# Patient Record
Sex: Female | Born: 1955
Health system: Southern US, Community
[De-identification: ages and names within clinical notes are randomized; demographics above are authoritative.]

## PROBLEM LIST (undated history)

## (undated) DIAGNOSIS — E119 Type 2 diabetes mellitus without complications: Secondary | ICD-10-CM

## (undated) DIAGNOSIS — M1990 Unspecified osteoarthritis, unspecified site: Secondary | ICD-10-CM

## (undated) DIAGNOSIS — H53132 Sudden visual loss, left eye: Secondary | ICD-10-CM

## (undated) DIAGNOSIS — M545 Low back pain, unspecified: Secondary | ICD-10-CM

## (undated) DIAGNOSIS — I10 Essential (primary) hypertension: Secondary | ICD-10-CM

## (undated) DIAGNOSIS — E7849 Other hyperlipidemia: Secondary | ICD-10-CM

## (undated) DIAGNOSIS — M5431 Sciatica, right side: Secondary | ICD-10-CM

## (undated) DIAGNOSIS — E669 Obesity, unspecified: Secondary | ICD-10-CM

## (undated) DIAGNOSIS — E11319 Type 2 diabetes mellitus with unspecified diabetic retinopathy without macular edema: Secondary | ICD-10-CM

## (undated) DIAGNOSIS — I639 Cerebral infarction, unspecified: Secondary | ICD-10-CM

## (undated) DIAGNOSIS — E785 Hyperlipidemia, unspecified: Secondary | ICD-10-CM

## (undated) DIAGNOSIS — G8929 Other chronic pain: Secondary | ICD-10-CM

## (undated) DIAGNOSIS — M549 Dorsalgia, unspecified: Secondary | ICD-10-CM

## (undated) HISTORY — DX: Other chronic pain: G89.29

## (undated) HISTORY — DX: Cerebral infarction, unspecified: I63.9

## (undated) HISTORY — DX: Unspecified osteoarthritis, unspecified site: M19.90

## (undated) HISTORY — DX: Other hyperlipidemia: E78.49

## (undated) HISTORY — DX: Sudden visual loss, left eye: H53.132

## (undated) HISTORY — PX: OTHER SURGICAL HISTORY: SHX169

## (undated) HISTORY — DX: Dorsalgia, unspecified: M54.9

## (undated) HISTORY — DX: Obesity, unspecified: E66.9

## (undated) HISTORY — DX: Sciatica, right side: M54.31

## (undated) HISTORY — DX: Type 2 diabetes mellitus without complications: E11.9

## (undated) HISTORY — DX: Hyperlipidemia, unspecified: E78.5

## (undated) HISTORY — DX: Essential (primary) hypertension: I10

## (undated) HISTORY — DX: Type 2 diabetes mellitus with unspecified diabetic retinopathy without macular edema: E11.319

## (undated) HISTORY — DX: Low back pain, unspecified: M54.50

---
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
## 2001-03-06 ENCOUNTER — Emergency Department (HOSPITAL_COMMUNITY): Admission: EM | Admit: 2001-03-06 | Discharge: 2001-03-06 | Payer: Self-pay | Admitting: Emergency Medicine

## 2001-03-06 ENCOUNTER — Encounter: Payer: Self-pay | Admitting: Emergency Medicine

## 2001-10-21 ENCOUNTER — Ambulatory Visit (HOSPITAL_COMMUNITY): Admission: RE | Admit: 2001-10-21 | Discharge: 2001-10-21 | Payer: Self-pay | Admitting: Pulmonary Disease

## 2002-12-14 ENCOUNTER — Other Ambulatory Visit: Admission: RE | Admit: 2002-12-14 | Discharge: 2002-12-14 | Payer: Self-pay | Admitting: Obstetrics and Gynecology

## 2002-12-22 ENCOUNTER — Encounter: Payer: Self-pay | Admitting: Obstetrics and Gynecology

## 2002-12-22 ENCOUNTER — Encounter: Admission: RE | Admit: 2002-12-22 | Discharge: 2002-12-22 | Payer: Self-pay | Admitting: Obstetrics and Gynecology

## 2003-09-03 ENCOUNTER — Ambulatory Visit (HOSPITAL_COMMUNITY): Admission: RE | Admit: 2003-09-03 | Discharge: 2003-09-03 | Payer: Self-pay | Admitting: Pulmonary Disease

## 2003-12-06 ENCOUNTER — Ambulatory Visit (HOSPITAL_COMMUNITY): Admission: RE | Admit: 2003-12-06 | Discharge: 2003-12-06 | Payer: Self-pay | Admitting: Orthopedic Surgery

## 2004-01-15 ENCOUNTER — Other Ambulatory Visit: Admission: RE | Admit: 2004-01-15 | Discharge: 2004-01-15 | Payer: Self-pay | Admitting: Obstetrics and Gynecology

## 2004-10-12 HISTORY — PX: COLONOSCOPY: SHX174

## 2004-10-12 HISTORY — PX: ESOPHAGOGASTRODUODENOSCOPY: SHX1529

## 2005-09-01 LAB — HM COLONOSCOPY

## 2006-01-27 ENCOUNTER — Emergency Department (HOSPITAL_COMMUNITY): Admission: EM | Admit: 2006-01-27 | Discharge: 2006-01-28 | Payer: Self-pay | Admitting: Emergency Medicine

## 2006-11-29 ENCOUNTER — Emergency Department (HOSPITAL_COMMUNITY): Admission: EM | Admit: 2006-11-29 | Discharge: 2006-11-29 | Payer: Self-pay | Admitting: Emergency Medicine

## 2007-03-08 ENCOUNTER — Emergency Department (HOSPITAL_COMMUNITY): Admission: EM | Admit: 2007-03-08 | Discharge: 2007-03-08 | Payer: Self-pay | Admitting: Emergency Medicine

## 2007-03-16 ENCOUNTER — Ambulatory Visit (HOSPITAL_COMMUNITY): Admission: RE | Admit: 2007-03-16 | Discharge: 2007-03-16 | Payer: Self-pay | Admitting: Pulmonary Disease

## 2007-04-18 ENCOUNTER — Encounter: Admission: RE | Admit: 2007-04-18 | Discharge: 2007-04-18 | Payer: Self-pay | Admitting: Endocrinology

## 2008-01-10 ENCOUNTER — Ambulatory Visit: Payer: Self-pay | Admitting: Family Medicine

## 2008-01-10 ENCOUNTER — Encounter (INDEPENDENT_AMBULATORY_CARE_PROVIDER_SITE_OTHER): Payer: Self-pay | Admitting: *Deleted

## 2008-01-10 DIAGNOSIS — M1990 Unspecified osteoarthritis, unspecified site: Secondary | ICD-10-CM | POA: Insufficient documentation

## 2008-01-10 DIAGNOSIS — G5600 Carpal tunnel syndrome, unspecified upper limb: Secondary | ICD-10-CM

## 2008-01-10 DIAGNOSIS — E1159 Type 2 diabetes mellitus with other circulatory complications: Secondary | ICD-10-CM

## 2008-01-10 DIAGNOSIS — I10 Essential (primary) hypertension: Secondary | ICD-10-CM | POA: Insufficient documentation

## 2008-01-10 DIAGNOSIS — E782 Mixed hyperlipidemia: Secondary | ICD-10-CM

## 2008-01-18 LAB — CONVERTED CEMR LAB
AST: 16 units/L (ref 0–37)
Albumin: 3.9 g/dL (ref 3.5–5.2)
BUN: 11 mg/dL (ref 6–23)
Bilirubin, Direct: 0.1 mg/dL (ref 0.0–0.3)
Calcium: 8.8 mg/dL (ref 8.4–10.5)
Chloride: 99 meq/L (ref 96–112)
Creatinine, Ser: 0.6 mg/dL (ref 0.4–1.2)
Direct LDL: 139.6 mg/dL
GFR calc Af Amer: 135 mL/min
Glucose, Bld: 327 mg/dL — ABNORMAL HIGH (ref 70–99)
HDL: 35.4 mg/dL — ABNORMAL LOW (ref >39.0)
Hgb A1c MFr Bld: 12.3 % — ABNORMAL HIGH (ref 4.6–6.0)
Microalb, Ur: 1.5 mg/dL (ref 0.0–1.9)
Total Bilirubin: 0.8 mg/dL (ref 0.3–1.2)
Total Protein: 7.5 g/dL (ref 6.0–8.3)
Triglycerides: 170 mg/dL — ABNORMAL HIGH (ref 0–149)

## 2008-01-25 ENCOUNTER — Ambulatory Visit: Payer: Self-pay | Admitting: Family Medicine

## 2008-01-31 ENCOUNTER — Telehealth: Payer: Self-pay | Admitting: Family Medicine

## 2008-02-01 ENCOUNTER — Ambulatory Visit: Payer: Self-pay | Admitting: Family Medicine

## 2008-02-23 ENCOUNTER — Telehealth: Payer: Self-pay | Admitting: Family Medicine

## 2008-03-16 ENCOUNTER — Telehealth: Payer: Self-pay | Admitting: Family Medicine

## 2008-04-26 ENCOUNTER — Ambulatory Visit: Payer: Self-pay | Admitting: Family Medicine

## 2008-04-26 LAB — CONVERTED CEMR LAB
AST: 27 units/L (ref 0–37)
Alkaline Phosphatase: 44 units/L (ref 39–117)
CO2: 28 meq/L (ref 19–32)
Calcium: 9 mg/dL (ref 8.4–10.5)
Cholesterol: 164 mg/dL (ref 0–200)
GFR calc non Af Amer: 111 mL/min
Hgb A1c MFr Bld: 9.3 % — ABNORMAL HIGH (ref 4.6–6.0)
LDL Cholesterol: 117 mg/dL — ABNORMAL HIGH (ref 0–99)
Sodium: 136 meq/L (ref 135–145)
Triglycerides: 61 mg/dL (ref 0–149)

## 2008-05-02 ENCOUNTER — Ambulatory Visit: Payer: Self-pay | Admitting: Family Medicine

## 2008-05-02 LAB — CONVERTED CEMR LAB: Cholesterol, target level: 200 mg/dL

## 2008-05-04 ENCOUNTER — Telehealth: Payer: Self-pay | Admitting: Family Medicine

## 2008-05-04 ENCOUNTER — Encounter (INDEPENDENT_AMBULATORY_CARE_PROVIDER_SITE_OTHER): Payer: Self-pay | Admitting: *Deleted

## 2008-05-07 ENCOUNTER — Ambulatory Visit (HOSPITAL_COMMUNITY): Admission: RE | Admit: 2008-05-07 | Discharge: 2008-05-07 | Payer: Self-pay | Admitting: Pulmonary Disease

## 2008-06-15 ENCOUNTER — Telehealth: Payer: Self-pay | Admitting: Family Medicine

## 2008-07-26 ENCOUNTER — Ambulatory Visit: Payer: Self-pay | Admitting: Family Medicine

## 2008-07-31 ENCOUNTER — Ambulatory Visit (HOSPITAL_COMMUNITY): Admission: RE | Admit: 2008-07-31 | Discharge: 2008-07-31 | Payer: Self-pay | Admitting: Pulmonary Disease

## 2008-08-02 LAB — CONVERTED CEMR LAB
BUN: 9 mg/dL (ref 6–23)
CO2: 30 meq/L (ref 19–32)
Calcium: 8.8 mg/dL (ref 8.4–10.5)
GFR calc Af Amer: 113 mL/min
Glucose, Bld: 129 mg/dL — ABNORMAL HIGH (ref 70–99)
HDL: 37.9 mg/dL — ABNORMAL LOW (ref >39.0)
LDL Cholesterol: 111 mg/dL — ABNORMAL HIGH (ref 0–99)
Potassium: 3.9 meq/L (ref 3.5–5.1)
Sodium: 138 meq/L (ref 135–145)
Total Bilirubin: 0.7 mg/dL (ref 0.3–1.2)
VLDL: 24 mg/dL (ref 0–40)

## 2008-08-08 ENCOUNTER — Ambulatory Visit: Payer: Self-pay | Admitting: Family Medicine

## 2008-10-31 ENCOUNTER — Ambulatory Visit: Payer: Self-pay | Admitting: Family Medicine

## 2008-11-05 LAB — CONVERTED CEMR LAB
ALT: 19 units/L (ref 0–35)
AST: 16 units/L (ref 0–37)
Albumin: 3.6 g/dL (ref 3.5–5.2)
BUN: 9 mg/dL (ref 6–23)
Bilirubin, Direct: 0.1 mg/dL (ref 0.0–0.3)
CO2: 29 meq/L (ref 19–32)
Calcium: 8.6 mg/dL (ref 8.4–10.5)
Creatinine, Ser: 0.6 mg/dL (ref 0.4–1.2)
GFR calc Af Amer: 134 mL/min
Glucose, Bld: 186 mg/dL — ABNORMAL HIGH (ref 70–99)
Sodium: 138 meq/L (ref 135–145)
Total CHOL/HDL Ratio: 6

## 2008-11-06 ENCOUNTER — Ambulatory Visit: Payer: Self-pay | Admitting: Family Medicine

## 2009-01-09 ENCOUNTER — Encounter: Admission: RE | Admit: 2009-01-09 | Discharge: 2009-01-09 | Payer: Self-pay | Admitting: Family Medicine

## 2009-01-10 ENCOUNTER — Encounter (INDEPENDENT_AMBULATORY_CARE_PROVIDER_SITE_OTHER): Payer: Self-pay | Admitting: *Deleted

## 2009-01-30 ENCOUNTER — Ambulatory Visit: Payer: Self-pay | Admitting: Family Medicine

## 2009-02-01 LAB — CONVERTED CEMR LAB
Alkaline Phosphatase: 43 units/L (ref 39–117)
BUN: 10 mg/dL (ref 6–23)
Chloride: 104 meq/L (ref 96–112)
Creatinine, Ser: 0.6 mg/dL (ref 0.4–1.2)
GFR calc non Af Amer: 133.98 mL/min (ref >60)
Glucose, Bld: 82 mg/dL (ref 70–99)
Hgb A1c MFr Bld: 8.4 % — ABNORMAL HIGH (ref 4.6–6.5)
Microalb Creat Ratio: 3.1 mg/g (ref 0.0–30.0)
Total Bilirubin: 0.9 mg/dL (ref 0.3–1.2)
Total CHOL/HDL Ratio: 6
Total Protein: 7.1 g/dL (ref 6.0–8.3)

## 2009-02-06 ENCOUNTER — Ambulatory Visit: Payer: Self-pay | Admitting: Family Medicine

## 2009-02-06 DIAGNOSIS — F341 Dysthymic disorder: Secondary | ICD-10-CM

## 2009-02-12 ENCOUNTER — Encounter: Payer: Self-pay | Admitting: Family Medicine

## 2009-02-12 ENCOUNTER — Telehealth: Payer: Self-pay | Admitting: Family Medicine

## 2009-02-20 ENCOUNTER — Ambulatory Visit (HOSPITAL_COMMUNITY): Payer: Self-pay | Admitting: Psychiatry

## 2009-02-25 ENCOUNTER — Ambulatory Visit (HOSPITAL_COMMUNITY): Payer: Self-pay | Admitting: Psychiatry

## 2009-03-06 ENCOUNTER — Ambulatory Visit: Payer: Self-pay | Admitting: Family Medicine

## 2009-03-07 ENCOUNTER — Ambulatory Visit (HOSPITAL_COMMUNITY): Payer: Self-pay | Admitting: Psychiatry

## 2009-03-18 ENCOUNTER — Ambulatory Visit (HOSPITAL_COMMUNITY): Payer: Self-pay | Admitting: Psychiatry

## 2009-03-27 ENCOUNTER — Ambulatory Visit (HOSPITAL_COMMUNITY): Payer: Self-pay | Admitting: Psychiatry

## 2009-04-09 ENCOUNTER — Ambulatory Visit (HOSPITAL_COMMUNITY): Payer: Self-pay | Admitting: Psychiatry

## 2009-04-23 ENCOUNTER — Ambulatory Visit (HOSPITAL_COMMUNITY): Payer: Self-pay | Admitting: Psychiatry

## 2009-05-01 ENCOUNTER — Ambulatory Visit: Payer: Self-pay | Admitting: Family Medicine

## 2009-05-01 LAB — CONVERTED CEMR LAB
ALT: 20 units/L (ref 0–35)
CO2: 31 meq/L (ref 19–32)
Calcium: 8.7 mg/dL (ref 8.4–10.5)
Chloride: 107 meq/L (ref 96–112)
Creatinine, Ser: 0.6 mg/dL (ref 0.4–1.2)
Direct LDL: 160.1 mg/dL
GFR calc non Af Amer: 133.86 mL/min (ref >60)
Hgb A1c MFr Bld: 8.7 % — ABNORMAL HIGH (ref 4.6–6.5)
Total Protein: 7 g/dL (ref 6.0–8.3)
VLDL: 27.2 mg/dL (ref 0.0–40.0)

## 2009-05-08 ENCOUNTER — Ambulatory Visit: Payer: Self-pay | Admitting: Family Medicine

## 2009-05-14 ENCOUNTER — Ambulatory Visit (HOSPITAL_COMMUNITY): Payer: Self-pay | Admitting: Psychiatry

## 2009-05-29 ENCOUNTER — Ambulatory Visit (HOSPITAL_COMMUNITY): Payer: Self-pay | Admitting: Psychiatry

## 2009-06-14 ENCOUNTER — Ambulatory Visit (HOSPITAL_COMMUNITY): Payer: Self-pay | Admitting: Psychiatry

## 2009-07-01 ENCOUNTER — Ambulatory Visit (HOSPITAL_COMMUNITY): Payer: Self-pay | Admitting: Psychiatry

## 2009-07-22 ENCOUNTER — Ambulatory Visit (HOSPITAL_COMMUNITY): Payer: Self-pay | Admitting: Psychiatry

## 2009-08-02 ENCOUNTER — Ambulatory Visit: Payer: Self-pay | Admitting: Family Medicine

## 2009-08-05 LAB — CONVERTED CEMR LAB
AST: 20 units/L (ref 0–37)
Albumin: 3.8 g/dL (ref 3.5–5.2)
BUN: 9 mg/dL (ref 6–23)
Bilirubin, Direct: 0 mg/dL (ref 0.0–0.3)
CO2: 28 meq/L (ref 19–32)
Chloride: 102 meq/L (ref 96–112)
GFR calc non Af Amer: 133.73 mL/min (ref >60)
Glucose, Bld: 118 mg/dL — ABNORMAL HIGH (ref 70–99)
Sodium: 138 meq/L (ref 135–145)
Total Bilirubin: 0.8 mg/dL (ref 0.3–1.2)
Total CHOL/HDL Ratio: 4

## 2009-08-07 ENCOUNTER — Ambulatory Visit: Payer: Self-pay | Admitting: Family Medicine

## 2009-08-09 ENCOUNTER — Ambulatory Visit (HOSPITAL_COMMUNITY): Payer: Self-pay | Admitting: Psychiatry

## 2009-08-22 ENCOUNTER — Telehealth: Payer: Self-pay | Admitting: Family Medicine

## 2009-08-23 ENCOUNTER — Ambulatory Visit (HOSPITAL_COMMUNITY): Payer: Self-pay | Admitting: Psychiatry

## 2009-09-04 ENCOUNTER — Ambulatory Visit (HOSPITAL_COMMUNITY): Payer: Self-pay | Admitting: Psychiatry

## 2009-09-25 ENCOUNTER — Ambulatory Visit (HOSPITAL_COMMUNITY): Payer: Self-pay | Admitting: Psychiatry

## 2009-10-09 ENCOUNTER — Ambulatory Visit (HOSPITAL_COMMUNITY): Payer: Self-pay | Admitting: Psychiatry

## 2009-10-10 ENCOUNTER — Ambulatory Visit (HOSPITAL_COMMUNITY): Admission: RE | Admit: 2009-10-10 | Discharge: 2009-10-10 | Payer: Self-pay | Admitting: Pulmonary Disease

## 2009-11-01 ENCOUNTER — Telehealth: Payer: Self-pay | Admitting: Family Medicine

## 2009-11-08 ENCOUNTER — Encounter: Payer: Self-pay | Admitting: Family Medicine

## 2009-11-08 ENCOUNTER — Ambulatory Visit: Payer: Self-pay | Admitting: Family Medicine

## 2009-11-15 ENCOUNTER — Ambulatory Visit: Payer: Self-pay | Admitting: Family Medicine

## 2009-12-10 ENCOUNTER — Ambulatory Visit (HOSPITAL_COMMUNITY): Payer: Self-pay | Admitting: Psychiatry

## 2009-12-26 ENCOUNTER — Ambulatory Visit (HOSPITAL_COMMUNITY): Payer: Self-pay | Admitting: Psychiatry

## 2010-01-10 ENCOUNTER — Ambulatory Visit (HOSPITAL_COMMUNITY): Admission: RE | Admit: 2010-01-10 | Discharge: 2010-01-10 | Payer: Self-pay | Admitting: Pulmonary Disease

## 2010-01-10 ENCOUNTER — Encounter: Payer: Self-pay | Admitting: Family Medicine

## 2010-01-20 ENCOUNTER — Ambulatory Visit (HOSPITAL_COMMUNITY): Admission: RE | Admit: 2010-01-20 | Discharge: 2010-01-20 | Payer: Self-pay | Admitting: Pulmonary Disease

## 2010-01-20 ENCOUNTER — Encounter: Payer: Self-pay | Admitting: Family Medicine

## 2010-02-26 ENCOUNTER — Ambulatory Visit: Payer: Self-pay | Admitting: Family Medicine

## 2010-03-05 LAB — CONVERTED CEMR LAB
ALT: 18 units/L (ref 0–35)
Albumin: 4.1 g/dL (ref 3.5–5.2)
BUN: 10 mg/dL (ref 6–23)
Bilirubin, Direct: 0.1 mg/dL (ref 0.0–0.3)
Cholesterol: 224 mg/dL — ABNORMAL HIGH (ref 0–200)
Creatinine, Ser: 0.7 mg/dL (ref 0.4–1.2)
Creatinine,U: 196.7 mg/dL
Direct LDL: 147.7 mg/dL
HDL: 41.2 mg/dL (ref >39.00)
Hgb A1c MFr Bld: 9.1 % — ABNORMAL HIGH (ref 4.6–6.5)
Microalb Creat Ratio: 1 mg/g (ref 0.0–30.0)
Microalb, Ur: 2 mg/dL — ABNORMAL HIGH (ref 0.0–1.9)
Sodium: 139 meq/L (ref 135–145)
Total Bilirubin: 0.3 mg/dL (ref 0.3–1.2)
Total CHOL/HDL Ratio: 5
VLDL: 43.6 mg/dL — ABNORMAL HIGH (ref 0.0–40.0)

## 2010-03-07 ENCOUNTER — Ambulatory Visit: Payer: Self-pay | Admitting: Family Medicine

## 2010-03-14 ENCOUNTER — Encounter: Payer: Self-pay | Admitting: Family Medicine

## 2010-03-28 ENCOUNTER — Telehealth: Payer: Self-pay | Admitting: Family Medicine

## 2010-04-09 ENCOUNTER — Ambulatory Visit (HOSPITAL_COMMUNITY): Payer: Self-pay | Admitting: Psychiatry

## 2010-04-18 ENCOUNTER — Ambulatory Visit (HOSPITAL_COMMUNITY): Admission: RE | Admit: 2010-04-18 | Discharge: 2010-04-18 | Payer: Self-pay | Admitting: Pulmonary Disease

## 2010-04-23 ENCOUNTER — Ambulatory Visit (HOSPITAL_COMMUNITY): Payer: Self-pay | Admitting: Psychiatry

## 2010-04-24 ENCOUNTER — Telehealth: Payer: Self-pay | Admitting: Family Medicine

## 2010-04-28 ENCOUNTER — Ambulatory Visit: Payer: Self-pay | Admitting: Family Medicine

## 2010-04-29 LAB — CONVERTED CEMR LAB
Albumin: 3.8 g/dL (ref 3.5–5.2)
Alkaline Phosphatase: 45 units/L (ref 39–117)
BUN: 13 mg/dL (ref 6–23)
Bilirubin, Direct: 0.1 mg/dL (ref 0.0–0.3)
CO2: 34 meq/L — ABNORMAL HIGH (ref 19–32)
Pro B Natriuretic peptide (BNP): 27.2 pg/mL (ref 0.0–100.0)
Total Protein: 7 g/dL (ref 6.0–8.3)

## 2010-05-06 ENCOUNTER — Ambulatory Visit (HOSPITAL_COMMUNITY): Payer: Self-pay | Admitting: Psychiatry

## 2010-05-21 ENCOUNTER — Ambulatory Visit: Payer: Self-pay | Admitting: Family Medicine

## 2010-05-23 LAB — CONVERTED CEMR LAB
Albumin: 3.4 g/dL — ABNORMAL LOW (ref 3.5–5.2)
Bilirubin, Direct: 0.1 mg/dL (ref 0.0–0.3)
CO2: 32 meq/L (ref 19–32)
Cholesterol: 202 mg/dL — ABNORMAL HIGH (ref 0–200)
Direct LDL: 141.4 mg/dL
HDL: 39.4 mg/dL (ref >39.00)
Hgb A1c MFr Bld: 10 % — ABNORMAL HIGH (ref 4.6–6.5)
Total CHOL/HDL Ratio: 5
Triglycerides: 141 mg/dL (ref 0.0–149.0)
VLDL: 28.2 mg/dL (ref 0.0–40.0)

## 2010-05-26 ENCOUNTER — Ambulatory Visit (HOSPITAL_COMMUNITY): Payer: Self-pay | Admitting: Psychiatry

## 2010-05-28 ENCOUNTER — Ambulatory Visit: Payer: Self-pay | Admitting: Family Medicine

## 2010-05-28 ENCOUNTER — Other Ambulatory Visit: Admission: RE | Admit: 2010-05-28 | Discharge: 2010-05-28 | Payer: Self-pay | Admitting: Family Medicine

## 2010-05-28 LAB — CONVERTED CEMR LAB: Pap Smear: NORMAL

## 2010-06-05 ENCOUNTER — Encounter (INDEPENDENT_AMBULATORY_CARE_PROVIDER_SITE_OTHER): Payer: Self-pay | Admitting: *Deleted

## 2010-06-05 LAB — CONVERTED CEMR LAB: Pap Smear: NEGATIVE

## 2010-11-13 NOTE — Letter (Signed)
Summary: Out of Work  [facility] at [facility]  [address] [place], [place] [address]   Phone: [phone]  Fax: [phone]    January 10, 2008   Employee:  Addilyn A Borboa    To Whom It May Concern:   For Medical reasons, please excuse the above named employee from work for the following dates:  Start:   01/10/2008  End:   01/10/2008  If you need additional information, please feel free to contact our office.         Sincerely,    Excell Seltzer, M.D.  AEB:lsf

## 2010-11-13 NOTE — Letter (Signed)
Summary: Out of Work  [facility] at [facility]  [address] [place], [place] [address]   Phone: [phone]  Fax: [phone]    November 08, 2009   Employee:  Tajah A Linker    To Whom It May Concern:   For Medical reasons, please excuse the above named employee from work for the following dates:  Start:   11/08/2009  End:   11/10/2009  If you need additional information, please feel free to contact our office.         Sincerely,    Linde Gillis CMA (AAMA)

## 2010-11-13 NOTE — Letter (Signed)
Summary: Results Follow up Letter  [place] at [facility]  [address] [place], [place] [address]   Phone: [phone]  Fax: [phone]    06/05/2010 MRN: [id number]     Stacy [name] [address] [place], [place]  [address]    Dear [name],  The following are the results of your recent test(s):  Test         Result    Pap Smear:        Normal ___x__  Not Normal _____ Comments:Repeat in 1 year ______________________________________________________ Cholesterol: LDL(Bad cholesterol):         Your goal is less than:         HDL (Good cholesterol):       Your goal is more than: Comments:  ______________________________________________________ Mammogram:        Normal _____  Not Normal _____ Comments:  ___________________________________________________________________ Hemoccult:        Normal _____  Not normal _______ Comments:    _____________________________________________________________________ Other Tests:    We routinely do not discuss normal results over the telephone.  If you desire a copy of the results, or you have any questions about this information we can discuss them at your next office visit.   Sincerely,  Kerby Nora MD

## 2010-11-13 NOTE — Letter (Signed)
Summary: Surgical Clearance/[facility]  Surgical Clearance/[facility]   Imported By: Lanelle Bal 03/20/2010 10:17:50  _____________________________________________________________________  External Attachment:    Type:   Image     Comment:   External Document

## 2010-11-13 NOTE — Progress Notes (Signed)
Summary: swelling in feet and legs  Phone Note Call from Patient Call back at [phone]   Caller: Patient Call For: Kerby Nora MD Summary of Call: Patient says that she has extreme swelling in legs and feet. She thinks this is because of the actos. She wants to know if she should try something different. Uses [facility] in [place].  Initial call taken by: Melody Comas,  April 24, 2010 1:18 PM  Follow-up for Phone Call        Please make her an appt to be seen... not sure swelling is related to actos.   Go to ER if SOB.  Follow-up by: Kerby Nora MD,  April 25, 2010 8:57 AM  Additional Follow-up for Phone Call Additional follow up Details #1::        Left message on cell phone [phone] for patient to return call.  DeShannon Smith CMA Duncan Dull)  April 25, 2010 3:22 PM   Advised pt, appt made for monday, pt knows to go to ER if sob. Additional Follow-up by: Lowella Petties CMA,  April 25, 2010 3:29 PM

## 2010-11-13 NOTE — Assessment & Plan Note (Signed)
Summary: CPX/DLO   Vital Signs:  Patient profile:   55 year old female Height:      62 inches Weight:      195.8 pounds BMI:     35.94 Temp:     98.2 degrees F oral Pulse rate:   76 / minute Pulse rhythm:   regular BP sitting:   140 / 90  (left arm) Cuff size:   large  Vitals Entered By: Benny Lennert CMA Duncan Dull) (Mar 07, 2010 8:10 AM)  History of Present Illness: Chief complaint cpx with out pap or breast exam because of reputured disc in back.Consuello Masse CMA   DM, poor control..A1C continues to be high.  Continues to be noncompliant with diet. On Lantus 45 UNits two times a day, Januvia daily. Fasting blood sugars...140-202,  post prandial only occ checks 160. no low CBGs.  Did not tolerate metformin well.  3/29 acute back pain with radicular symptoms. Saw Dr Juanetta Gosling previous MD. ... plain films show grade 1 slippage L4-L5 Minimal improvement...pain med. MRI 4/11... moderate severe spinal stenosis L4-L5  and disc herniation. Referred to Dr. Mikal Plane...treated with steroid taper...minimal improvement. Pain in buttocks radiating to legs currently..has been out of work since. Planning epidural injections...awaiting to be scheduled for these.  Lipid Management History:      Positive NCEP/ATP III risk factors include female age 36 years old or older, diabetes, and hypertension.  Negative NCEP/ATP III risk factors include non-tobacco-user status.        The patient states that she knows about the "Therapeutic Lifestyle Change" diet.  Her compliance with the TLC diet is poor.  Adjunctive measures started by the patient include aerobic exercise, fiber, sitostanol esters, limit alcohol consumpton, and weight reduction.  Comments include: Not taking meds regualrly..only occ taking crestor..was previously controlled with this med. .  The patient denies any symptoms to suggest myopathy or liver disease.     Problems Prior to Update: 1)  Sinusitis - Acute-nos  (ICD-461.9) 2)   Rotator Cuff Injury, Left Shoulder  (ICD-726.10) 3)  Skin Lesions, Multiple  (ICD-709.9) 4)  Anxiety Depression  (ICD-300.4) 5)  Cough  (ICD-786.2) 6)  Carpal Tunnel Syndrome, Bilateral  (ICD-354.0) 7)  Hypertension  (ICD-401.9) 8)  Hyperlipidemia  (ICD-272.4) 9)  Diabetes Mellitus, Type II  (ICD-250.00) 10)  Osteoarthritis  (ICD-715.90)  Current Medications (verified): 1)  Hydrochlorothiazide 25 Mg  Tabs (Hydrochlorothiazide) .... Take 1 Tablet By Mouth Once A Day 2)  Vitamin C 500 Mg  Tabs (Ascorbic Acid) .... Take 1 Tablet By Mouth Once A Day 3)  Vitamin E 600 Unit  Caps (Vitamin E) .... Take 1 Capsule By Mouth Once A Day 4)  Baby Aspirin 81 Mg  Chew (Aspirin) .... Take 1 Tablet By Mouth Once A Day 5)  Lantus Solostar 100 Unit/ml  Soln (Insulin Glargine) .... 45 Units Bid Dx 250.00 6)  Freestyle Test   Strp (Glucose Blood) .... Check Cbg Three Times Daily 7)  Crestor 20 Mg Tabs (Rosuvastatin Calcium) .Marland Kitchen.. 1 Tab By Mouth Daily 8)  Januvia 100 Mg Tabs (Sitagliptin Phosphate) .Marland Kitchen.. 1 Tab By Mouth Daily 9)  Nexium 40 Mg Cpdr (Esomeprazole Magnesium) .... Take 1 Tablet By Mouth Once A Day 10)  Bd Pen Needle Ultrafine 29g X 12.18mm Misc (Insulin Pen Needle) .... Give Insulin Two Times A Day  Dx 250.00 11)  Diclofenac Sodium 75 Mg Tbec (Diclofenac Sodium) .Marland Kitchen.. 1 Tab By Mouth Two Times A Day 12)  Sertraline Hcl 25 Mg  Tabs (Sertraline Hcl) .... Take 1 Tablet By Mouth Once A Day 13)  Alprazolam 0.25 Mg Tabs (Alprazolam) .Marland Kitchen.. 1 Tab By Mouth Daily As Needed Anxiety 14)  Amlodipine Besy-Benazepril Hcl 5-20 Mg Caps (Amlodipine Besy-Benazepril Hcl) .... Take One Tablet By Mouth Daily  Allergies (verified): No Known Drug Allergies  Past History:  Past medical, surgical, family and social histories (including risk factors) reviewed, and no changes noted (except as noted below).  Past Medical History: Reviewed history from 01/10/2008 and no changes required. Osteoarthritis Diabetes mellitus, type  II, Dx 2000 Hyperlipidemia Hypertension  Past Surgical History: Reviewed history from 01/10/2008 and no changes required. left wrist surgery 2006  Family History: Reviewed history from 01/10/2008 and no changes required. father: CAD, DM,  mother: CAD, CABG age 15, DM, arhtritis, HTN brother: ? PGM: CAD MGM: DM on insulin no cancer  Social History: Reviewed history from 01/10/2008 and no changes required. 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 CV:  Denies chest pain or discomfort. Resp:  Denies shortness of breath. GI:  Denies abdominal pain. GU:  Denies dysuria.  Physical Exam  General:  obese appearing femla ein NAD Mouth:  MMM Neck:  no carotid bruit or thyromegaly no cervical or supraclavicular lymphadenopathy  Lungs:  Normal respiratory effort, chest expands symmetrically. Lungs are clear to auscultation, no crackles or wheezes. Heart:  Normal rate and regular rhythm. S1 and S2 normal without gallop, murmur, click, rub or other extra sounds. Abdomen:  Bowel sounds positive,abdomen soft and non-tender without masses, organomegaly or hernias noted. Pulses:  R and L posterior tibial pulses are full and equal bilaterally  Extremities:    Varicosities at lower thigh, calves mildy tender to touch  Diabetes Management Exam:    Foot Exam (with socks and/or shoes not present):       Sensory-Pinprick/Light touch:          Left medial foot (L-4): normal          Left dorsal foot (L-5): normal          Left lateral foot (S-1): normal          Right medial foot (L-4): normal          Right dorsal foot (L-5): normal          Right lateral foot (S-1): normal       Sensory-Monofilament:          Left foot: normal          Right foot: normal       Inspection:          Left foot: abnormal              Comments: several red papules on sole of foot and  between toes..consistent with flea bite/nbug bites...no obvious infection treat with cortisone cream two times a day.           Right foot: normal       Nails:          Left foot: normal          Right foot: normal   Impression & Recommendations:  Problem # 1:  DIABETES MELLITUS, TYPE II (ICD-250.00)  Very poor control. pt remains very resistant. She seems to be waiting for everything else in her life to be stable before getting diabetes  and weight under control.  Counseled pt >30 min face to face..Pt already notified per report.  her she will have more issues...new microalbuminuria this year etc...if she keeps waiting.  Agree to increase Lantus given fastings now poorly controlled and start sctos daily.  Her updated medication list for this problem includes:    Baby Aspirin 81 Mg Chew (Aspirin) .Marland Kitchen... Take 1 tablet by mouth once a day    Lantus Solostar 100 Unit/ml Soln (Insulin glargine) .Marland KitchenMarland KitchenMarland KitchenMarland Kitchen 45 units bid dx 250.00    Januvia 100 Mg Tabs (Sitagliptin phosphate) .Marland Kitchen... 1 tab by mouth daily    Amlodipine Besy-benazepril Hcl 5-20 Mg Caps (Amlodipine besy-benazepril hcl) .Marland Kitchen... Take one tablet by mouth daily    Actos 30 Mg Tabs (Pioglitazone hcl) .Marland Kitchen... Take 1 tablet by mouth once a day  Labs Reviewed: Creat: 0.7 (02/26/2010)     Last Eye Exam: normal (12/10/2008) Reviewed HgBA1c results: 9.1 (02/26/2010)  8.5 (11/08/2009)  Problem # 2:  HYPERTENSION (ICD-401.9)  poor control given pain at this time. OCntinue to follow.  Her updated medication list for this problem includes:    Hydrochlorothiazide 25 Mg Tabs (Hydrochlorothiazide) .Marland Kitchen... Take 1 tablet by mouth once a day    Amlodipine Besy-benazepril Hcl 5-20 Mg Caps (Amlodipine besy-benazepril hcl) .Marland Kitchen... Take one tablet by mouth daily  BP today: 140/90 Prior BP: 130/90 (11/15/2009)  10 Yr Risk Heart Disease: 27 % Prior 10 Yr Risk Heart Disease: 24 % (08/07/2009)  Labs  Reviewed: K+: 4.2 (02/26/2010) Creat: : 0.7 (02/26/2010)   Chol: 224 (02/26/2010)   HDL: 41.20 (02/26/2010)   LDL: 102 (08/02/2009)   TG: 218.0 (02/26/2010)  Problem # 3:  HYPERLIPIDEMIA (ICD-272.4) needs to eb more compliant with medicaiton.  Her updated medication list for this problem includes:    Crestor 20 Mg Tabs (Rosuvastatin calcium) .Marland Kitchen... 1 tab by mouth daily  Labs Reviewed: SGOT: 15 (02/26/2010)   SGPT: 18 (02/26/2010)  Lipid Goals: Chol Goal: 200 (05/02/2008)   HDL Goal: 40 (05/02/2008)   LDL Goal: 100 (05/02/2008)   TG Goal: 150 (05/02/2008)  10 Yr Risk Heart Disease: 27 % Prior 10 Yr Risk Heart Disease: 24 % (08/07/2009)   HDL:41.20 (02/26/2010), 37.00 (08/02/2009)  LDL:102 (08/02/2009), 126 (01/30/2009)  Chol:224 (02/26/2010), 158 (08/02/2009)  Trig:218.0 (02/26/2010), 95.0 (08/02/2009)  Problem # 4:  Preventive Health Care (ICD-V70.0) Assessment: Comment Only Unable to do [facility] today given back pain...will reschedule.   Complete Medication List: 1)  Hydrochlorothiazide 25 Mg Tabs (Hydrochlorothiazide) .... Take 1 tablet by mouth once a day 2)  Vitamin C 500 Mg Tabs (Ascorbic acid) .... Take 1 tablet by mouth once a day 3)  Vitamin E 600 Unit Caps (Vitamin e) .... Take 1 capsule by mouth once a day 4)  Baby Aspirin 81 Mg Chew (Aspirin) .... Take 1 tablet by mouth once a day 5)  Lantus Solostar 100 Unit/ml Soln (Insulin glargine) .... 45 units bid dx 250.00 6)  Freestyle Test Strp (Glucose blood) .... Check cbg three times daily 7)  Crestor 20 Mg Tabs (Rosuvastatin calcium) .Marland Kitchen.. 1 tab by mouth daily 8)  Januvia 100 Mg Tabs (Sitagliptin phosphate) .Marland Kitchen.. 1 tab by mouth daily 9)  Nexium 40 Mg Cpdr (Esomeprazole magnesium) .... Take 1 tablet by mouth once a day 10)  Bd Pen Needle Ultrafine 29g X 12.24mm Misc (Insulin pen needle) .... Give insulin two times a day  dx 250.00 11)  Diclofenac Sodium 75 Mg Tbec (Diclofenac sodium) .Marland Kitchen.. 1 tab by mouth two times  a day 12)  Sertraline  Hcl 25 Mg Tabs (Sertraline hcl) .... Take 1 tablet by mouth once a day 13)  Alprazolam 0.25 Mg Tabs (Alprazolam) .Marland Kitchen.. 1 tab by mouth daily as needed anxiety 14)  Amlodipine Besy-benazepril Hcl 5-20 Mg Caps (Amlodipine besy-benazepril hcl) .... Take one tablet by mouth daily 15)  Actos 30 Mg Tabs (Pioglitazone hcl) .... Take 1 tablet by mouth once a day  Lipid Assessment/Plan:      Based on NCEP/ATP III, the patient's risk factor category is "history of diabetes".  The patient's lipid goals are as follows: Total cholesterol goal is 200; LDL cholesterol goal is 100; HDL cholesterol goal is 40; Triglyceride goal is 150.  Her LDL cholesterol goal has not been met.  Secondary causes for hyperlipidemia have been ruled out.  She has been counseled on adjunctive measures for lowering her cholesterol and has been provided with dietary instructions.    Patient Instructions: 1)  Increase Lantus to 47 Units twice daily.  2)  Reschedule CPX in 3 months. 3)  BMP prior to visit, ICD-9: 250.00 4)  Hepatic Panel prior to visit ICD-9:  5)  Lipid panel prior to visit ICD-9 :  6)  HgBA1c prior to visit  ICD-9:  7)  Start back crestor.  8)  Start actos 30 mg daily.  Prescriptions: ACTOS 30 MG TABS (PIOGLITAZONE HCL) Take 1 tablet by mouth once a day  #30 x 11   Entered and Authorized by:   Kerby Nora MD   Signed by:   Kerby Nora MD on 03/07/2010   Method used:   Electronically to        [facility] [phone]* (retail)       [address]       [place], [place]  [address]       Ph: [phone] or [phone]       Fax: [phone]   RxID:   [id number]   Current Allergies (reviewed today): No known allergies   Last PAP:  Normal (05/13/2007 8:51:13 AM) PAP Next Due:  Refused

## 2010-11-13 NOTE — Assessment & Plan Note (Signed)
Summary: FEET AND LEGS SWELLING   Vital Signs:  Patient profile:   55 year old female Weight:      199.25 pounds Temp:     98.9 degrees F oral Pulse rate:   86 / minute Pulse rhythm:   regular BP sitting:   154 / 80  (right arm) Cuff size:   large  Vitals Entered By: Janee Morn CMA (April 28, 2010 12:27 PM) CC: Leg and foot Edema   History of Present Illness: 54 year old female:  3 weeks, had soem epidural steroid injections and then really started to swell up then.  Did start high after injections  New med - actos 47 units two times a day of lantus  tthe patient presents with bilateral diffuse fatigue and lower extremity swelling has been ongoing for the last week or so.  She is discontinued multiple of her medications including her Actos which was recently started.  Patient also discontinued her hydrochlorothiazide, and she is now hypertensive. She started taking some Lasix on her own. Discontinue her hydrochlorothiazide because she felt as if she was not getting enough diuresis and concern about  swelling.   Poorly controlled diabetes.   Allergies (verified): No Known Drug Allergies  Past History:  Past medical, surgical, family and social histories (including risk factors) reviewed, and no changes noted (except as noted below).  Past Medical History: Reviewed history from 01/10/2008 and no changes required. Osteoarthritis Diabetes mellitus, type II, Dx 2000 Hyperlipidemia Hypertension  Past Surgical History: Reviewed history from 01/10/2008 and no changes required. left wrist surgery 2006  Family History: Reviewed history from 01/10/2008 and no changes required. father: CAD, DM,  mother: CAD, CABG age 71, DM, arhtritis, HTN brother: ? PGM: CAD MGM: DM on insulin no cancer  Social History: Reviewed history from 01/10/2008 and no changes required. 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 CV:  Complains of swelling of feet; denies chest pain or discomfort, difficulty breathing while lying down, palpitations, and shortness of breath with exertion.  Physical Exam  General:  Well-developed,well-nourished,in no acute distress; alert,appropriate and cooperative throughout examination Head:  Normocephalic and atraumatic without obvious abnormalities. No apparent alopecia or balding. Ears:  no external deformities.   Nose:  no external deformity.   Lungs:  normal respiratory effort.   Extremities:  3+ pedal edema and 2 + LE edema Neurologic:  alert & oriented X3 and gait normal.   Cervical Nodes:  No lymphadenopathy noted Psych:  Cognition and judgment appear intact. Alert and cooperative with normal attention span and concentration. No apparent delusions, illusions, hallucinations   Impression & Recommendations:  Problem # 1:  EDEMA (ICD-782.3) Assessment New eval BNP, BMP, liver  most likely from Actos  The following medications were removed from the medication list:    Hydrochlorothiazide 25 Mg Tabs (Hydrochlorothiazide) .Marland Kitchen... Take 1 tablet by mouth once a day Her updated medication list for this problem includes:    Hydrochlorothiazide 25 Mg Tabs (Hydrochlorothiazide) .Marland Kitchen... 1 by mouth daily  Orders: Venipuncture ([address]) TLB-BMP (Basic Metabolic Panel-BMET) (80048-METABOL) TLB-Hepatic/Liver Function Pnl (80076-HEPATIC) TLB-BNP (B-Natriuretic Peptide) (83880-BNPR)  Problem # 2:  HYPERTENSION (ICD-401.9) Assessment: New restart HCTZ  The following medications were removed from the medication list:    Hydrochlorothiazide 25 Mg Tabs (Hydrochlorothiazide) .Marland Kitchen... Take 1 tablet by mouth once a day Her  updated medication list for this problem includes:    Amlodipine Besy-benazepril Hcl 5-20 Mg Caps (Amlodipine besy-benazepril  hcl) .Marland Kitchen... Take one tablet by mouth daily    Hydrochlorothiazide 25 Mg Tabs (Hydrochlorothiazide) .Marland Kitchen... 1 by mouth daily  Problem # 3:  DIABETES MELLITUS, TYPE II (ICD-250.00) Patient will need more insulin.  If two times a day necessary vs. lantus + novolog, then 70/30 would be an option to think about  f/u with Dr. Ermalene Searing  The following medications were removed from the medication list:    Actos 30 Mg Tabs (Pioglitazone hcl) .Marland Kitchen... Take 1 tablet by mouth once a day Her updated medication list for this problem includes:    Baby Aspirin 81 Mg Chew (Aspirin) .Marland Kitchen... Take 1 tablet by mouth once a day    Lantus Solostar 100 Unit/ml Soln (Insulin glargine) .Marland KitchenMarland KitchenMarland KitchenMarland Kitchen 45 units bid dx 250.00    Januvia 100 Mg Tabs (Sitagliptin phosphate) .Marland Kitchen... 1 tab by mouth daily    Amlodipine Besy-benazepril Hcl 5-20 Mg Caps (Amlodipine besy-benazepril hcl) .Marland Kitchen... Take one tablet by mouth daily  Complete Medication List: 1)  Baby Aspirin 81 Mg Chew (Aspirin) .... Take 1 tablet by mouth once a day 2)  Lantus Solostar 100 Unit/ml Soln (Insulin glargine) .... 45 units bid dx 250.00 3)  Crestor 20 Mg Tabs (Rosuvastatin calcium) .Marland Kitchen.. 1 tab by mouth daily 4)  Januvia 100 Mg Tabs (Sitagliptin phosphate) .Marland Kitchen.. 1 tab by mouth daily 5)  Nexium 40 Mg Cpdr (Esomeprazole magnesium) .... Take 1 tablet by mouth once a day 6)  Sertraline Hcl 25 Mg Tabs (Sertraline hcl) .... Take 1 tablet by mouth once a day 7)  Alprazolam 0.25 Mg Tabs (Alprazolam) .Marland Kitchen.. 1 tab by mouth daily as needed anxiety 8)  Amlodipine Besy-benazepril Hcl 5-20 Mg Caps (Amlodipine besy-benazepril hcl) .... Take one tablet by mouth daily 9)  Bd Pen Needle Ultrafine 29g X 12.80mm Misc (Insulin pen needle) .... Give insulin two times a day  dx 250.00 10)  Freestyle Test Strp (Glucose blood) .... Check cbg three times daily 11)  Hydrochlorothiazide 25 Mg Tabs (Hydrochlorothiazide) .Marland Kitchen.. 1 by mouth daily  Patient Instructions: 1)  RESTART HYDROCHLOROTHIAZIDE 25 MG A  DAY 2)  WILL TAKE SEVERAL WEEKS FOR SWELLING TO RECOVER 3)  WE WILL CALL IF THE LABS ARE ABNORMAL AND MORE NEEDS TO BE DONE 4)  f/u with Dr. Ermalene Searing in 3 weeks 5)  check blood sugars, write down, fasting and 2 hours after eating and bring in to her  Current Allergies (reviewed today): No known allergies

## 2010-11-13 NOTE — Assessment & Plan Note (Signed)
Summary: sick/alc   Vital Signs:  Patient profile:   55 year old female Weight:      199.50 pounds BMI:     36.62 Temp:     98.6 degrees F oral Pulse rate:   72 / minute Pulse rhythm:   regular BP sitting:   140 / 84  (left arm) Cuff size:   large  Vitals Entered By: Linde Gillis CMA [facility]) (November 08, 2009 4:05 PM)   Acute Visit History:      The patient complains of cough, earache, headache, sinus problems, and sore throat.  These symptoms began 1 week ago.  She denies chest pain and fever.  Other comments include: clear nasal discharge PND OTC mucinex DM.        The cough interferes with her sleep.  The character of the cough is described as productive.  There is no history of shortness of breath associated with her cough.        The earache is located on the right side.        She complains of sinus pressure, ears being blocked, and nasal congestion.        Problems Prior to Update: 1)  Rotator Cuff Injury, Left Shoulder  (ICD-726.10) 2)  Skin Lesions, Multiple  (ICD-709.9) 3)  Anxiety Depression  (ICD-300.4) 4)  Cough  (ICD-786.2) 5)  Carpal Tunnel Syndrome, Bilateral  (ICD-354.0) 6)  Hypertension  (ICD-401.9) 7)  Hyperlipidemia  (ICD-272.4) 8)  Diabetes Mellitus, Type II  (ICD-250.00) 9)  Osteoarthritis  (ICD-715.90)  Current Medications (verified): 1)  Hydrochlorothiazide 25 Mg  Tabs (Hydrochlorothiazide) .... Take 1 Tablet By Mouth Once A Day 2)  Vitamin C 500 Mg  Tabs (Ascorbic Acid) .... Take 1 Tablet By Mouth Once A Day 3)  Vitamin E 600 Unit  Caps (Vitamin E) .... Take 1 Capsule By Mouth Once A Day 4)  Baby Aspirin 81 Mg  Chew (Aspirin) .... Take 1 Tablet By Mouth Once A Day 5)  Lantus Solostar 100 Unit/ml  Soln (Insulin Glargine) .... 45 Units Bid Dx 250.00 6)  Freestyle Test   Strp (Glucose Blood) .... Check Cbg Three Times Daily 7)  Crestor 20 Mg Tabs (Rosuvastatin Calcium) .Marland Kitchen.. 1 Tab By Mouth Daily 8)  Januvia 100 Mg Tabs (Sitagliptin Phosphate) .Marland Kitchen.. 1 Tab  By Mouth Daily 9)  Nexium 40 Mg Cpdr (Esomeprazole Magnesium) .... Take 1 Tablet By Mouth Once A Day 10)  Bd Pen Needle Ultrafine 29g X 12.15mm Misc (Insulin Pen Needle) .... Give Insulin Two Times A Day  Dx 250.00 11)  Diclofenac Sodium 75 Mg Tbec (Diclofenac Sodium) .Marland Kitchen.. 1 Tab By Mouth Two Times A Day 12)  Sertraline Hcl 25 Mg Tabs (Sertraline Hcl) .... Take 1 Tablet By Mouth Once A Day 13)  Alprazolam 0.25 Mg Tabs (Alprazolam) .Marland Kitchen.. 1 Tab By Mouth Daily As Needed Anxiety 14)  Hydrocodone-Homatropine 5-1.5 Mg/78ml Syrp (Hydrocodone-Homatropine) .Marland Kitchen.. 1 Tsp By Mouth At Bedtime As Needed Cough 15)  Amlodipine Besy-Benazepril Hcl 5-20 Mg Caps (Amlodipine Besy-Benazepril Hcl) .... Take One Tablet By Mouth Daily 16)  Amoxicillin 500 Mg Caps (Amoxicillin) .... 2 Tab By Mouth Two Times A Day X 10 Days  Allergies (verified): No Known Drug Allergies  Past History:  Past medical, surgical, family and social histories (including risk factors) reviewed, and no changes noted (except as noted below).  Past Medical History: Reviewed history from 01/10/2008 and no changes required. Osteoarthritis Diabetes mellitus, type II, Dx 2000 Hyperlipidemia Hypertension  Past Surgical  History: Reviewed history from 01/10/2008 and no changes required. left wrist surgery 2006  Family History: Reviewed history from 01/10/2008 and no changes required. father: CAD, DM,  mother: CAD, CABG age 38, DM, arhtritis, HTN brother: ? PGM: CAD MGM: DM on insulin no cancer  Social History: Reviewed history from 01/10/2008 and no changes required. 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 CV:  Denies chest pain or discomfort. GI:  Denies abdominal pain.  Physical Exam  General:  obese appearing female in NAD Head:  B maxilalrty  sinus tpt Ears:  Clear fluid B TMs, no eryhtmea, no exudate Nose:  nasal dischargemucosal pallor.   Mouth:  Oral mucosa and oropharynx without lesions or exudates.  MMM Neck:  no carotid bruit or thyromegaly no cervical or supraclavicular lymphadenopathy  Lungs:  Normal respiratory effort, chest expands symmetrically. Lungs are clear to auscultation, no crackles or wheezes. Heart:  Normal rate and regular rhythm. S1 and S2 normal without gallop, murmur, click, rub or other extra sounds.   Impression & Recommendations:  Problem # 1:  SINUSITIS - ACUTE-NOS (ICD-461.9) Treat with antibiotics, nasal saline irrigation and mucinex. Cough suppressant at night for cough due to post nasal drip.  Her updated medication list for this problem includes:    Hydrocodone-homatropine 5-1.5 Mg/40ml Syrp (Hydrocodone-homatropine) .Marland Kitchen... 1 tsp by mouth at bedtime as needed cough    Amoxicillin 500 Mg Caps (Amoxicillin) .Marland Kitchen... 2 tab by mouth two times a day x 10 days  Complete Medication List: 1)  Hydrochlorothiazide 25 Mg Tabs (Hydrochlorothiazide) .... Take 1 tablet by mouth once a day 2)  Vitamin C 500 Mg Tabs (Ascorbic acid) .... Take 1 tablet by mouth once a day 3)  Vitamin E 600 Unit Caps (Vitamin e) .... Take 1 capsule by mouth once a day 4)  Baby Aspirin 81 Mg Chew (Aspirin) .... Take 1 tablet by mouth once a day 5)  Lantus Solostar 100 Unit/ml Soln (Insulin glargine) .... 45 units bid dx 250.00 6)  Freestyle Test Strp (Glucose blood) .... Check cbg three times daily 7)  Crestor 20 Mg Tabs (Rosuvastatin calcium) .Marland Kitchen.. 1 tab by mouth daily 8)  Januvia 100 Mg Tabs (Sitagliptin phosphate) .Marland Kitchen.. 1 tab by mouth daily 9)  Nexium 40 Mg Cpdr (Esomeprazole magnesium) .... Take 1 tablet by mouth once a day 10)  Bd Pen Needle Ultrafine 29g X 12.4mm Misc (Insulin pen needle) .... Give insulin two times a day  dx 250.00 11)  Diclofenac Sodium 75 Mg Tbec (Diclofenac sodium) .Marland Kitchen.. 1 tab by mouth two times a day 12)   Sertraline Hcl 25 Mg Tabs (Sertraline hcl) .... Take 1 tablet by mouth once a day 13)  Alprazolam 0.25 Mg Tabs (Alprazolam) .Marland Kitchen.. 1 tab by mouth daily as needed anxiety 14)  Hydrocodone-homatropine 5-1.5 Mg/68ml Syrp (Hydrocodone-homatropine) .Marland Kitchen.. 1 tsp by mouth at bedtime as needed cough 15)  Amlodipine Besy-benazepril Hcl 5-20 Mg Caps (Amlodipine besy-benazepril hcl) .... Take one tablet by mouth daily 16)  Amoxicillin 500 Mg Caps (Amoxicillin) .... 2 tab by mouth two times a day x 10 days  Patient Instructions: 1)  Mucinex, nasal saline irrigation 3-4 times a day. 2)  Take your antibiotic as prescribed until ALL of it is gone, but stop if you develop a rash or swelling and contact our office as soon as possible.  Prescriptions: AMOXICILLIN 500 MG CAPS (AMOXICILLIN) 2 tab by mouth two times a day x 10 days  #40 x 0   Entered and Authorized by:   Kerby Nora MD   Signed by:   Kerby Nora MD on 11/08/2009   Method used:   Print then Give to Patient   RxID:   [id number] HYDROCODONE-HOMATROPINE 5-1.5 MG/5ML SYRP (HYDROCODONE-HOMATROPINE) 1 tsp by mouth at bedtime as needed cough  #8 oz x 0   Entered and Authorized by:   Kerby Nora MD   Signed by:   Kerby Nora MD on 11/08/2009   Method used:   Print then Give to Patient   RxID:   [id number]   Current Allergies (reviewed today): No known allergies

## 2010-11-13 NOTE — Assessment & Plan Note (Signed)
Summary: CPX/JRR   Vital Signs:  Patient profile:   55 year old female Height:      62 inches Weight:      199.2 pounds BMI:     36.57 Temp:     98.6 degrees F oral Pulse rate:   84 / minute Pulse rhythm:   regular BP sitting:   140 / 96  (left arm) Cuff size:   large  Vitals Entered By: Benny Lennert CMA [facility]) (May 28, 2010 10:54 AM)  History of Present Illness: Chief complaint cpx   The patient is here for annual wellness exam and preventative care.    Noncompliant with medication regimen...has stopped all meds except insulin and HCTZ.   DM, poor control ON Lantus solostar Pen 47 Units two times a day... FBS...70-135 2 hour post prandial....140-200s Diet is better since not working.   High cholesterol, poor control  HTN, poor control  Hypertension History:      She denies headache, chest pain, palpitations, dyspnea with exertion, peripheral edema, neurologic problems, and side effects from treatment.        Positive major cardiovascular risk factors include female age 101 years old or older, diabetes, hyperlipidemia, and hypertension.  Negative major cardiovascular risk factors include non-tobacco-user status.    Lipid Management History:      Positive NCEP/ATP III risk factors include female age 24 years old or older, diabetes, HDL cholesterol less than 40, and hypertension.  Negative NCEP/ATP III risk factors include non-tobacco-user status.        The patient states that she knows about the "Therapeutic Lifestyle Change" diet.  Her compliance with the TLC diet is poor.      Problems Prior to Update: 1)  Routine Gynecological Examination  (ICD-V72.31) 2)  Physical Examination  (ICD-V70.0) 3)  Edema- Localized  (ICD-782.3) 4)  Edema  (ICD-782.3) 5)  Sinusitis - Acute-nos  (ICD-461.9) 6)  Rotator Cuff Injury, Left Shoulder  (ICD-726.10) 7)  Skin Lesions, Multiple  (ICD-709.9) 8)  Anxiety Depression  (ICD-300.4) 9)  Cough  (ICD-786.2) 10)  Carpal Tunnel  Syndrome, Bilateral  (ICD-354.0) 11)  Hypertension  (ICD-401.9) 12)  Hyperlipidemia  (ICD-272.4) 13)  Diabetes Mellitus, Type II  (ICD-250.00) 14)  Osteoarthritis  (ICD-715.90)  Current Medications (verified): 1)  Baby Aspirin 81 Mg  Chew (Aspirin) .... Take 1 Tablet By Mouth Once A Day 2)  Lantus Solostar 100 Unit/ml  Soln (Insulin Glargine) .... 45 Units Bid Dx 250.00 3)  Crestor 20 Mg Tabs (Rosuvastatin Calcium) .Marland Kitchen.. 1 Tab By Mouth Daily 4)  Januvia 100 Mg Tabs (Sitagliptin Phosphate) .Marland Kitchen.. 1 Tab By Mouth Daily 5)  Nexium 40 Mg Cpdr (Esomeprazole Magnesium) .... Take 1 Tablet By Mouth Once A Day 6)  Sertraline Hcl 25 Mg Tabs (Sertraline Hcl) .... Take 1 Tablet By Mouth Once A Day 7)  Alprazolam 0.25 Mg Tabs (Alprazolam) .Marland Kitchen.. 1 Tab By Mouth Daily As Needed Anxiety 8)  Amlodipine Besy-Benazepril Hcl 5-20 Mg Caps (Amlodipine Besy-Benazepril Hcl) .... Take One Tablet By Mouth Daily 9)  Bd Pen Needle Ultrafine 29g X 12.97mm Misc (Insulin Pen Needle) .... Give Insulin Two Times A Day  Dx 250.00 10)  Freestyle Test   Strp (Glucose Blood) .... Check Cbg Three Times Daily 11)  Hydrochlorothiazide 25 Mg Tabs (Hydrochlorothiazide) .Marland Kitchen.. 1 By Mouth Daily  Allergies (verified): No Known Drug Allergies  Past History:  Past medical, surgical, family and social histories (including risk factors) reviewed, and no changes noted (except as noted below).  Past Medical History: Reviewed history from 01/10/2008 and no changes required. Osteoarthritis Diabetes mellitus, type II, Dx 2000 Hyperlipidemia Hypertension  Past Surgical History: Reviewed history from 01/10/2008 and no changes required. left wrist surgery 2006  Family History: Reviewed history from 01/10/2008 and no changes required. father: CAD, DM,  mother: CAD, CABG age 97, DM, arhtritis, HTN brother: ? PGM: CAD MGM: DM on insulin no cancer  Social History: Reviewed history from 01/10/2008 and no changes required. 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 CV:  Denies chest pain or discomfort. Resp:  Denies shortness of breath. GI:  Denies abdominal pain. GU:  Denies dysuria.  Physical Exam  General:  Well-developed,well-nourished,in no acute distress; alert,appropriate and cooperative throughout examination Mouth:  MMM Neck:  no carotid bruit or thyromegaly no cervical or supraclavicular lymphadenopathy  Chest Wall:  No deformities, masses, or tenderness noted. Breasts:  No mass, nodules, thickening, tenderness, bulging, retraction, inflamation, nipple discharge or skin changes noted.   Lungs:  normal respiratory effort.   Heart:  Normal rate and regular rhythm. S1 and S2 normal without gallop, murmur, click, rub or other extra sounds. Abdomen:  Bowel sounds positive,abdomen soft and non-tender without masses, organomegaly or hernias noted. Genitalia:  Pelvic Exam:        External: normal female genitalia without lesions or masses        Vagina: normal without lesions or masses        Cervix: normal without lesions or masses        Adnexa: normal bimanual exam without masses or fullness        Uterus: normal by palpation        Pap smear: performed Pulses:  R and L posterior tibial pulses are full and equal bilaterally  Extremities:  no edema  Diabetes Management Exam:    Foot Exam (with socks and/or shoes not present):       Sensory-Pinprick/Light touch:          Left medial foot (L-4): normal          Left dorsal foot (L-5): normal          Left lateral foot (S-1): normal          Right medial foot (L-4): normal          Right dorsal foot (L-5): normal          Right lateral foot (S-1): normal       Sensory-Monofilament:          Left foot: normal          Right foot: normal       Inspection:           Left foot: normal          Right foot: normal       Nails:          Left foot: normal          Right foot: normal   Impression & Recommendations:  Problem # 1:  Preventive Health Care (ICD-V70.0) The patient's preventative maintenance and recommended screening tests for an annual wellness exam were reviewed in full today. Brought up to date unless services declined.  Counselled on the importance of diet, exercise, and its role in overall health and mortality. The patient's FH and SH was reviewed, including  their home life, tobacco status, and drug and alcohol status.     Problem # 2:  Gynecological examination-routine (ICD-V72.31) PAP pending.   Problem # 3:  HYPERTENSION (ICD-401.9)  Poor control. Resstart amlodipine benazapril and continue HCTZ. Her updated medication list for this problem includes:    Amlodipine Besy-benazepril Hcl 5-20 Mg Caps (Amlodipine besy-benazepril hcl) .Marland Kitchen... Take one tablet by mouth daily    Hydrochlorothiazide 25 Mg Tabs (Hydrochlorothiazide) .Marland Kitchen... 1 by mouth daily  BP today: 140/96 Prior BP: 154/80 (04/28/2010)  Prior 10 Yr Risk Heart Disease: 27 % (03/07/2010)  Labs Reviewed: K+: 4.6 (05/21/2010) Creat: : 0.7 (05/21/2010)   Chol: 202 (05/21/2010)   HDL: 39.40 (05/21/2010)   LDL: 102 (08/02/2009)   TG: 141.0 (05/21/2010)  Problem # 4:  HYPERLIPIDEMIA (ICD-272.4)  Poor control. Refuses statin for a while... WIll work on lifestyle change.  Reeval in 3 months. The following medications were removed from the medication list:    Crestor 20 Mg Tabs (Rosuvastatin calcium) .Marland Kitchen... 1 tab by mouth daily  Labs Reviewed: SGOT: 14 (05/21/2010)   SGPT: 15 (05/21/2010)  Lipid Goals: Chol Goal: 200 (05/02/2008)   HDL Goal: 40 (05/02/2008)   LDL Goal: 100 (05/02/2008)   TG Goal: 150 (05/02/2008)  Prior 10 Yr Risk Heart Disease: 27 % (03/07/2010)   HDL:39.40 (05/21/2010), 41.20 (02/26/2010)  LDL:102 (08/02/2009), 126 (01/30/2009)  Chol:202 (05/21/2010), 224  (02/26/2010)  Trig:141.0 (05/21/2010), 218.0 (02/26/2010)  Complete Medication List: 1)  Baby Aspirin 81 Mg Chew (Aspirin) .... Take 1 tablet by mouth once a day 2)  Lantus Solostar 100 Unit/ml Soln (Insulin glargine) .... 45 units bid dx 250.00 3)  Amlodipine Besy-benazepril Hcl 5-20 Mg Caps (Amlodipine besy-benazepril hcl) .... Take one tablet by mouth daily 4)  Bd Pen Needle Ultrafine 29g X 12.61mm Misc (Insulin pen needle) .... Give insulin two times a day  dx 250.00 5)  Freestyle Test Strp (Glucose blood) .... Check cbg three times daily 6)  Hydrochlorothiazide 25 Mg Tabs (Hydrochlorothiazide) .Marland Kitchen.. 1 by mouth daily  Hypertension Assessment/Plan:      The patient's hypertensive risk group is category C: Target organ damage and/or diabetes.  Her calculated 10 year risk of coronary heart disease is 27 %.  Today's blood pressure is 140/96.  Her blood pressure goal is < 130/80.  Lipid Assessment/Plan:      Based on NCEP/ATP III, the patient's risk factor category is "history of diabetes".  The patient's lipid goals are as follows: Total cholesterol goal is 200; LDL cholesterol goal is 100; HDL cholesterol goal is 40; Triglyceride goal is 150.  Her LDL cholesterol goal has not been met.  Secondary causes for hyperlipidemia have been ruled out.  She has been counseled on adjunctive measures for lowering her cholesterol and has been provided with dietary instructions.    Patient Instructions: 1)  Call to set up mammogram. 2)  Please schedule a follow-up appointment in 3 months 30 min OV. 3)  Restart amlodipine benazapril. 4)   Call if interested in mealtime insulin dose or 70/30 option.  5)  Please schedule a follow-up appointment in 3 months .  6)  BMP prior to visit, ICD-9: 250.00 7)  Hepatic Panel prior to visit ICD-9:  8)  Lipid panel prior to visit ICD-9 :  9)  HgBA1c prior to visit  ICD-9:  Prescriptions: HYDROCHLOROTHIAZIDE 25 MG TABS (HYDROCHLOROTHIAZIDE) 1 by mouth daily  #90 x 3    Entered and Authorized by:   Kerby Nora MD  Signed by:   Kerby Nora MD on 05/28/2010   Method used:   Electronically to        [facility]. [phone]* (retail)       [address]       [place], [place]  [address]       Ph: [phone] or [phone]       Fax: [phone]   RxID:   [id number] AMLODIPINE BESY-BENAZEPRIL HCL 5-20 MG CAPS (AMLODIPINE BESY-BENAZEPRIL HCL) take one tablet by mouth daily  #90 x 3   Entered and Authorized by:   Kerby Nora MD   Signed by:   Kerby Nora MD on 05/28/2010   Method used:   Electronically to        [facility] [phone]* (retail)       [address]       [place], [place]  [address]       Ph: [phone] or [phone]       Fax: [phone]   RxID:   [id number]   Current Allergies (reviewed today): No known allergies   Last PAP:  Normal (05/13/2007 8:51:13 AM) PAP Result Date:  05/28/2010 PAP Result:  normal PAP Next Due:  1 yr Last Mammogram:  ASSESSMENT: Negative - BI-RADS 1^MM DIGITAL SCREENING (01/09/2009 1:00:00 PM) Mammogram Next Due:  1 yr never had pneumonia vaccine...refused today

## 2010-11-13 NOTE — Assessment & Plan Note (Signed)
Summary: roa 30 min resch  cyd   Vital Signs:  Patient profile:   55 year old female Weight:      197.50 pounds BMI:     36.25 Temp:     97.8 degrees F oral Pulse rate:   76 / minute Pulse rhythm:   regular BP sitting:   130 / 90  (left arm) Cuff size:   large  Vitals Entered By: Linde Gillis CMA Duncan Dull) (November 15, 2009 9:32 AM) CC: 30 minute appt, f/u   History of Present Illness: DM, poor control..A1C back to inadequate baseline.  Continues to be noncompliant with diet. On LAntus 45 UNits two times a day, JAnuvia daily. Fasting blood sugars...98-119,  post prandial only occ checks 211. Did not tolerate metformin well.   BAck to work full time. Has upcoming appt with specialist left shoulder...sees him MONday.  Not currently exercising.   Problems Prior to Update: 1)  Rotator Cuff Injury, Left Shoulder  (ICD-726.10) 2)  Skin Lesions, Multiple  (ICD-709.9) 3)  Anxiety Depression  (ICD-300.4) 4)  Cough  (ICD-786.2) 5)  Carpal Tunnel Syndrome, Bilateral  (ICD-354.0) 6)  Hypertension  (ICD-401.9) 7)  Hyperlipidemia  (ICD-272.4) 8)  Diabetes Mellitus, Type II  (ICD-250.00) 9)  Osteoarthritis  (ICD-715.90)  Current Medications (verified): 1)  Hydrochlorothiazide 25 Mg  Tabs (Hydrochlorothiazide) .... Take 1 Tablet By Mouth Once A Day 2)  Vitamin C 500 Mg  Tabs (Ascorbic Acid) .... Take 1 Tablet By Mouth Once A Day 3)  Vitamin E 600 Unit  Caps (Vitamin E) .... Take 1 Capsule By Mouth Once A Day 4)  Baby Aspirin 81 Mg  Chew (Aspirin) .... Take 1 Tablet By Mouth Once A Day 5)  Lantus Solostar 100 Unit/ml  Soln (Insulin Glargine) .... 45 Units Bid Dx 250.00 6)  Freestyle Test   Strp (Glucose Blood) .... Check Cbg Three Times Daily 7)  Crestor 20 Mg Tabs (Rosuvastatin Calcium) .Marland Kitchen.. 1 Tab By Mouth Daily 8)  Januvia 100 Mg Tabs (Sitagliptin Phosphate) .Marland Kitchen.. 1 Tab By Mouth Daily 9)  Nexium 40 Mg Cpdr (Esomeprazole Magnesium) .... Take 1 Tablet By Mouth Once A Day 10)  Bd Pen  Needle Ultrafine 29g X 12.48mm Misc (Insulin Pen Needle) .... Give Insulin Two Times A Day  Dx 250.00 11)  Diclofenac Sodium 75 Mg Tbec (Diclofenac Sodium) .Marland Kitchen.. 1 Tab By Mouth Two Times A Day 12)  Sertraline Hcl 25 Mg Tabs (Sertraline Hcl) .... Take 1 Tablet By Mouth Once A Day 13)  Alprazolam 0.25 Mg Tabs (Alprazolam) .Marland Kitchen.. 1 Tab By Mouth Daily As Needed Anxiety 14)  Hydrocodone-Homatropine 5-1.5 Mg/93ml Syrp (Hydrocodone-Homatropine) .Marland Kitchen.. 1 Tsp By Mouth At Bedtime As Needed Cough 15)  Amoxicillin 500 Mg Caps (Amoxicillin) .... 2 Tab By Mouth Two Times A Day X 10 Days  Allergies (verified): No Known Drug Allergies  Past History:  Past medical, surgical, family and social histories (including risk factors) reviewed, and no changes noted (except as noted below).  Past Medical History: Reviewed history from 01/10/2008 and no changes required. Osteoarthritis Diabetes mellitus, type II, Dx 2000 Hyperlipidemia Hypertension  Past Surgical History: Reviewed history from 01/10/2008 and no changes required. left wrist surgery 2006  Family History: Reviewed history from 01/10/2008 and no changes required. father: CAD, DM,  mother: CAD, CABG age 10, DM, arhtritis, HTN brother: ? PGM: CAD MGM: DM on insulin no cancer  Social History: Reviewed history from 01/10/2008 and no changes required. works nightshift: Research scientist (physical sciences) Divorced 1  daughter: healthy Never Smoked Alcohol use-no Drug use-no Regular exercise-yes, occ walking Diet: eating on the run, fast food  Review of Systems General:  Denies fatigue and fever. CV:  Denies chest pain or discomfort. Resp:  Denies shortness of breath. GI:  Denies abdominal pain. GU:  Denies dysuria.  Physical Exam  General:  obese appearing femla ein NAD Head:  no maxillary sinus ttp Eyes:  No corneal or conjunctival inflammation noted. EOMI. Perrla. Funduscopic exam benign, without hemorrhages, exudates or papilledema. Vision grossly  normal. Ears:  External ear exam shows no significant lesions or deformities.  Otoscopic examination reveals clear canals, tympanic membranes are intact bilaterally without bulging, retraction, inflammation or discharge. Hearing is grossly normal bilaterally. Nose:  External nasal examination shows no deformity or inflammation. Nasal mucosa are pink and moist without lesions or exudates. Mouth:  MMM Neck:  no carotid bruit or thyromegaly no cervical or supraclavicular lymphadenopathy  Lungs:  Normal respiratory effort, chest expands symmetrically. Lungs are clear to auscultation, no crackles or wheezes. Heart:  Normal rate and regular rhythm. S1 and S2 normal without gallop, murmur, click, rub or other extra sounds. Pulses:  R and L posterior tibial pulses are full and equal bilaterally  Extremities:    Varicosities at lower thigh, calves mildy tender to touch  Diabetes Management Exam:    Foot Exam (with socks and/or shoes not present):       Sensory-Pinprick/Light touch:          Left medial foot (L-4): normal          Left dorsal foot (L-5): normal          Left lateral foot (S-1): normal          Right medial foot (L-4): normal          Right dorsal foot (L-5): normal          Right lateral foot (S-1): normal       Sensory-Monofilament:          Left foot: normal          Right foot: normal       Inspection:          Left foot: normal          Right foot: normal       Nails:          Left foot: normal          Right foot: normal   Impression & Recommendations:  Problem # 1:  HYPERTENSION (ICD-401.9)  Well controlled at home . Continue current medication.  Her updated medication list for this problem includes:    Hydrochlorothiazide 25 Mg Tabs (Hydrochlorothiazide) .Marland Kitchen... Take 1 tablet by mouth once a day  BP today: 130/90 Prior BP: 140/80 (08/07/2009)  Prior 10 Yr Risk Heart Disease: 24 % (08/07/2009)  Labs Reviewed: K+: 4.1 (08/02/2009) Creat: : 0.6 (08/02/2009)    Chol: 158 (08/02/2009)   HDL: 37.00 (08/02/2009)   LDL: 102 (08/02/2009)   TG: 95.0 (08/02/2009)  Problem # 2:  DIABETES MELLITUS, TYPE II (ICD-250.00) Continued poor control. Noncompliance with diet and exercsie and weight loss is the main issue. Pt not open to adding additional meds..would rec eith adding Actos or better yet..fast acting mealtime insulin since post prandials are biggest issue.  She states.."give me 3 more months..I will get it under control..if not we can add a medicaiton."  Her updated medication list for this problem includes:    Baby Aspirin 81  Mg Chew (Aspirin) .Marland Kitchen... Take 1 tablet by mouth once a day    Lantus Solostar 100 Unit/ml Soln (Insulin glargine) .Marland KitchenMarland KitchenMarland KitchenMarland Kitchen 45 units bid dx 250.00    Januvia 100 Mg Tabs (Sitagliptin phosphate) .Marland Kitchen... 1 tab by mouth daily  Complete Medication List: 1)  Hydrochlorothiazide 25 Mg Tabs (Hydrochlorothiazide) .... Take 1 tablet by mouth once a day 2)  Vitamin C 500 Mg Tabs (Ascorbic acid) .... Take 1 tablet by mouth once a day 3)  Vitamin E 600 Unit Caps (Vitamin e) .... Take 1 capsule by mouth once a day 4)  Baby Aspirin 81 Mg Chew (Aspirin) .... Take 1 tablet by mouth once a day 5)  Lantus Solostar 100 Unit/ml Soln (Insulin glargine) .... 45 units bid dx 250.00 6)  Freestyle Test Strp (Glucose blood) .... Check cbg three times daily 7)  Crestor 20 Mg Tabs (Rosuvastatin calcium) .Marland Kitchen.. 1 tab by mouth daily 8)  Januvia 100 Mg Tabs (Sitagliptin phosphate) .Marland Kitchen.. 1 tab by mouth daily 9)  Nexium 40 Mg Cpdr (Esomeprazole magnesium) .... Take 1 tablet by mouth once a day 10)  Bd Pen Needle Ultrafine 29g X 12.58mm Misc (Insulin pen needle) .... Give insulin two times a day  dx 250.00 11)  Diclofenac Sodium 75 Mg Tbec (Diclofenac sodium) .Marland Kitchen.. 1 tab by mouth two times a day 12)  Sertraline Hcl 25 Mg Tabs (Sertraline hcl) .... Take 1 tablet by mouth once a day 13)  Alprazolam 0.25 Mg Tabs (Alprazolam) .Marland Kitchen.. 1 tab by mouth daily as needed anxiety 14)   Hydrocodone-homatropine 5-1.5 Mg/25ml Syrp (Hydrocodone-homatropine) .Marland Kitchen.. 1 tsp by mouth at bedtime as needed cough 15)  Amoxicillin 500 Mg Caps (Amoxicillin) .... 2 tab by mouth two times a day x 10 days  Patient Instructions: 1)  Please schedule a follow-up appointment in 3 months 30 min OV CPX.  2)  BMP prior to visit, ICD-9: 250.00 3)  Hepatic Panel prior to visit ICD-9:  4)  Lipid panel prior to visit ICD-9 :  5)  HgBA1c prior to visit  ICD-9:  6)  Urine Microalbumin prior to visit ICD-9 :   Current Allergies (reviewed today): No known allergies

## 2010-11-13 NOTE — Progress Notes (Signed)
Summary: requests sample  Phone Note Call from Patient   Caller: Patient Call For: Kerby Nora MD Summary of Call: Pt says she has been out of work for 3 months due to her back and she is asking for a lantus solostar sample. One given, sanofi aventis lot [id number], exp 04/2012. Initial call taken by: Lowella Petties CMA,  March 28, 2010 10:41 AM

## 2010-11-13 NOTE — Progress Notes (Signed)
Summary: Cough, congestion, sore throat  Phone Note Call from Patient   Caller: Patient Call For: Kerby Nora MD Summary of Call: Spoke with patient, stated that she has a cough, sore throat, and is congested.  Wanted to know what she can take, if something could be called in, or if she needs to be seen.  Concerned because she is a diabetic and has hypertension.  Uses [facility] Initial call taken by: Linde Gillis CMA Duncan Dull),  November 01, 2009 1:23 PM  Follow-up for Phone Call        Can use mucinex OTC, no decongestant.Marland Kitchenif cough at night bothersome..we can call if codeine guafenesin as below Follow-up by: Kerby Nora MD,  November 01, 2009 2:06 PM  Additional Follow-up for Phone Call Additional follow up Details #1::        rx called in and patient advised.Consuello Masse CMA  Additional Follow-up by: Benny Lennert CMA Duncan Dull),  November 01, 2009 2:13 PM    New/Updated Medications: GUAIFENESIN-CODEINE 100-10 MG/5ML SYRP (GUAIFENESIN-CODEINE) 1-2 tsp by mouth at bedtime as needed cough sugar free if available Prescriptions: GUAIFENESIN-CODEINE 100-10 MG/5ML SYRP (GUAIFENESIN-CODEINE) 1-2 tsp by mouth at bedtime as needed cough sugar free if available  #8 oz x 0   Entered and Authorized by:   Kerby Nora MD   Signed by:   Kerby Nora MD on 11/01/2009   Method used:   Telephoned to ...       [address] [phone]* (retail)       [address]       [place], [place]  [address]       Ph: [phone] or [phone]       Fax: [phone]   RxID:   [id number]

## 2010-11-25 ENCOUNTER — Other Ambulatory Visit (HOSPITAL_COMMUNITY): Payer: Self-pay | Admitting: Pulmonary Disease

## 2010-12-01 ENCOUNTER — Ambulatory Visit (HOSPITAL_COMMUNITY)
Admission: RE | Admit: 2010-12-01 | Discharge: 2010-12-01 | Disposition: A | Discharge status: Home / Self Care | Payer: Federal, State, Local not specified - PPO | Source: Ambulatory Visit | Attending: Pulmonary Disease | Admitting: Pulmonary Disease

## 2010-12-01 DIAGNOSIS — R109 Unspecified abdominal pain: Secondary | ICD-10-CM | POA: Insufficient documentation

## 2010-12-01 DIAGNOSIS — K769 Liver disease, unspecified: Secondary | ICD-10-CM | POA: Insufficient documentation

## 2011-01-26 ENCOUNTER — Encounter: Payer: Self-pay | Admitting: Internal Medicine

## 2011-01-29 ENCOUNTER — Other Ambulatory Visit: Payer: Self-pay | Admitting: *Deleted

## 2011-01-29 MED ORDER — INSULIN GLARGINE 100 UNIT/ML ~~LOC~~ SOLN: SUBCUTANEOUS | Status: DC

## 2011-02-02 ENCOUNTER — Ambulatory Visit (INDEPENDENT_AMBULATORY_CARE_PROVIDER_SITE_OTHER): Payer: Federal, State, Local not specified - PPO | Admitting: Gastroenterology

## 2011-02-02 ENCOUNTER — Encounter: Payer: Self-pay | Admitting: Gastroenterology

## 2011-02-02 VITALS — BP 157/87 | HR 72 | Temp 97.7°F | Ht 61.0 in | Wt 199.6 lb

## 2011-02-02 DIAGNOSIS — K219 Gastro-esophageal reflux disease without esophagitis: Secondary | ICD-10-CM

## 2011-02-02 DIAGNOSIS — Z1211 Encounter for screening for malignant neoplasm of colon: Secondary | ICD-10-CM

## 2011-02-02 DIAGNOSIS — K760 Fatty (change of) liver, not elsewhere classified: Secondary | ICD-10-CM

## 2011-02-02 DIAGNOSIS — Z1212 Encounter for screening for malignant neoplasm of rectum: Secondary | ICD-10-CM

## 2011-02-02 DIAGNOSIS — K7689 Other specified diseases of liver: Secondary | ICD-10-CM

## 2011-02-02 DIAGNOSIS — R1013 Epigastric pain: Secondary | ICD-10-CM

## 2011-02-02 MED ORDER — ESOMEPRAZOLE MAGNESIUM 40 MG PO CPDR: 40.0000 mg | DELAYED_RELEASE_CAPSULE | Freq: Every day | ORAL | Status: DC

## 2011-02-02 NOTE — Assessment & Plan Note (Signed)
She reports last colonoscopy was with Dr. Jeani Hawking around 2006. Will retrieve copy for our review so that we can make recommendations regarding timing of her next colonoscopy.

## 2011-02-02 NOTE — Assessment & Plan Note (Addendum)
3 month history of postprandial epigastric pain. Denies NSAID use. Takes aspirin 81 mg daily. Encouraged to use her PPI daily. Will actually add a second dose before her evening meal for the next 2 weeks to see if this improves her symptoms. Samples of Nexium provided to supplement her prescription of omeprazole. If she notes no significant improvement of her symptoms at 2 weeks would recommend EGD. I did offer her EGD at this time but she would like to try the medication.

## 2011-02-02 NOTE — Assessment & Plan Note (Signed)
Fatty infiltration of the liver seen on recent abdominal ultrasound in February 2012. Her LFTs last August were normal. She is due for blood work next week with Dr. Kerby Nora. Will followup on results when available. We provided her with printed information regarding fatty infiltration of the liver. Recommend 1-2# weight loss per week until ideal body weight through exercise & diet. Low fat/cholesterol diet. Gradually increase exercise from 15 min daily up to 1 hr per day 5 days/week. Limit alcohol use.

## 2011-02-02 NOTE — Assessment & Plan Note (Signed)
Previous EGD by Dr. Jeani Hawking. Will obtain copy for review. Continue PPI.

## 2011-02-02 NOTE — Progress Notes (Signed)
Primary Care Physician:  Fredirick Maudlin, MD  Primary Gastroenterologist:  Dr. Jonette Eva  Chief Complaint  Patient presents with  . Abdominal Pain    fatty liver    HPI:  Stacy [name] is a 55 y.o. female here at the request of Dr. Shaune Pollack for further evaluation of fatty liver and abdominal pain. Patient states she's had some LUQ pain for about three months. Happens in mid-day with meals. Does not wake up with it. Feels like gas pain. Eventually pain leaves. Sometimes nausea. BM regular. No melena, brbpr. Intermittent heartburn, depends on certain foods, takes omeprazole prn. Previously had been on Nexium but insurance would not cover. No dysphagia or odynophagia.   Labs are due 02/13/11 with Dr. Ermalene Searing. Between March and October of 2009 her weight increased from 166 pounds to 198 pounds. Patient relates this to change in medications for her diabetes. Patient's weight has been stable since that time.  Current Outpatient Prescriptions  Medication Sig Dispense Refill  . aspirin 81 MG tablet Take 81 mg by mouth daily.        . insulin glargine (LANTUS) 100 UNIT/ML injection Use 45 units twice a day or as directed.  10 mL  12  . lisinopril-hydrochlorothiazide (PRINZIDE,ZESTORETIC) 10-12.5 MG per tablet Take 1 tablet by mouth daily.        Marland Kitchen omeprazole (PRILOSEC) 20 MG capsule Take 20 mg by mouth daily.        Marland Kitchen esomeprazole (NEXIUM) 40 MG capsule Take 1 capsule (40 mg total) by mouth daily before supper.  15 capsule  11    Allergies as of 02/02/2011  . (No Known Allergies)    Past Medical History  Diagnosis Date  . HTN (hypertension)   . Hyperlipidemia   . Diabetes mellitus   . Osteoarthritis   . Chronic back pain     Past Surgical History  Procedure Date  . Carpel tunnel release     bilateral  . Cesarean section   . Colonoscopy 2006    normal, Dr. Elnoria Howard  . Esophagogastroduodenoscopy 2006    normal, Dr. Elnoria Howard    Family History  Problem Relation Age of Onset  . Liver  disease Neg Hx   . Diabetes Mother   . Diabetes Father   . Colon cancer Neg Hx   . GI problems Neg Hx     History   Social History  . Marital Status: Divorced    Spouse Name: N/A    Number of Children: N/A  . Years of Education: N/A   Occupational History  .  [employer]    out due to back injury   Social History Main Topics  . Smoking status: Never Smoker   . Smokeless tobacco: Not on file  . Alcohol Use: No  . Drug Use: No  . Sexually Active: No    ROS:  General: Negative for anorexia, weight loss, fever, chills, fatigue, weakness. Eyes: Negative for vision changes.  ENT: Negative for hoarseness, difficulty swallowing , nasal congestion. CV: Negative for chest pain, angina, palpitations, dyspnea on exertion, peripheral edema.  Respiratory: Negative for dyspnea at rest, dyspnea on exertion, cough, sputum, wheezing.  GI: See history of present illness. GU:  Negative for dysuria, hematuria, urinary incontinence, urinary frequency, nocturnal urination.  MS: Negative for joint pain. Positive for low back pain.  Derm: Negative for rash or itching.  Neuro: Negative for weakness, abnormal sensation, seizure, frequent headaches, memory loss, confusion.  Psych: Negative for anxiety, depression,  suicidal ideation, hallucinations.  Endo: Negative for unusual weight change.  Heme: Negative for bruising or bleeding. Allergy: Negative for rash or hives.    Physical Examination:  BP 157/87  Pulse 72  Temp(Src) 97.7 F (36.5 C) (Tympanic)  Ht 5\' 1"  (1.549 m)  Wt 199 lb 9.6 oz (90.538 kg)  BMI 37.71 kg/m2   General: Well-nourished, well-developed in no acute distress.  Head: Normocephalic, atraumatic.   Eyes: Conjunctiva pink, no icterus. Mouth: Oropharyngeal mucosa moist and pink , no lesions erythema or exudate. Neck: Supple without thyromegaly, masses, or lymphadenopathy.  Lungs: Clear to auscultation bilaterally.  Heart: Regular rate and rhythm, no murmurs rubs or  gallops.  Abdomen: Bowel sounds are normal, mild epigastric tenderness, nondistended, no hepatosplenomegaly or masses, no abdominal bruits or    hernia , no rebound or guarding.   Extremities: No lower extremity edema.  Neuro: Alert and oriented x 4 , grossly normal neurologically.  Skin: Warm and dry, no rash or jaundice.   Psych: Alert and cooperative, normal mood and affect.

## 2011-02-09 ENCOUNTER — Other Ambulatory Visit: Payer: Self-pay | Admitting: Family Medicine

## 2011-02-09 DIAGNOSIS — E7800 Pure hypercholesterolemia, unspecified: Secondary | ICD-10-CM

## 2011-02-09 DIAGNOSIS — I10 Essential (primary) hypertension: Secondary | ICD-10-CM

## 2011-02-13 ENCOUNTER — Other Ambulatory Visit (INDEPENDENT_AMBULATORY_CARE_PROVIDER_SITE_OTHER): Payer: Federal, State, Local not specified - PPO | Admitting: Family Medicine

## 2011-02-13 DIAGNOSIS — E7800 Pure hypercholesterolemia, unspecified: Secondary | ICD-10-CM

## 2011-02-13 DIAGNOSIS — E119 Type 2 diabetes mellitus without complications: Secondary | ICD-10-CM

## 2011-02-13 DIAGNOSIS — I10 Essential (primary) hypertension: Secondary | ICD-10-CM

## 2011-02-13 LAB — BASIC METABOLIC PANEL
BUN: 13 mg/dL (ref 6–23)
Calcium: 8.9 mg/dL (ref 8.4–10.5)
Creatinine, Ser: 0.8 mg/dL (ref 0.4–1.2)

## 2011-02-13 LAB — LIPID PANEL
Cholesterol: 189 mg/dL (ref 0–200)
HDL: 34.7 mg/dL — ABNORMAL LOW (ref >39.00)
LDL Cholesterol: 123 mg/dL — ABNORMAL HIGH (ref 0–99)
Total CHOL/HDL Ratio: 5
Triglycerides: 155 mg/dL — ABNORMAL HIGH (ref 0.0–149.0)

## 2011-02-13 LAB — HEPATIC FUNCTION PANEL
Bilirubin, Direct: 0 mg/dL (ref 0.0–0.3)
Total Bilirubin: 0.5 mg/dL (ref 0.3–1.2)

## 2011-02-19 ENCOUNTER — Telehealth: Payer: Self-pay

## 2011-02-19 NOTE — Telephone Encounter (Signed)
Called pt for PR. She said she is doing much better on the BID dosing of PPI (omeprazole/nexium). Said she still has a slight pain at times, but is much better. She wants to take another week and see how she does and she will call back to schedule EGD if having problems.

## 2011-02-20 ENCOUNTER — Encounter: Payer: Self-pay | Admitting: Family Medicine

## 2011-02-20 ENCOUNTER — Ambulatory Visit (INDEPENDENT_AMBULATORY_CARE_PROVIDER_SITE_OTHER): Payer: Federal, State, Local not specified - PPO | Admitting: Family Medicine

## 2011-02-20 DIAGNOSIS — K7689 Other specified diseases of liver: Secondary | ICD-10-CM

## 2011-02-20 DIAGNOSIS — I10 Essential (primary) hypertension: Secondary | ICD-10-CM

## 2011-02-20 DIAGNOSIS — R1013 Epigastric pain: Secondary | ICD-10-CM

## 2011-02-20 DIAGNOSIS — K760 Fatty (change of) liver, not elsewhere classified: Secondary | ICD-10-CM

## 2011-02-20 DIAGNOSIS — E785 Hyperlipidemia, unspecified: Secondary | ICD-10-CM

## 2011-02-20 DIAGNOSIS — E119 Type 2 diabetes mellitus without complications: Secondary | ICD-10-CM

## 2011-02-20 MED ORDER — LOSARTAN POTASSIUM-HCTZ 50-12.5 MG PO TABS: 1.0000 | ORAL_TABLET | Freq: Every day | ORAL | Status: DC

## 2011-02-20 NOTE — Telephone Encounter (Signed)
Noted  

## 2011-02-20 NOTE — Patient Instructions (Signed)
Call with blood sugar measurement in the next 1-2 week. Fasting and 2 hours after meals. Follow blood pressure at pharmacy... Call with measurements. Goal <130/80. Stop lisinopril/ HCTZ.

## 2011-02-20 NOTE — Progress Notes (Signed)
  Diabetes:  Poor control  Lab Results  Component Value Date   HGBA1C 10.4* 02/13/2011  Using medications : on Lantus 45 units BID.  She states that she had blood sugar well controlled before being placed on lisinopril/HCTZ in January.  Has been walking  Every other day.  Hypoglycemic episodes:none Hyperglycemic episodes: yes  Feet problems:none Blood Sugars averaging: FBS 189 eye exam within last year: none Has had 30 lb weight gain in last year.  Hypertension: poor control despite lisinopril/HCTZ.. Per pt Bp well controlled at home and other MD. Seeing Dr. Juanetta Gosling for back but he is also changing other meds!!! Using medication without problems or lightheadedness:  Chest pain with exertion: None Edema:None Short of breath:None Average home BPs: 130/80s  In 01/2011 Dr.Hawkins sent her for gallbladder US for abdominal pain. Showed fatty liver. No gallstones Saw GI MD Tana Coast PA).. Started on nexium and omeprazole together. Notes reviewed. Has been on these for 1 week, mild improvement  Elevated Cholesterol:inadequate control on NO medicaiton. Hesitant about statins. On no medicaitons Muscle aches:  Other complaints:  Has tickle in back of throat. Coughing a lot. Ongoing x 1 month. Occ runny nose, but not a lot of congestion.  No SOB, no chest pain.  PMH and SH reviewed.   Vital signs, Meds and allergies reviewed.  ROS: See HPI.  Otherwise nontributory.   GEN: nad, alert and oriented, central obesity HEENT: mucous membranes moist, nares clear, TMs clear B NECK: supple w/o LA CV: rrr.  no murmur PULM: ctab, no inc wob, no w/r/r ABD: soft, +bs EXT: no edema, no varicosities SKIN: no acute rash  Diabetic foot exam: Normal inspection No skin breakdown No calluses  Normal DP pulses Normal sensation to light tough and monofilament Nails normal

## 2011-02-24 NOTE — Letter (Signed)
April 17, 2009   Edward L. Juanetta Gosling, MD  [address]  [place], [place] [address]   Re:  Stacy, [name]                DOB:   Dear Dr. Juanetta Gosling:   As you know, we received the final report on the patient and it was  Mycobacterium avium complex.  I am not sure whether this needs to be  treated or not, but I will send you [name] copy or fax to you about it and  let you make the decision.  I appreciate the opportunity of seeing the  patient.   Sincerely,   Ines Bloomer, M.D.  Electronically Signed   DPB/MEDQ  D:  04/17/2009  T:  04/18/2009  Job:  [id number]

## 2011-02-24 NOTE — Procedures (Signed)
Stacy [name], Stacy [name]                ACCOUNT NO.:  [id number]   MEDICAL RECORD NO.:  [id number]          PATIENT TYPE:  OUT   LOCATION:  RESP                          FACILITY:  [facility]   PHYSICIAN:  Edward L. Juanetta Gosling, M.D.DATE OF BIRTH:  [birth year]   DATE OF PROCEDURE:  DATE OF DISCHARGE:                            PULMONARY FUNCTION TEST   1. There is no ventilatory defect.  There appears to be some mild      airflow obstruction at the level of the smaller airways.  2. Lung volumes show mild reduction in total lung capacity suggesting      mild restrictive change.  3. DLCO is normal.      Edward L. Juanetta Gosling, M.D.  Electronically Signed     ELH/MEDQ  D:  08/01/2008  T:  08/01/2008  Job:  [id number]

## 2011-02-27 NOTE — Assessment & Plan Note (Signed)
[facility]                                 ON-CALL NOTE   NAME:Beaver, VAUNDA                         MRN:          [id number]  DATE:03/17/2008                            DOB:          October 12, 1956    PHONE NUMBER:  [phone]   CALLER:  Mr. Jorja Loa from [place].   Question is about her Lantus SoloStar insulin.   The pharmacist states the patient was started at 25 units a day and told  to increase steadily as needed.  She is up to 61 units a day and needs a  new prescription because her monthly amount ran out.  I told him to go  ahead and give a prescription for Lantus insulin 61 units a day 11-month  supply with no refills, and will contact Dr. Ermalene Searing for further refills  next month when she knows what dose should be stabilized with.     Marne A. Tower, MD  Electronically Signed    MAT/MedQ  DD: 03/17/2008  DT: 03/18/2008  Job #: [id number]

## 2011-03-02 ENCOUNTER — Ambulatory Visit: Payer: Federal, State, Local not specified - PPO | Admitting: Family Medicine

## 2011-03-08 NOTE — Assessment & Plan Note (Signed)
IMproved with treatment of GERD.

## 2011-03-08 NOTE — Assessment & Plan Note (Signed)
Inadequate control, refuses statin medication.

## 2011-03-08 NOTE — Assessment & Plan Note (Signed)
Counseled pt on continued exercise and weight loss.

## 2011-03-08 NOTE — Assessment & Plan Note (Signed)
Above goal 130/80. Pt wishes to stop lisinopril/HCTZ which will likely only make BPs worse. Follow at home and call woith Bp measurements.

## 2011-03-08 NOTE — Assessment & Plan Note (Addendum)
Poor control. Pt resistant to med cahnge. She feel sCBG were getting better  Controlled before she started HCTZ/lisiopril..then CBG increased again. I told her I think this is doubtful, but in order to improve compliance we will stop the BP med and have Her call with CBGs and BPs.  in the next 1-2 week. Fasting and 2 hours after meals.

## 2011-03-13 NOTE — Progress Notes (Signed)
Received records from Dr. Jeani Hawking. EGD done on November 2006, she had a Schatzki ring. Colonoscopy done on November 2006, melanosis coli. Repeat colonoscopy recommended in November 2016.  Please note in computer for followup colonoscopy for November 2016.

## 2011-03-16 NOTE — Progress Notes (Signed)
Reminder in epic to have tcs in 08/2015

## 2011-05-26 ENCOUNTER — Other Ambulatory Visit (INDEPENDENT_AMBULATORY_CARE_PROVIDER_SITE_OTHER): Payer: Federal, State, Local not specified - PPO | Admitting: Family Medicine

## 2011-05-26 ENCOUNTER — Ambulatory Visit: Payer: Federal, State, Local not specified - PPO | Admitting: Family Medicine

## 2011-05-26 DIAGNOSIS — I10 Essential (primary) hypertension: Secondary | ICD-10-CM

## 2011-05-26 DIAGNOSIS — E785 Hyperlipidemia, unspecified: Secondary | ICD-10-CM

## 2011-05-26 DIAGNOSIS — E119 Type 2 diabetes mellitus without complications: Secondary | ICD-10-CM

## 2011-05-26 LAB — COMPREHENSIVE METABOLIC PANEL
ALT: 19 U/L (ref 0–35)
Albumin: 4 g/dL (ref 3.5–5.2)
CO2: 28 mEq/L (ref 19–32)
GFR: 116.96 mL/min (ref >60.00)
Glucose, Bld: 344 mg/dL — ABNORMAL HIGH (ref 70–99)
Potassium: 4.2 mEq/L (ref 3.5–5.1)
Sodium: 135 mEq/L (ref 135–145)
Total Protein: 7.4 g/dL (ref 6.0–8.3)

## 2011-05-26 LAB — HEMOGLOBIN A1C: Hgb A1c MFr Bld: 11.8 % — ABNORMAL HIGH (ref 4.6–6.5)

## 2011-06-04 ENCOUNTER — Encounter: Payer: Self-pay | Admitting: Family Medicine

## 2011-06-04 ENCOUNTER — Ambulatory Visit (INDEPENDENT_AMBULATORY_CARE_PROVIDER_SITE_OTHER): Payer: Federal, State, Local not specified - PPO | Admitting: Family Medicine

## 2011-06-04 DIAGNOSIS — E119 Type 2 diabetes mellitus without complications: Secondary | ICD-10-CM

## 2011-06-04 DIAGNOSIS — I10 Essential (primary) hypertension: Secondary | ICD-10-CM

## 2011-06-04 DIAGNOSIS — E785 Hyperlipidemia, unspecified: Secondary | ICD-10-CM

## 2011-06-04 MED ORDER — INSULIN NPH ISOPHANE & REGULAR (70-30) 100 UNIT/ML ~~LOC~~ SUSP: 30.0000 [IU] | Freq: Two times a day (BID) | SUBCUTANEOUS | Status: DC

## 2011-06-04 NOTE — Progress Notes (Signed)
  Subjective:    Patient ID: Stacy [name], female    DOB: 1956/10/10, 55 y.o.   MRN: [id number]  HPI  Diabetes: Very poor control. She has lost her insurance.. She has not been taking any Lantus. Using medications without difficulties: Hypoglycemic episodes:None Hyperglycemic episodes:Yes Feet problems:None Blood Sugars averaging: Checking daily  a day.  Last night 2hr ppd.. 227. eye exam within last year:   Elevated Cholesterol:Previously poor control.. Not at goal <100. Does not tolerate statin medicaiton.   Hypertension:  Diastolic not at goal today on  Hyzaar. Using medication without problems or lightheadedness:  Yes. Chest pain with exertion: None Edema:None Short of breath:None Average home BPs: Not checking at home, at other MD...123/77 Other issues:     Review of Systems  Constitutional: Negative for fever and fatigue.  HENT: Negative for ear pain.   Eyes: Negative for pain.  Respiratory: Negative for chest tightness and shortness of breath.   Cardiovascular: Negative for chest pain, palpitations and leg swelling.  Gastrointestinal: Negative for nausea, vomiting, abdominal pain, diarrhea, constipation, blood in stool and abdominal distention.  Genitourinary: Negative for dysuria.       Objective:   Physical Exam  Constitutional: Vital signs are normal. She appears well-developed and well-nourished. She is cooperative.  Non-toxic appearance. She does not appear ill. No distress.       obese  HENT:  Head: Normocephalic.  Right Ear: Hearing, tympanic membrane, external ear and ear canal normal. Tympanic membrane is not erythematous, not retracted and not bulging.  Left Ear: Hearing, tympanic membrane, external ear and ear canal normal. Tympanic membrane is not erythematous, not retracted and not bulging.  Nose: No mucosal edema or rhinorrhea. Right sinus exhibits no maxillary sinus tenderness and no frontal sinus tenderness. Left sinus exhibits no maxillary sinus  tenderness and no frontal sinus tenderness.  Mouth/Throat: Uvula is midline, oropharynx is clear and moist and mucous membranes are normal.  Eyes: Conjunctivae, EOM and lids are normal. Pupils are equal, round, and reactive to light. No foreign bodies found.  Neck: Trachea normal and normal range of motion. Neck supple. Carotid bruit is not present. No mass and no thyromegaly present.  Cardiovascular: Normal rate, regular rhythm, S1 normal, S2 normal, normal heart sounds, intact distal pulses and normal pulses.  Exam reveals no gallop and no friction rub.   No murmur heard. Pulmonary/Chest: Effort normal and breath sounds normal. Not tachypneic. No respiratory distress. She has no decreased breath sounds. She has no wheezes. She has no rhonchi. She has no rales.  Abdominal: Soft. Normal appearance and bowel sounds are normal. There is no tenderness.  Neurological: She is alert.  Skin: Skin is warm, dry and intact. No rash noted.  Psychiatric: Her speech is normal and behavior is normal. Judgment and thought content normal. Her mood appears not anxious. Cognition and memory are normal. She does not exhibit a depressed mood.     Diabetic foot exam: Normal inspection No skin breakdown No calluses  Normal DP pulses Normal sensation to light touch and monofilament Nails normal      Assessment & Plan:

## 2011-06-04 NOTE — Patient Instructions (Addendum)
Start 70/30 Insulin TWICE daily (in pen form, but please call ASAP if you need the syringe form due to cost.) Check blood sugar fasting in morning before meals and at least once a day later 2 hours after meals.

## 2011-06-04 NOTE — Assessment & Plan Note (Addendum)
Recheck in 3 months.

## 2011-06-04 NOTE — Assessment & Plan Note (Signed)
Very poor control. Cannot afford lantus. Change to 70/30 ... Follow up with measurements in 1 month.

## 2011-06-04 NOTE — Assessment & Plan Note (Signed)
Borderline elevation here.. Dare Spillman be white coat HTN.

## 2011-06-08 ENCOUNTER — Telehealth: Payer: Self-pay | Admitting: *Deleted

## 2011-06-08 MED ORDER — INSULIN ASPART PROT & ASPART (70-30 MIX) 100 UNIT/ML ~~LOC~~ SUSP: 30.0000 [IU] | Freq: Two times a day (BID) | SUBCUTANEOUS | Status: DC

## 2011-06-08 NOTE — Telephone Encounter (Signed)
Reasonable to use novolog pen

## 2011-06-08 NOTE — Telephone Encounter (Signed)
Pt states she was given novolog pen 70/30 at her last office visit, and was told that pens would be called in.  They were not, vial of novulin, which pharmacist told her was totally different,  was called in.  I explained to her that that was because of the cost, but pt states she prefers pens.  She is asking that pens be called to [facility] in [place].

## 2011-07-09 ENCOUNTER — Ambulatory Visit: Payer: Federal, State, Local not specified - PPO | Admitting: Family Medicine

## 2011-07-16 ENCOUNTER — Encounter: Payer: Self-pay | Admitting: Family Medicine

## 2011-07-16 ENCOUNTER — Ambulatory Visit (INDEPENDENT_AMBULATORY_CARE_PROVIDER_SITE_OTHER): Payer: Federal, State, Local not specified - PPO | Admitting: Family Medicine

## 2011-07-16 DIAGNOSIS — E119 Type 2 diabetes mellitus without complications: Secondary | ICD-10-CM

## 2011-07-16 DIAGNOSIS — I10 Essential (primary) hypertension: Secondary | ICD-10-CM

## 2011-07-16 MED ORDER — INSULIN ASPART PROT & ASPART (70-30 MIX) 100 UNIT/ML ~~LOC~~ SUSP: SUBCUTANEOUS | Status: DC

## 2011-07-16 NOTE — Assessment & Plan Note (Signed)
Improving FBS and post prandials on 70/30.. Increase Am dose a little to 42 units daily. Follow up in 3 months for A1C. Encouraged exercise, weight loss, healthy eating habits.

## 2011-07-16 NOTE — Assessment & Plan Note (Signed)
Well controlled. Continue current medication.  

## 2011-07-16 NOTE — Progress Notes (Signed)
  Subjective:    Patient ID: Stacy [name], female    DOB: Mar 13, 1956, 55 y.o.   MRN: [id number]  HPI  Diabetes: Poor control.. Last A1C 11.4 on 8/14...now on  Novolog 70/30 PEN 40 UNITS BID for last 3 weeks (could not start right away) Using medications without difficulties: None Hypoglycemic episodes: 54 at night once 2 hours after eating, no skipped meal, some increase in activity that day, a lot of stress Hyperglycemic episodes:off all med at 300, one measurement above 200 once on 70/30 Feet problems:None Blood Sugars averaging: FBS 130-140,  Afternoon after meal 130-190  Hypertension:  Atr goal on current meds.  Using medication without problems or lightheadedness:  Chest pain with exertion:None Edema:None Short of breath:None Average home BPs: Other issues:   Review of Systems  Constitutional: Negative for fever and fatigue.  Respiratory: Negative for shortness of breath.   Cardiovascular: Negative for chest pain.       Objective:   Physical Exam  Constitutional: Vital signs are normal. She appears well-developed and well-nourished. She is cooperative.  Non-toxic appearance. She does not appear ill. No distress.  HENT:  Head: Normocephalic.  Right Ear: Hearing, tympanic membrane, external ear and ear canal normal. Tympanic membrane is not erythematous, not retracted and not bulging.  Left Ear: Hearing, tympanic membrane, external ear and ear canal normal. Tympanic membrane is not erythematous, not retracted and not bulging.  Nose: No mucosal edema or rhinorrhea. Right sinus exhibits no maxillary sinus tenderness and no frontal sinus tenderness. Left sinus exhibits no maxillary sinus tenderness and no frontal sinus tenderness.  Mouth/Throat: Uvula is midline, oropharynx is clear and moist and mucous membranes are normal.  Eyes: Conjunctivae, EOM and lids are normal. Pupils are equal, round, and reactive to light. No foreign bodies found.  Neck: Trachea normal and normal  range of motion. Neck supple. Carotid bruit is not present. No mass and no thyromegaly present.  Cardiovascular: Normal rate, regular rhythm, S1 normal, S2 normal, normal heart sounds, intact distal pulses and normal pulses.  Exam reveals no gallop and no friction rub.   No murmur heard. Pulmonary/Chest: Effort normal and breath sounds normal. Not tachypneic. No respiratory distress. She has no decreased breath sounds. She has no wheezes. She has no rhonchi. She has no rales.  Abdominal: Normal appearance.  Neurological: She is alert.  Skin: Skin is warm, dry and intact.  Psychiatric: Her speech is normal. Her mood appears not anxious. Cognition and memory are normal. She does not exhibit a depressed mood.     Diabetic foot exam: Normal inspection No skin breakdown No calluses  Normal DP pulses Normal sensation to light touch and monofilament Nails normal      Assessment & Plan:

## 2011-07-16 NOTE — Patient Instructions (Addendum)
Increase to Novolog 70/30 42 Units in AM and 40 Units.  Continue working on diet and exercise as well as weight loss.

## 2011-08-31 ENCOUNTER — Other Ambulatory Visit: Payer: Self-pay | Admitting: Family Medicine

## 2011-12-04 ENCOUNTER — Telehealth: Payer: Self-pay | Admitting: Family Medicine

## 2011-12-04 MED ORDER — INSULIN PEN NEEDLE 31G X 5 MM MISC: Status: DC

## 2011-12-04 NOTE — Telephone Encounter (Signed)
rx sent to pharmacy

## 2011-12-04 NOTE — Telephone Encounter (Signed)
Rx refill for her diabetic needles, BD Ultra Fine Pin Needle. Pharmacy, [facility], [place] [phone] Pt call back# [phone]

## 2011-12-04 NOTE — Telephone Encounter (Signed)
Pt called,says she was put on a medication for sleep, and was told if her sleep habits doesn't get any better to call and see if the dose could be increase.  [facility] [place] [phone]

## 2011-12-05 NOTE — Telephone Encounter (Signed)
i looked at all notes from the last couple of years. I will need to have Dr. Senaida Lange input --- I do not see what she is talking about. No sleep prescriptions / insomnia meds on chart in recent years.

## 2011-12-05 NOTE — Telephone Encounter (Signed)
I agree.. I don't see that I dcoumented starting a prescription sleep aid, none have been prescribed in the past by me since EMR. I do believe this pt occ sees here old MD for acute visit, so she may have gotten med there. Please call to find out what med she is talking about and call pharm to see what MD prescribed it in past. If I prescribed it .Marland Kitchen Let me know. If other MD prescribed it have her call him to discuss or make appt to be seen in our office to discuss insomnia issues.

## 2011-12-07 NOTE — Telephone Encounter (Signed)
DISREGARD THIS MESSAGE: PLACED ON THE WRONG PT'S MEDICAL RECORDS CHART.  THIS PATIENT DID NOT CALL FOR ANY SLEEP MEDICINES.Marland KitchenMarland Kitchen

## 2012-04-15 ENCOUNTER — Other Ambulatory Visit (INDEPENDENT_AMBULATORY_CARE_PROVIDER_SITE_OTHER): Payer: Federal, State, Local not specified - PPO

## 2012-04-15 DIAGNOSIS — E119 Type 2 diabetes mellitus without complications: Secondary | ICD-10-CM

## 2012-04-22 ENCOUNTER — Ambulatory Visit (INDEPENDENT_AMBULATORY_CARE_PROVIDER_SITE_OTHER): Payer: Federal, State, Local not specified - PPO | Admitting: Family Medicine

## 2012-04-22 ENCOUNTER — Encounter: Payer: Self-pay | Admitting: Family Medicine

## 2012-04-22 VITALS — BP 162/70 | HR 68 | Temp 98.4°F | Ht 61.5 in | Wt 199.0 lb

## 2012-04-22 DIAGNOSIS — E785 Hyperlipidemia, unspecified: Secondary | ICD-10-CM

## 2012-04-22 DIAGNOSIS — Z23 Encounter for immunization: Secondary | ICD-10-CM

## 2012-04-22 DIAGNOSIS — E119 Type 2 diabetes mellitus without complications: Secondary | ICD-10-CM

## 2012-04-22 DIAGNOSIS — I10 Essential (primary) hypertension: Secondary | ICD-10-CM

## 2012-04-22 DIAGNOSIS — Z1231 Encounter for screening mammogram for malignant neoplasm of breast: Secondary | ICD-10-CM

## 2012-04-22 MED ORDER — LOSARTAN POTASSIUM-HCTZ 50-12.5 MG PO TABS: 1.0000 | ORAL_TABLET | Freq: Every day | ORAL | Status: DC

## 2012-04-22 NOTE — Progress Notes (Signed)
Subjective:    Patient ID: Stacy [name], female    DOB: February 09, 1956, 56 y.o.   MRN: [id number]  HPI  The patient is here for annual wellness exam and preventative care.  We have not seen her in over 9 MONTHS!  Hypertension:  Poor control on no med. Ran out. Using medication without problems or lightheadedness: None Chest pain with exertion:None Edema:None Short of breath:None Average home BPs:occ  Other issues:  Diabetes:  Poor control on novolog 70/30 Taking 42 units in morning and 40 Units at night. Lab Results  Component Value Date   HGBA1C 10.8* 04/15/2012  Using medications without difficulties: Hypoglycemic episodes:None Hyperglycemic episodes:None Feet problems:None Blood Sugars averaging: 160-170 fasting, postprandial 191 eye exam within last year:None  Elevated Cholesterol: Due for re-eval. Previous poor control on no med. Lab Results  Component Value Date   CHOL 189 02/13/2011   HDL 34.70* 02/13/2011   LDLCALC 123* 02/13/2011   LDLDIRECT 141.4 05/21/2010   TRIG 155.0* 02/13/2011   CHOLHDL 5 02/13/2011    Using medications without problems:on no med. Diet compliance: non compliant Exercise:non compliant Other complaints:      Review of Systems  Constitutional: Negative for fever, fatigue and unexpected weight change.  HENT: Negative for ear pain, congestion, sore throat, sneezing, trouble swallowing and sinus pressure.   Eyes: Negative for pain and itching.  Respiratory: Negative for cough, chest tightness, shortness of breath and wheezing.   Cardiovascular: Negative for chest pain, palpitations and leg swelling.  Gastrointestinal: Negative for nausea, abdominal pain, diarrhea, constipation and blood in stool.  Genitourinary: Negative for dysuria, hematuria, vaginal discharge, difficulty urinating and menstrual problem.  Skin: Negative for rash.  Neurological: Negative for syncope, weakness, light-headedness, numbness and headaches.  Psychiatric/Behavioral:  Negative for confusion and dysphoric mood. The patient is not nervous/anxious.        Objective:   Physical Exam  Constitutional: Vital signs are normal. She appears well-developed and well-nourished. She is cooperative.  Non-toxic appearance. She does not appear ill. No distress.  HENT:  Head: Normocephalic.  Right Ear: Hearing, tympanic membrane, external ear and ear canal normal.  Left Ear: Hearing, tympanic membrane, external ear and ear canal normal.  Nose: Nose normal.  Eyes: Conjunctivae, EOM and lids are normal. Pupils are equal, round, and reactive to light. No foreign bodies found.  Neck: Trachea normal and normal range of motion. Neck supple. Carotid bruit is not present. No mass and no thyromegaly present.  Cardiovascular: Normal rate, regular rhythm, S1 normal, S2 normal, normal heart sounds and intact distal pulses.  Exam reveals no gallop.   No murmur heard. Pulmonary/Chest: Effort normal and breath sounds normal. No respiratory distress. She has no wheezes. She has no rhonchi. She has no rales.  Abdominal: Soft. Normal appearance and bowel sounds are normal. She exhibits no distension, no fluid wave, no abdominal bruit and no mass. There is no hepatosplenomegaly. There is no tenderness. There is no rebound, no guarding and no CVA tenderness. No hernia.  Lymphadenopathy:    She has no cervical adenopathy.    She has no axillary adenopathy.  Neurological: She is alert. She has normal strength. No cranial nerve deficit or sensory deficit.  Skin: Skin is warm, dry and intact. No rash noted.  Psychiatric: Her speech is normal and behavior is normal. Judgment normal. Her mood appears not anxious. Cognition and memory are normal. She does not exhibit a depressed mood.     Diabetic foot exam: Normal inspection No skin  breakdown No calluses  Normal DP pulses Normal sensation to light touch and monofilament Nails normal      Assessment & Plan:  The patient's preventative  maintenance and recommended screening tests for an annual wellness exam were reviewed in full today. Brought up to date unless services declined.  Counselled on the importance of diet, exercise, and its role in overall health and mortality. The patient's FH and SH was reviewed, including their home life, tobacco status, and drug and alcohol status.   Vaccines: Up to date with Td.. Due for PNA.  Colon: 08/2005, nml, follow up in 10 years. Nonsmoker: DVE/pap:Plans on seeing GYN.. This year.  Mammogram: Will scheduel.

## 2012-04-22 NOTE — Patient Instructions (Addendum)
Return in 1 week  for fasting lipids, CMET. Make appt with GYN for breast and pelvic exam. Restart losartan/HCTZ. Look into victoza.. Call if interested in starting. If decide against this.. Increase morning 70/30 to 45 Units daily... Make sure to eat regualrly. Stop at front desk to set up mammogram.

## 2012-04-22 NOTE — Assessment & Plan Note (Signed)
Due for re-eval. 

## 2012-04-22 NOTE — Assessment & Plan Note (Signed)
Restart losartan . Follow BP at home. Encouraged exercise, weight loss, healthy eating habits.

## 2012-04-22 NOTE — Assessment & Plan Note (Signed)
Poor control on 70/30. She is very hesitant about increasing dose as she is worried she will drop low. Does not seem to eat regularly. Previous poor control on lantus and could not tolerate many oral meds. She will look into victoza in addition to insulin.

## 2012-05-02 ENCOUNTER — Other Ambulatory Visit (INDEPENDENT_AMBULATORY_CARE_PROVIDER_SITE_OTHER): Payer: Federal, State, Local not specified - PPO

## 2012-05-02 DIAGNOSIS — E785 Hyperlipidemia, unspecified: Secondary | ICD-10-CM

## 2012-05-02 DIAGNOSIS — E119 Type 2 diabetes mellitus without complications: Secondary | ICD-10-CM

## 2012-05-02 LAB — COMPREHENSIVE METABOLIC PANEL
Alkaline Phosphatase: 52 U/L (ref 39–117)
BUN: 10 mg/dL (ref 6–23)
Creatinine, Ser: 0.7 mg/dL (ref 0.4–1.2)
Glucose, Bld: 288 mg/dL — ABNORMAL HIGH (ref 70–99)
Total Bilirubin: 0.9 mg/dL (ref 0.3–1.2)

## 2012-05-02 LAB — LIPID PANEL
Cholesterol: 208 mg/dL — ABNORMAL HIGH (ref 0–200)
Total CHOL/HDL Ratio: 5
Triglycerides: 144 mg/dL (ref 0.0–149.0)
VLDL: 28.8 mg/dL (ref 0.0–40.0)

## 2012-05-05 ENCOUNTER — Ambulatory Visit
Admission: RE | Admit: 2012-05-05 | Discharge: 2012-05-05 | Disposition: A | Discharge status: Home / Self Care | Payer: Federal, State, Local not specified - PPO | Source: Ambulatory Visit | Attending: Family Medicine | Admitting: Family Medicine

## 2012-05-05 DIAGNOSIS — Z1231 Encounter for screening mammogram for malignant neoplasm of breast: Secondary | ICD-10-CM

## 2012-05-05 LAB — HM MAMMOGRAPHY

## 2012-07-19 ENCOUNTER — Encounter (HOSPITAL_COMMUNITY): Payer: Self-pay | Admitting: Psychiatry

## 2012-07-19 ENCOUNTER — Ambulatory Visit (INDEPENDENT_AMBULATORY_CARE_PROVIDER_SITE_OTHER): Payer: Medicare Other | Admitting: Psychiatry

## 2012-07-19 DIAGNOSIS — F329 Major depressive disorder, single episode, unspecified: Secondary | ICD-10-CM | POA: Diagnosis not present

## 2012-07-19 DIAGNOSIS — Z23 Encounter for immunization: Secondary | ICD-10-CM | POA: Diagnosis not present

## 2012-07-19 DIAGNOSIS — I10 Essential (primary) hypertension: Secondary | ICD-10-CM | POA: Diagnosis not present

## 2012-07-19 DIAGNOSIS — E109 Type 1 diabetes mellitus without complications: Secondary | ICD-10-CM | POA: Diagnosis not present

## 2012-07-25 NOTE — Patient Instructions (Signed)
Discussed orally 

## 2012-07-25 NOTE — Progress Notes (Signed)
Patient:   Stacy [name]   DOB:   02-26-1956  MR Number:  [id number]  Location:  [address] [place], [place] [address]  Date of Service:   Tuesday 07/19/2012  Start Time:   3:00 PM End Time:   3:55 PM  Provider/Observer:  Florencia Reasons, MSW, LCSW   Billing Code/Service:  [phone]  Chief Complaint:     Chief Complaint  Patient presents with  . Depression  . Anxiety    Reason for Service:  The patient is referred for services by primary care physician Dr. Juanetta Gosling due to patient experiencing symptoms of depression and anxiety. She is a returning patient to this practice as she was seen in this office from 02/20/2009 through 05/26/2010. Patient reports she has experienced increased stress with the death of her brother in Jan 11, 2013and informed about a month ago that her grievance case with the [employer] has been reopened. The case was opened initially in October 2010 when patient and 2 of her coworkers filed a complaint when they did not receive a satisfactory response from their supervisor when they made a complaint about one of therr coworker's inappropriate dress and conduct. Patient reports her daughter has commented to patient that she is not any fun anymore and that maybe she needs help. Patient reports crying spells, irritability, and being fidgety. She states feeling down and having poor motivation as well as isolating herself from others.  Current Status:  The patient reports depressed mood, sadness, anxiety, sleep difficulty, loss of interest in activities, irritability, excessive worrying, and low energy.  Reliability of Information: Reliable  Behavioral Observation: LYNLEY KILLILEA  presents as a 56 y.o.-year-old  African American Female who appeared younger than her stated age. Her dress was appropriate and she was casual in her appearance.  Her manners were appropriate to the situation.  There were not any physical disabilities noted.  She displayed an appropriate level of  cooperation and motivation.    Interactions:    Active   Attention:   within normal limits  Memory:   within normal limits  Visuo-spatial:   within normal limits  Speech (Volume):  normal  Speech:   normal pitch and normal volume  Thought Process:  Coherent and Relevant  Though Content:  WNL  Orientation:   person, place, time/date, situation, day of week, month of year and year  Judgment:   Good  Planning:   Good  Affect:    Anxious and Depressed, tearful  Mood:    Anxious and Depressed  Insight:   Good  Intelligence:   normal  Marital Status/Living: The patient was born and reared in [place], [place]. She is the oldest of 2 siblings. She reports having a good childhood and loving parents. The patient has been married twice. She left her first marriage after 16 years after losing interest in her husband. She left her second marriage after 5 years due to to she and her husband have been different parties. Patient has a 53 year old daughter from another relationship. She is divorced  Current Employment: Patient reports having to take early retirement from her job at the [employer] last year, 2012.  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  Substance Use:  No concerns of substance abuse are reported.    Education:   The patient has an Scientist, research (physical sciences) in business from [employer].  Medical History:  Past Medical History  Diagnosis Date  . HTN (hypertension)   . Hyperlipidemia   . Diabetes mellitus   . Osteoarthritis   . Chronic back pain     Sexual History:   History  Sexual Activity  . Sexually Active: No    Abuse/Trauma History: Denies  Psychiatric History:  The patient reports no psychiatric hospitalizations. She participated in outpatient psychotherapy in this practice from 02/20/2009 through 05/26/2010. Patient has been prescribed an  antidepressant in the past by her primary care physician but currently is not taking any psychotropic medication.  Family Med/Psych History:  Family History  Problem Relation Age of Onset  . Liver disease Neg Hx   . Colon cancer Neg Hx   . GI problems Neg Hx   . Diabetes Mother   . Diabetes Father     Risk of Suicide/Violence: virtually non-existent. Patient denies past and current suicidal and homicidal ideations. She reports no history of aggression or violence.  Impression/DX:  The patient presents with a history of symptoms of depression and anxiety initially beginning in 2010 when she filed a  complaint with the EEOC regarding her employer. She has continued to experience periods of depressed mood along with anxiety intermittently with symptoms worsening in recent months. She is experiencing and unresolved grief and loss issues related to the death of her brother, her only sibling , and December 2012. She reports additional stress related to being informed about a month ago that her  grievance case with the [employer] has been reopened. Her current symptoms include depressed mood, sadness, anxiety, sleep difficulty, loss of interest in activities, irritability, excessive worrying, and low energy. Diagnoses: Depressive disorder  Disposition/Plan:  The patient attends the assessment appointment today. Confidentiality and limits are discussed. The patient agrees to return for an appointment in one week for continuing assessment and treatment planning. The patient agrees to call this practice, call 911, or have someone take her to the emergency room should symptoms worsen.  Diagnosis:    Axis I:   1. Depressive disorder         Axis II: No diagnosis       Axis III:  See medical history      Axis IV:  problems with primary support group, occupational problems          Axis V:  51-60 moderate symptoms

## 2012-07-26 ENCOUNTER — Ambulatory Visit (INDEPENDENT_AMBULATORY_CARE_PROVIDER_SITE_OTHER): Payer: Medicare Other | Admitting: Psychiatry

## 2012-07-26 DIAGNOSIS — F329 Major depressive disorder, single episode, unspecified: Secondary | ICD-10-CM

## 2012-07-26 NOTE — Progress Notes (Signed)
Patient:  Stacy [name]   DOB: [birth year]  MR Number: [id number]  [facility]:  [address] [place],  [place], [address]  Start: Tuesday 07/26/2012 2:00 PM End: Tuesday 07/26/2012 3:00 PM  Provider/Observer:     Florencia Reasons, MSW, LCSW   Chief Complaint:      Chief Complaint  Patient presents with  . Depression  . Anxiety    Reason For Service:     The patient is referred for services by primary care physician Dr. Juanetta Gosling due to patient experiencing symptoms of depression and anxiety. She is a returning patient to this practice as she was seen in this office from 02/20/2009 through 05/26/2010. Patient reports she has experienced increased stress with the death of her brother in Jan 27, 2013and informed about a month ago that her grievance case with the [employer] has been reopened. The case was opened initially in October 2010 when patient and 2 of her coworkers filed a complaint when they did not receive a satisfactory response from their supervisor when they made a complaint about one of therr coworker's inappropriate dress and conduct. Patient reports her daughter has commented to patient that she is not any fun anymore and that maybe she needs help. Patient reports crying spells, irritability, and being fidgety. She states feeling down and having poor motivation as well as isolating herself from others. Patient is seen for followup appointment today.  Interventions Strategy:  Supportive therapy  Participation Level:   Active  Participation Quality:  Appropriate      Behavioral Observation:  Casual, Alert, and Appropriate.   Current Psychosocial Factors: The patient's brother died in 2011/11/08. Patient's grievance case against her former employer recently has been reopened.  Content of Session:   Reviewing symptoms, processing feelings, identifying losses, discussing patient's relationship with her maternal aunts, identifying ways to improve  self-care  Current Status:   The patient reports depressed mood, sadness, anxiety, loss of interest in activities, irritability, excessive worrying, and low energy. She reports improved sleep pattern and attributes this to the side effects of a blood pressure pill she has just begun taking at night.   Patient Progress:   Fair. The patient reports little to no change in symptoms since last session. She continues to miss her brother and reports feeling very alone as she has a very limited support system. Her daughter is away at college in [place]. She has 2 maternal aunts who live in the local area but reports no contact with them unless patient initiates it. She states feeling as though they do not want to be around her although they say differently when she does have contact with her aunts. She reports support from her ex-husband and an uncle. Patient continues to feel overwhelmed with the responsibility of managing her parents' and her brothers' and estates. She also continues to experience stress regarding her case against her former employer but is pleased that she recently mailed all the requested information for the case. Patient reports erratic eating pattern as well as little to no involvement in activity. Therapist works with patient to identify ways to improve self-care regarding nutrition and physical activity within patient's capability. Patient reports she prepared dinner for her and her ex-husband this past Sunday.   Target Goals:    identifying support system and ways to improve self-care  Last Reviewed:     Goals Addressed Today:     identifying support system and ways to improve self-care  Impression/Diagnosis:   The patient presents with a history of symptoms of depression and anxiety initially beginning in 2010 when she filed a complaint with the EEOC regarding her employer. She has continued to experience periods of depressed mood along with anxiety intermittently with symptoms  worsening in recent months. She is experiencing and unresolved grief and loss issues related to the death of her brother, her only sibling , and December 2012. She reports additional stress related to being informed about a month ago that her grievance case with the [employer] has been reopened. Her current symptoms include depressed mood, sadness, anxiety, sleep difficulty, loss of interest in activities, irritability, excessive worrying, and low energy. Diagnoses: Depressive disorder  Diagnosis:  Axis I:  1. Depressive disorder             Axis II: No diagnosis

## 2012-07-26 NOTE — Patient Instructions (Signed)
Discussed orally 

## 2012-08-03 ENCOUNTER — Ambulatory Visit (INDEPENDENT_AMBULATORY_CARE_PROVIDER_SITE_OTHER): Payer: Medicare Other | Admitting: Psychiatry

## 2012-08-03 DIAGNOSIS — F329 Major depressive disorder, single episode, unspecified: Secondary | ICD-10-CM

## 2012-08-03 DIAGNOSIS — F32A Depression, unspecified: Secondary | ICD-10-CM

## 2012-08-03 NOTE — Patient Instructions (Signed)
Discussed orally 

## 2012-08-03 NOTE — Progress Notes (Signed)
Patient:  Stacy [name]   DOB: October 17, 1955  MR Number: [id number]  Location[facility]:  [address] [place] [place],  [place], [address]  Start: Wednesday 08/03/2012 10:00 AM End: Wednesday 08/03/2012 10:55 AM  Provider/Observer:     Florencia Reasons, MSW, LCSW   Chief Complaint:      Chief Complaint  Patient presents with  . Depression    Reason For Service:     The patient is referred for services by primary care physician Dr. Juanetta Gosling due to patient experiencing symptoms of depression and anxiety. She is a returning patient to this practice as she was seen in this office from 02/20/2009 through 05/26/2010. Patient reports she has experienced increased stress with the death of her brother in Jan 21, 2013and informed about a month ago that her grievance case with the [employer] has been reopened. The case was opened initially in October 2010 when patient and 2 of her coworkers filed a complaint when they did not receive a satisfactory response from their supervisor when they made a complaint about one of therr coworker's inappropriate dress and conduct. Patient reports her daughter has commented to patient that she is not any fun anymore and that maybe she needs help. Patient reports crying spells, irritability, and being fidgety. She states feeling down and having poor motivation as well as isolating herself from others. Patient is seen for followup appointment today.  Interventions Strategy:  Supportive therapy  Participation Level:   Active  Participation Quality:  Appropriate      Behavioral Observation:  Casual, Alert, and Appropriate.   Current Psychosocial Factors: The patient's brother died in November 02, 2011. Patient's grievance case against her former employer recently has been reopened.  Content of Session:   Reviewing symptoms, processing feelings, developing treatment plan, reinforcing patient's efforts to increase involvement in activity, identifying realistic  versus reasonable expectations regarding her relationships with her extended family members   Current Status:   The patient reports sadness, anxiety, and low energy but increased interest in activities.  Patient Progress:   Fair. The patient reports increased interest and involvement in activities including performing various household projects. She also is looking forward to going on an overnight trip this weekend to the mountains. Patient continues to experience sadness along with grief and loss issues. She reports visiting her brother's grave with a friend last week. Patient also has grief and loss issues related to her parents. This has triggered increased loneliness as well as feelings of rejection from her extended family members who do not initiate contact with patient per her report. Therapist works with patient to process her feelings as well as identify realistic expectations versus reasonable expectations regarding the relationship with extended family. Therapist and patient also explore avenues to increase social involvement and develop other relationships. Patient reports continued stress regarding her grievance case with her former employer. She has had no contact from officials since last session and states waiting without any contact is difficult. She expresses anxiety about the possibility of someone watching her as she has heard information that her former employer has watched other people in in the past who have retired or [employer].  Target Goals:    1. Process and resolve grief and loss issues; 1:1 psychotherapy (supportive therapy, grief therapy) one time every 1-4 weeks         2. Resume normal interest in activities; 1:1 psychotherapy (supportive therapy, cognitive behavioral therapy) one time every 1-4 weeks  3. Expand social involvement in  developing different relationships and different activities: 1:1 psychotherapy (supportive therapy, cognitive behavioral therapy  ) one time every 1-4 weeks      4. Improve mood, decrease anxiety, and decrease excessive worry; 1:1 psychotherapy (supportive therapy, cognitive behavior therapy) one time every 1-4 weeks  Last Reviewed:   08/03/2012  Goals Addressed Today:     Goals 1, 2, and 3   Impression/Diagnosis:   The patient presents with a history of symptoms of depression and anxiety initially beginning in 2010 when she filed a complaint with the EEOC regarding her employer. She has continued to experience periods of depressed mood along with anxiety intermittently with symptoms worsening in recent months. She is experiencing and unresolved grief and loss issues related to the death of her brother, her only sibling , and December 2012. She reports additional stress related to being informed about a month ago that her grievance case with the [employer] has been reopened. Her current symptoms include depressed mood, sadness, anxiety, sleep difficulty, loss of interest in activities, irritability, excessive worrying, and low energy. Diagnoses: Depressive disorder  Diagnosis:  Axis I:  1. Depressive disorder             Axis II: No diagnosis

## 2012-08-10 DIAGNOSIS — E785 Hyperlipidemia, unspecified: Secondary | ICD-10-CM | POA: Diagnosis not present

## 2012-08-10 DIAGNOSIS — E669 Obesity, unspecified: Secondary | ICD-10-CM | POA: Diagnosis not present

## 2012-08-10 DIAGNOSIS — I10 Essential (primary) hypertension: Secondary | ICD-10-CM | POA: Diagnosis not present

## 2012-08-17 ENCOUNTER — Ambulatory Visit (INDEPENDENT_AMBULATORY_CARE_PROVIDER_SITE_OTHER): Payer: Medicare Other | Admitting: Psychiatry

## 2012-08-17 DIAGNOSIS — I10 Essential (primary) hypertension: Secondary | ICD-10-CM | POA: Diagnosis not present

## 2012-08-17 DIAGNOSIS — F329 Major depressive disorder, single episode, unspecified: Secondary | ICD-10-CM | POA: Diagnosis not present

## 2012-08-17 DIAGNOSIS — E669 Obesity, unspecified: Secondary | ICD-10-CM | POA: Diagnosis not present

## 2012-08-17 DIAGNOSIS — E785 Hyperlipidemia, unspecified: Secondary | ICD-10-CM | POA: Diagnosis not present

## 2012-08-18 NOTE — Patient Instructions (Signed)
Discussed orally 

## 2012-08-18 NOTE — Progress Notes (Signed)
Patient:  Stacy [name]   DOB: Jun 21, 1956  MR Number: [id number]  Location: [facility]:  [address] [employer] [place],  [place], [address]  Start: Wednesday 08/17/2012 11:00 AM End: Wednesday 08/17/2012 11:55 AM  Provider/Observer:     Florencia Reasons, MSW, LCSW   Chief Complaint:      Chief Complaint  Patient presents with  . Depression    Reason For Service:     The patient is referred for services by primary care physician Dr. Juanetta Gosling due to patient experiencing symptoms of depression and anxiety. She is a returning patient to this practice as she was seen in this office from 02/20/2009 through 05/26/2010. Patient reports she has experienced increased stress with the death of her brother in 01-09-2013and informed about a month ago that her grievance case with the [employer] has been reopened. The case was opened initially in October 2010 when patient and 2 of her coworkers filed a complaint when they did not receive a satisfactory response from their supervisor when they made a complaint about one of therr coworker's inappropriate dress and conduct. Patient reports her daughter has commented to patient that she is not any fun anymore and that maybe she needs help. Patient reports crying spells, irritability, and being fidgety. She states feeling down and having poor motivation as well as isolating herself from others. Patient is seen for followup appointment today.  Interventions Strategy:  Supportive therapy  Participation Level:   Active  Participation Quality:  Appropriate      Behavioral Observation:  Casual, Alert, and Appropriate.   Current Psychosocial Factors: The patient's brother died in 10/21/2011. Patient's grievance case against her former employer recently has been reopened.  Content of Session:   Reviewing symptoms, processing feelings, reinforcing patient's efforts to increase involvement in activity  Current Status:   The patient reports improved  mood, decreased  anxiety, and increased interest in activities.  Patient Progress:   Good. The patient states she has been pretty good set last session. She reports going on a weekend trip to the mountains. She also reports recently seeing an endocrinologist who has changed patient's medication and provided her with a nutrition plan. Patient also is pleased that she recently reconnected with a paternal cousin. They are having frequent contact and recently walked together as well as did errands. Patient is experiencing increased thoughts and anxiety about her case with her former employer as she received information this past weekend regarding a possible meeting. This has triggered increased thoughts and emotions regarding an earlier process regarding the case. Patient also has increased concerns about the possible negative reaction of the employee who committed the act that prompted patient and her coworkers to [employer]. Patient states she has not been barricaded herself to her home but is being very cautious. Therapist works with patient to process her feelings and to explore relaxation techniques  Target Goals:    1. Process and resolve grief and loss issues; 1:1 psychotherapy (supportive therapy, grief therapy) one time every 1-4 weeks         2. Resume normal interest in activities; 1:1 psychotherapy (supportive therapy, cognitive behavioral therapy) one time every 1-4 weeks          3. Expand social involvement in  developing different relationships and different activities: 1:1 psychotherapy (supportive therapy, cognitive behavioral therapy ) one time every 1-4 weeks      4. Improve mood, decrease anxiety, and decrease excessive worry; 1:1  psychotherapy (supportive therapy, cognitive behavior therapy) one time every 1-4 weeks  Last Reviewed:   08/03/2012  Goals Addressed Today:     Goals 1, and 3   Impression/Diagnosis:   The patient presents with a history of symptoms of  depression and anxiety initially beginning in 2010 when she filed a complaint with the EEOC regarding her employer. She has continued to experience periods of depressed mood along with anxiety intermittently with symptoms worsening in recent months. She is experiencing and unresolved grief and loss issues related to the death of her brother, her only sibling , and December 2012. She reports additional stress related to being informed about a month ago that her grievance case with the [employer] has been reopened. Her current symptoms include depressed mood, sadness, anxiety, sleep difficulty, loss of interest in activities, irritability, excessive worrying, and low energy. Diagnoses: Depressive disorder  Diagnosis:  Axis I:  1. Depressive disorder             Axis II: No diagnosis

## 2012-09-02 ENCOUNTER — Ambulatory Visit (HOSPITAL_COMMUNITY): Payer: Self-pay | Admitting: Psychiatry

## 2012-09-20 ENCOUNTER — Other Ambulatory Visit: Payer: Self-pay | Admitting: Family Medicine

## 2012-09-21 ENCOUNTER — Ambulatory Visit (INDEPENDENT_AMBULATORY_CARE_PROVIDER_SITE_OTHER): Payer: Medicare Other | Admitting: Psychiatry

## 2012-09-21 DIAGNOSIS — F3289 Other specified depressive episodes: Secondary | ICD-10-CM

## 2012-09-21 DIAGNOSIS — F329 Major depressive disorder, single episode, unspecified: Secondary | ICD-10-CM | POA: Diagnosis not present

## 2012-09-21 NOTE — Progress Notes (Signed)
Patient:  Stacy [name]   DOB: October 29, 1955  MR Number: [id number]  Location: [facility]:  [address] [employer] [place],  [place], [address]  Start: Wednesday September 29, 2012 11:10 AM End: Wednesday 29-Sep-2012 12:10 PM  Provider/Observer:     Florencia Reasons, MSW, LCSW   Chief Complaint:      Chief Complaint  Patient presents with  . Depression  . Anxiety    Reason For Service:     The patient is referred for services by primary care physician Dr. Juanetta Gosling due to patient experiencing symptoms of depression and anxiety. She is a returning patient to this practice as she was seen in this office from 02/20/2009 through 05/26/2010. Patient reports she has experienced increased stress with the death of her brother in December 19, 2012and informed about a month ago that her grievance case with the [employer] has been reopened. The case was opened initially in October 2010 when patient and 2 of her coworkers filed a complaint when they did not receive a satisfactory response from their supervisor when they made a complaint about one of therr coworker's inappropriate dress and conduct. Patient reports her daughter has commented to patient that she is not any fun anymore and that maybe she needs help. Patient reports crying spells, irritability, and being fidgety. She states feeling down and having poor motivation as well as isolating herself from others. Patient is seen for followup appointment today.  Interventions Strategy:  Supportive therapy  Participation Level:   Active  Participation Quality:  Appropriate      Behavioral Observation:  Casual, Alert, and Appropriate.   Current Psychosocial Factors: The patient's brother died in 09/30/11. Patient's grievance case against her former employer recently has been reopened.  Content of Session:   Reviewing symptoms, processing grief and loss issues  Current Status:   The patient reports increased grief and loss issues. She also reports  decreased involvement in activities due to being sick.  Patient Progress:   Fair. The patient reports she has been sick for the last 2-3 weeks due to to upper respiratory issues and body aches. She started to recover and feel a little better. She reports increased thoughts about her deceased parents and deceased brother due to the holidays as well as the anniversary of her father's death and her brother's death. Therapist works with patient to process grief and loss issues. Patient reports particularly is missing her father during Thanksgiving and Christmas as these were holidays where he really liked to eat. Patient also reports increased thoughts about her brother who died a year ago. She shares narrative of his death with therapist. She also reports recently receiving a call from her brother's girlfriend. Patient admits  negative feelings about the girlfriend due to the way she treated patient's brother. Per patient's report, her brother's girlfriend was not attentive to her brother and disliked his involvement with patient and her mother. However, she is trying to work through some of these issues as she wants to talk with her brother's girlfriend who may be able to provide more information to patient regarding unanswered questions she has about her brother's health prior to his death.  Target Goals:    1. Process and resolve grief and loss issues; 1:1 psychotherapy (supportive therapy, grief therapy) one time every 1-4 weeks         2. Resume normal interest in activities; 1:1 psychotherapy (supportive therapy, cognitive behavioral therapy) one time every 1-4 weeks  3. Expand social involvement in  developing different relationships and different activities: 1:1 psychotherapy (supportive therapy, cognitive behavioral therapy ) one time every 1-4 weeks      4. Improve mood, decrease anxiety, and decrease excessive worry; 1:1 psychotherapy (supportive therapy, cognitive behavior therapy) one time  every 1-4 weeks  Last Reviewed:   08/03/2012  Goals Addressed Today:     Goals 1 and 4   Impression/Diagnosis:   The patient presents with a history of symptoms of depression and anxiety initially beginning in 2010 when she filed a complaint with the EEOC regarding her employer. She has continued to experience periods of depressed mood along with anxiety intermittently with symptoms worsening in recent months. She is experiencing and unresolved grief and loss issues related to the death of her brother, her only sibling , and December 2012. She reports additional stress related to being informed about a month ago that her grievance case with the [employer] has been reopened. Her current symptoms include depressed mood, sadness, anxiety, sleep difficulty, loss of interest in activities, irritability, excessive worrying, and low energy. Diagnoses: Depressive disorder  Diagnosis:  Axis I:  1. Depressive disorder             Axis II: No diagnosis

## 2012-09-21 NOTE — Patient Instructions (Signed)
Discussed orally 

## 2012-09-28 ENCOUNTER — Ambulatory Visit (INDEPENDENT_AMBULATORY_CARE_PROVIDER_SITE_OTHER): Payer: Medicare Other | Admitting: Psychiatry

## 2012-09-28 DIAGNOSIS — F329 Major depressive disorder, single episode, unspecified: Secondary | ICD-10-CM | POA: Diagnosis not present

## 2012-09-28 DIAGNOSIS — F3289 Other specified depressive episodes: Secondary | ICD-10-CM

## 2012-09-29 DIAGNOSIS — IMO0001 Reserved for inherently not codable concepts without codable children: Secondary | ICD-10-CM | POA: Diagnosis not present

## 2012-09-29 DIAGNOSIS — E673 Hypervitaminosis D: Secondary | ICD-10-CM | POA: Diagnosis not present

## 2012-09-29 DIAGNOSIS — I10 Essential (primary) hypertension: Secondary | ICD-10-CM | POA: Diagnosis not present

## 2012-09-29 DIAGNOSIS — E559 Vitamin D deficiency, unspecified: Secondary | ICD-10-CM | POA: Diagnosis not present

## 2012-09-29 DIAGNOSIS — E785 Hyperlipidemia, unspecified: Secondary | ICD-10-CM | POA: Diagnosis not present

## 2012-09-30 NOTE — Progress Notes (Signed)
Patient:  Stacy [name]   DOB: Mar 13, 1956  MR Number: [id number]  Location: [facility]:  [address] [place] [place],  [place], [address]  Start: Wednesday 09/28/2012 11:10 AM End: Wednesday 09/28/2012 12:10 PM  Provider/Observer:     Florencia Reasons, MSW, LCSW   Chief Complaint:      Chief Complaint  Patient presents with  . Depression  . Other    grief and loss    Reason For Service:     The patient is referred for services by primary care physician Dr. Juanetta Gosling due to patient experiencing symptoms of depression and anxiety. She is a returning patient to this practice as she was seen in this office from 02/20/2009 through 05/26/2010. Patient reports she has experienced increased stress with the death of her brother in December 28, 2012and informed about a month ago that her grievance case with the [employer] has been reopened. The case was opened initially in October 2010 when patient and 2 of her coworkers filed a complaint when they did not receive a satisfactory response from their supervisor when they made a complaint about one of therr coworker's inappropriate dress and conduct. Patient reports her daughter has commented to patient that she is not any fun anymore and that maybe she needs help. Patient reports crying spells, irritability, and being fidgety. She states feeling down and having poor motivation as well as isolating herself from others. Patient is seen for followup appointment today.  Interventions Strategy:  Supportive therapy  Participation Level:   Active  Participation Quality:  Appropriate      Behavioral Observation:  Casual, Alert, and Appropriate.   Current Psychosocial Factors: The patient's brother died in 09-Oct-2011. Patient's grievance case against her former employer recently has been reopened.  Content of Session:   Reviewing symptoms, processing grief and loss issues, identifying memorializations and coping techniques  Current Status:   The  patient reports less depressed mood but continued sadness.  Patient Progress:   Good. The patient  states her mood has been okay.  She reports being less depressed but still experiencing sadness at times as she continues to Miss her parents and her brother. She is pleased that her daughter is home from college for the holidays. Patient has been more active and is looking forward to celebrating the holidays with her daughter. She also is happy that she has received calls from several friends. She continues to express sadness and frustration as well as disappointment regarding her maternal aunts as they do not initiate contact with patient does seem to be appreciative of her efforts per patient's report. Therapist works with patient to review realistic expectations versus reasonable expectations. Therapist also works with patient to identify ways to cope with her losses during the holidays and ways to maintain a healthy connection with the memories of her deceased family members  Target Goals:    1. Process and resolve grief and loss issues; 1:1 psychotherapy (supportive therapy, grief therapy) one time every 1-4 weeks         2. Resume normal interest in activities; 1:1 psychotherapy (supportive therapy, cognitive behavioral therapy) one time every 1-4 weeks          3. Expand social involvement in  developing different relationships and different activities: 1:1 psychotherapy (supportive therapy, cognitive behavioral therapy ) one time every 1-4 weeks      4. Improve mood, decrease anxiety, and decrease excessive worry; 1:1 psychotherapy (supportive therapy, cognitive behavior therapy) one time  every 1-4 weeks  Last Reviewed:   08/03/2012  Goals Addressed Today:     Goals 1 and 4   Impression/Diagnosis:   The patient presents with a history of symptoms of depression and anxiety initially beginning in 2010 when she filed a complaint with the EEOC regarding her employer. She has continued to experience  periods of depressed mood along with anxiety intermittently with symptoms worsening in recent months. She is experiencing and unresolved grief and loss issues related to the death of her brother, her only sibling , and December 2012. She reports additional stress related to being informed about a month ago that her grievance case with the [employer] has been reopened. Her current symptoms include depressed mood, sadness, anxiety, sleep difficulty, loss of interest in activities, irritability, excessive worrying, and low energy. Diagnoses: Depressive disorder  Diagnosis:  Axis I:  1. Depressive disorder             Axis II: No diagnosis

## 2012-09-30 NOTE — Patient Instructions (Signed)
Discussed orally 

## 2012-10-13 DIAGNOSIS — E785 Hyperlipidemia, unspecified: Secondary | ICD-10-CM | POA: Diagnosis not present

## 2012-10-13 DIAGNOSIS — E669 Obesity, unspecified: Secondary | ICD-10-CM | POA: Diagnosis not present

## 2012-10-13 DIAGNOSIS — E559 Vitamin D deficiency, unspecified: Secondary | ICD-10-CM | POA: Diagnosis not present

## 2012-10-13 DIAGNOSIS — Z9119 Patient's noncompliance with other medical treatment and regimen: Secondary | ICD-10-CM | POA: Diagnosis not present

## 2012-10-20 ENCOUNTER — Ambulatory Visit (INDEPENDENT_AMBULATORY_CARE_PROVIDER_SITE_OTHER): Payer: Medicare Other | Admitting: Psychiatry

## 2012-10-20 DIAGNOSIS — F329 Major depressive disorder, single episode, unspecified: Secondary | ICD-10-CM | POA: Diagnosis not present

## 2012-10-21 NOTE — Patient Instructions (Signed)
Discussed orally 

## 2012-10-21 NOTE — Progress Notes (Signed)
Patient:  Stacy [name]   DOB: [birth year]  MR Number: [id number]  [facility]: [facility]:  [address] [place],  [place], [address]  Start: Thursday 10/20/2012 1:10 PM End: Thursday 10/20/2012 2:00 PM  Provider/Observer:     Florencia Reasons, MSW, LCSW   Chief Complaint:      Chief Complaint  Patient presents with  . Depression    Reason For Service:     The patient is referred for services by primary care physician Dr. Juanetta Gosling due to patient experiencing symptoms of depression and anxiety. She is a returning patient to this practice as she was seen in this office from 02/20/2009 through 05/26/2010. Patient reports she has experienced increased stress with the death of her brother in January 02, 2013and informed about a month ago that her grievance case with the [employer] has been reopened. The case was opened initially in October 2010 when patient and 2 of her coworkers filed a complaint when they did not receive a satisfactory response from their supervisor when they made a complaint about one of therr coworker's inappropriate dress and conduct. Patient reports her daughter has commented to patient that she is not any fun anymore and that maybe she needs help. Patient reports crying spells, irritability, and being fidgety. She states feeling down and having poor motivation as well as isolating herself from others. Patient is seen for followup appointment today.  Interventions Strategy:  Supportive therapy  Participation Level:   Active  Participation Quality:  Appropriate      Behavioral Observation:  Casual, Alert, and Appropriate.   Current Psychosocial Factors: The patient's brother died in 10-14-11. His birthday was yesterday. Patient's grievance case against her former employer recently has been reopened.  Content of Session:   Reviewing symptoms, processing feelings, identifying coping statements to reduce anxiety, identifying support system, reinforcing  patient's efforts to improve self-care  Current Status:   The patient reports less depressed mood but continued sadness. She also reports increased anxiety.   Patient Progress:   Fair. The patient reports her moods have been up and down. She reports doing well until her daughter left after Christmas break to return to [place]. Patient states facing the realization that she is alone again. She experienced increased thoughts and memories of her deceased brother as his birthday was yesterday. She reports being tearful but being able to talk with her ex-husband about her brother. The patient also reports increased anxiety as the deadlines are approaching for her grievance case against her former employer to be settled. She recently has been informed that the process has begun as to what type of action will be taken against the coworker who harassed patient. She reports increased anxiety about the coworker's possible reaction and possible retaliation against patient. Therapist works with patient to identify coping statements to reduce anxiety and to identify ways to use resources and her support system. Patient is pleased with her increased self-care efforts. She has begun using the Wii regularly and is pleased with her 10 pound weight loss.  Target Goals:    1. Process and resolve grief and loss issues; 1:1 psychotherapy (supportive therapy, grief therapy) one time every 1-4 weeks         2. Resume normal interest in activities; 1:1 psychotherapy (supportive therapy, cognitive behavioral therapy) one time every 1-4 weeks          3. Expand social involvement in  developing different relationships and different activities: 1:1 psychotherapy (supportive therapy,  cognitive behavioral therapy ) one time every 1-4 weeks      4. Improve mood, decrease anxiety, and decrease excessive worry; 1:1 psychotherapy (supportive therapy, cognitive behavior therapy) one time every 1-4 weeks  Last  Reviewed:   08/03/2012  Goals Addressed Today:     Goals 1 and 4   Impression/Diagnosis:   The patient presents with a history of symptoms of depression and anxiety initially beginning in 2010 when she filed a complaint with the EEOC regarding her employer. She has continued to experience periods of depressed mood along with anxiety intermittently with symptoms worsening in recent months. She is experiencing and unresolved grief and loss issues related to the death of her brother, her only sibling , and December 2012. She reports additional stress related to being informed about a month ago that her grievance case with the [employer] has been reopened. Her current symptoms include depressed mood, sadness, anxiety, sleep difficulty, loss of interest in activities, irritability, excessive worrying, and low energy. Diagnoses: Depressive disorder  Diagnosis:  Axis I:  1. Depressive disorder             Axis II: No diagnosis

## 2012-10-25 DIAGNOSIS — E785 Hyperlipidemia, unspecified: Secondary | ICD-10-CM | POA: Diagnosis not present

## 2012-10-25 DIAGNOSIS — M545 Low back pain: Secondary | ICD-10-CM | POA: Diagnosis not present

## 2012-10-25 DIAGNOSIS — I10 Essential (primary) hypertension: Secondary | ICD-10-CM | POA: Diagnosis not present

## 2012-10-25 DIAGNOSIS — E109 Type 1 diabetes mellitus without complications: Secondary | ICD-10-CM | POA: Diagnosis not present

## 2012-11-03 ENCOUNTER — Ambulatory Visit (INDEPENDENT_AMBULATORY_CARE_PROVIDER_SITE_OTHER): Payer: Federal, State, Local not specified - PPO | Admitting: Psychiatry

## 2012-11-03 DIAGNOSIS — F329 Major depressive disorder, single episode, unspecified: Secondary | ICD-10-CM

## 2012-11-03 NOTE — Progress Notes (Signed)
Patient:  Stacy [name]   DOB: [birth year]  MR Number: [id number]  Location: [facility]:  [address] [place],  [place], [address]  Start: Thursday 11/03/2012 11:00 AM End: Thursday 11/03/2012 11:50 AM  Provider/Observer:     Florencia Reasons, MSW, LCSW   Chief Complaint:      Chief Complaint  Patient presents with  . Depression    Reason For Service:     The patient is referred for services by primary care physician Dr. Juanetta Gosling due to patient experiencing symptoms of depression and anxiety. She is a returning patient to this practice as she was seen in this office from 02/20/2009 through 05/26/2010. Patient reports she has experienced increased stress with the death of her brother in 01/10/2013and informed about a month ago that her grievance case with the [employer] has been reopened. The case was opened initially in October 2010 when patient and 2 of her coworkers filed a complaint when they did not receive a satisfactory response from their supervisor when they made a complaint about one of therr coworker's inappropriate dress and conduct. Patient reports her daughter has commented to patient that she is not any fun anymore and that maybe she needs help. Patient reports crying spells, irritability, and being fidgety. She states feeling down and having poor motivation as well as isolating herself from others. Patient is seen for followup appointment today.  Interventions Strategy:  Supportive therapy, grief therapy  Participation Level:   Active  Participation Quality:  Appropriate      Behavioral Observation:  Casual, Alert, and Tearful  Current Psychosocial Factors: The patient's brother died in October 22, 2011. His birthday was yesterday. Patient's grievance case against her former employer recently has been reopened.  Content of Session:   Reviewing symptoms, processing grief and loss issues  Current Status:   The patient reports less depressed mood but  continued sadness. She also reports increased anxiety.   Patient Progress:   Fair. The patient reports continued fluctuations in mood. She is experiencing continued anxiety regarding her grievance case as the deadlines with settlement is today but patient has not received any contact from her former employer. She reports she hasn't been dwelling on this but has tried to stay involved in activities including using her Wii and preparing her brother's home for rental. Patient is experiencing increased grief and loss issues regarding the death of her brother. Therapist works with patient to process her feelings, challenge cognitive distortions, and reframe negative thoughts.  Patient expresses guilt regarding brother's death as she states she feels as though she "let him down". She blames self for taking brother to the hospital and not intervening in arguments between he and his girlfriend.   Target Goals:    1. Process and resolve grief and loss issues; 1:1 psychotherapy (supportive therapy, grief therapy) one time every 1-4 weeks         2. Resume normal interest in activities; 1:1 psychotherapy (supportive therapy, cognitive behavioral therapy) one time every 1-4 weeks          3. Expand social involvement in  developing different relationships and different activities: 1:1 psychotherapy (supportive therapy, cognitive behavioral therapy ) one time every 1-4 weeks      4. Improve mood, decrease anxiety, and decrease excessive worry; 1:1 psychotherapy (supportive therapy, cognitive behavior therapy) one time every 1-4 weeks  Last Reviewed:   08/03/2012  Goals Addressed Today:     Goals 1 and 4  Impression/Diagnosis:   The patient presents with a history of symptoms of depression and anxiety initially beginning in 2010 when she filed a complaint with the EEOC regarding her employer. She has continued to experience periods of depressed mood along with anxiety intermittently with symptoms worsening in  recent months. She is experiencing and unresolved grief and loss issues related to the death of her brother, her only sibling , and December 2012. She reports additional stress related to being informed about a month ago that her grievance case with the [employer] has been reopened. Her current symptoms include depressed mood, sadness, anxiety, sleep difficulty, loss of interest in activities, irritability, excessive worrying, and low energy. Diagnoses: Depressive disorder  Diagnosis:  Axis I:  1. Depressive disorder             Axis II: No diagnosis

## 2012-11-03 NOTE — Patient Instructions (Signed)
Discussed orally 

## 2012-11-17 DIAGNOSIS — I10 Essential (primary) hypertension: Secondary | ICD-10-CM | POA: Diagnosis not present

## 2012-11-17 DIAGNOSIS — E785 Hyperlipidemia, unspecified: Secondary | ICD-10-CM | POA: Diagnosis not present

## 2012-11-17 DIAGNOSIS — Z9119 Patient's noncompliance with other medical treatment and regimen: Secondary | ICD-10-CM | POA: Diagnosis not present

## 2012-11-17 DIAGNOSIS — E559 Vitamin D deficiency, unspecified: Secondary | ICD-10-CM | POA: Diagnosis not present

## 2012-11-18 ENCOUNTER — Ambulatory Visit (INDEPENDENT_AMBULATORY_CARE_PROVIDER_SITE_OTHER): Payer: Medicare Other | Admitting: Psychiatry

## 2012-11-18 DIAGNOSIS — F329 Major depressive disorder, single episode, unspecified: Secondary | ICD-10-CM | POA: Diagnosis not present

## 2012-11-18 NOTE — Progress Notes (Signed)
Patient:  Stacy [name]   DOB: April 21, 1956  MR Number: [id number]  Location: [facility]:  [address] [place],  [place], [address]  Start: Friday 11/18/2012 10:15 AM End: Friday 11/18/2012 11:05 AM  Provider/Observer:     Florencia Reasons, MSW, LCSW   Chief Complaint:      Chief Complaint  Patient presents with  . Depression  . Anxiety    Reason For Service:     The patient is referred for services by primary care physician Dr. Juanetta Gosling due to patient experiencing symptoms of depression and anxiety. She is a returning patient to this practice as she was seen in this office from 02/20/2009 through 05/26/2010. Patient reports she has experienced increased stress with the death of her brother in December 26, 2012and informed about a month ago that her grievance case with the [employer] has been reopened. The case was opened initially in October 2010 when patient and 2 of her coworkers filed a complaint when they did not receive a satisfactory response from their supervisor when they made a complaint about one of therr coworker's inappropriate dress and conduct. Patient reports her daughter has commented to patient that she is not any fun anymore and that maybe she needs help. Patient reports crying spells, irritability, and being fidgety. She states feeling down and having poor motivation as well as isolating herself from others. Patient is seen for followup appointment today.  Interventions Strategy:  Supportive therapy, cognitive behavior therapy  Participation Level:   Active  Participation Quality:  Appropriate      Behavioral Observation:  Casual, Alert,   Current Psychosocial Factors: Patient's grievance case against her former employer recently has been reopened.  Content of Session:   Reviewing symptoms, processing feelings, identifying ways to decrease anxiety  Current Status:   The patient reports depressed mood, increased anxiety, sleep difficulty, loss of interest  in some activities, and poor concentration.  Patient Progress:   Fair. The patient reports increased stress related to receiving at final decision from her former employer regarding a settlement that is much lower than patient has requested. She expresses disappointment and frustration as she had hoped she would finally have closure of the case. She along with her former coworkers and her union representative are in the process of rejecting the decision. This requires extensive paper work per patient's report. Patient reports increased anxiety and worrying since receiving the decision. She also states difficulty sleeping for the past 3 nights  ruminating about the case. She has decreased interest in self-care and has not been using her Wii as she normally does. She also has been unable to enjoy reading as she has in the past due to poor concentration. Therapist works with patient to identify ways to decrease anxiety including developing a schedule limiting the amount of time thinking about and working on the case. Therapist and patient also discuss ways to to set physical boundaries  including confining the paperwork to one room in her home. Therapist also encourages patient to adjust schedule to include  physical activity, pleasurable activities, and relaxation time. Therapist encourages patient to resume diaphragmatic breathing.  Target Goals:    1. Process and resolve grief and loss issues; 1:1 psychotherapy (supportive therapy, grief therapy) one time every 1-4 weeks         2. Resume normal interest in activities; 1:1 psychotherapy (supportive therapy, cognitive behavioral therapy) one time every 1-4 weeks          3.  Expand social involvement in  developing different relationships and different activities: 1:1 psychotherapy (supportive therapy, cognitive behavioral therapy ) one time every 1-4 weeks      4. Improve mood, decrease anxiety, and decrease excessive worry; 1:1 psychotherapy (supportive  therapy, cognitive behavior therapy) one time every 1-4 weeks  Last Reviewed:   08/03/2012  Goals Addressed Today:     Goals 2 and 4   Impression/Diagnosis:   The patient presents with a history of symptoms of depression and anxiety initially beginning in 2010 when she filed a complaint with the EEOC regarding her employer. She has continued to experience periods of depressed mood along with anxiety intermittently with symptoms worsening in recent months. She is experiencing and unresolved grief and loss issues related to the death of her brother, her only sibling , and December 2012. She reports additional stress related to being informed about a month ago that her grievance case with the [employer] has been reopened. Her current symptoms include depressed mood, sadness, anxiety, sleep difficulty, loss of interest in activities, irritability, excessive worrying, and low energy. Diagnoses: Depressive disorder  Diagnosis:  Axis I:  1. Depressive disorder             Axis II: No diagnosis

## 2012-11-18 NOTE — Patient Instructions (Signed)
Discussed orally 

## 2012-12-02 ENCOUNTER — Ambulatory Visit (INDEPENDENT_AMBULATORY_CARE_PROVIDER_SITE_OTHER): Payer: Medicare Other | Admitting: Psychiatry

## 2012-12-02 DIAGNOSIS — F329 Major depressive disorder, single episode, unspecified: Secondary | ICD-10-CM | POA: Diagnosis not present

## 2012-12-02 NOTE — Patient Instructions (Signed)
Discussed orally 

## 2012-12-02 NOTE — Progress Notes (Signed)
Patient:  Stacy [name]   DOB: [birth year]  MR Number: [id number]  Location: [facility]:  [address] [place],  [place], [address]  Start: Friday 12/02/2012 11:00  AM End: Friday 12/02/2012 11:45  AM  Provider/Observer:     Florencia Reasons, MSW, LCSW   Chief Complaint:      Chief Complaint  Patient presents with  . Depression  . Anxiety    Reason For Service:     The patient is referred for services by primary care physician Dr. Juanetta Gosling due to patient experiencing symptoms of depression and anxiety. She is a returning patient to this practice as she was seen in this office from 02/20/2009 through 05/26/2010. Patient reports she has experienced increased stress with the death of her brother in Dec 14, 2012and informed about a month ago that her grievance case with the [employer] has been reopened. The case was opened initially in October 2010 when patient and 2 of her coworkers filed a complaint when they did not receive a satisfactory response from their supervisor when they made a complaint about one of therr coworker's inappropriate dress and conduct. Patient reports her daughter has commented to patient that she is not any fun anymore and that maybe she needs help. Patient reports crying spells, irritability, and being fidgety. She states feeling down and having poor motivation as well as isolating herself from others. Patient is seen for followup appointment today.  Interventions Strategy:  Supportive therapy, cognitive behavior therapy  Participation Level:   Active  Participation Quality:  Appropriate      Behavioral Observation:  Casual, Alert, talkative  Current Psychosocial Factors:   Content of Session:   Reviewing symptoms, processing feelings, reinforcing patient's efforts to improve self-care  Current Status:   The patient reports improved mood, decreased anxiety,  resuming interest in activities, and improved concentration.  Patient Progress:   Good.  The patient reports decreased stress since completing paperwork rejecting her former employer's decision regarding patient's settlement request. She still has no closure regarding the case but reports decreased worry. Patient has resumed interest in activities and self-care efforts. She continues to think about her deceased family members but feels less overwhelmed and is able to focus on positive memories. Patient is looking forward to visiting her daughter in [place] this weekend. .  Target Goals:    1. Process and resolve grief and loss issues; 1:1 psychotherapy (supportive therapy, grief therapy) one time every 1-4 weeks         2. Resume normal interest in activities; 1:1 psychotherapy (supportive therapy, cognitive behavioral therapy) one time every 1-4 weeks          3. Expand social involvement in  developing different relationships and different activities: 1:1 psychotherapy (supportive therapy, cognitive behavioral therapy ) one time every 1-4 weeks      4. Improve mood, decrease anxiety, and decrease excessive worry; 1:1 psychotherapy (supportive therapy, cognitive behavior therapy) one time every 1-4 weeks  Last Reviewed:   08/03/2012  Goals Addressed Today:     Goals 2 and 4   Impression/Diagnosis:   The patient presents with a history of symptoms of depression and anxiety initially beginning in 2010 when she filed a complaint with the EEOC regarding her employer. She has continued to experience periods of depressed mood along with anxiety intermittently with symptoms worsening in recent months. She is experiencing and unresolved grief and loss issues related to the death of her brother, her only sibling ,  and December 2012. She reports additional stress related to being informed about a month ago that her grievance case with the [employer] has been reopened. Her current symptoms include depressed mood, sadness, anxiety, sleep difficulty, loss of interest in activities,  irritability, excessive worrying, and low energy. Diagnoses: Depressive disorder  Diagnosis:  Axis I:  Depressive disorder          Axis II: No diagnosis

## 2012-12-16 ENCOUNTER — Ambulatory Visit (HOSPITAL_COMMUNITY): Payer: Medicare Other | Admitting: Psychiatry

## 2013-01-03 ENCOUNTER — Ambulatory Visit (INDEPENDENT_AMBULATORY_CARE_PROVIDER_SITE_OTHER): Payer: Medicare Other | Admitting: Psychiatry

## 2013-01-03 DIAGNOSIS — F329 Major depressive disorder, single episode, unspecified: Secondary | ICD-10-CM | POA: Diagnosis not present

## 2013-01-03 DIAGNOSIS — F411 Generalized anxiety disorder: Secondary | ICD-10-CM

## 2013-01-03 NOTE — Progress Notes (Addendum)
Patient:  Stacy [name]   DOB: 12-19-1955  MR Number: [id number]  Location: [facility]:  [address] [place],  [place], [address]  Start: Tuesday 01/03/2013 11:00  AM End: Tuesday 01/03/2013 11:50  AM  Provider/Observer:     Florencia Reasons, MSW, LCSW   Chief Complaint:      Chief Complaint  Patient presents with  . Anxiety  . Depression    Reason For Service:     The patient is referred for services by primary care physician Dr. Juanetta Gosling due to patient experiencing symptoms of depression and anxiety. She is a returning patient to this practice as she was seen in this office from 02/20/2009 through 05/26/2010. Patient reports she has experienced increased stress with the death of her brother in 2012/12/30and informed about a month ago that her grievance case with the [employer] has been reopened. The case was opened initially in October 2010 when patient and 2 of her coworkers filed a complaint when they did not receive a satisfactory response from their supervisor when they made a complaint about one of therr coworker's inappropriate dress and conduct. Patient reports her daughter has commented to patient that she is not any fun anymore and that maybe she needs help. Patient reports crying spells, irritability, and being fidgety. She states feeling down and having poor motivation as well as isolating herself from others. Patient is seen for followup appointment today.  Interventions Strategy:  Supportive therapy, cognitive behavior therapy  Participation Level:   Active  Participation Quality:  Appropriate      Behavioral Observation:  Casual, Alert, talkative, tearful, smiles and last and appropriately at times  Current Psychosocial Factors:   Content of Session:   Reviewing symptoms, processing feelings, identifying ways to resume self-care efforts  Current Status:   The patient reports depressed mood, increased anxiety,  decreased interest in activities,  excessive worrying, and poor concentration  Patient Progress:   Fair. The patient reports increased stress since receiving a letter from an  Armed forces logistics/support/administrative officer from [employer] as the letter  indicated that patient's request for compensation was unreasonable. Per patient's report, other comments reflecting patient in a negative manner also were included. Patient expresses anger and hurt. She states feeling belittled and frustrated as she doesn't see any justice.  She expresses hurt and anger that there has been no acknowledgement of responsibility and that her integrity has been questionned. Patient reports increased fear as she does not understand why the case has not been settled. She states fearing type of connection the employee she complained about may have with those in administration. She also expresses fear of possible retaliation. She reports erratic sleep pattern, biting her nails, and difficulty trying to resume a healthy schedule and routine. This also has triggered increased loneliness as well as feelings of vulnerability resulting in increased grief and loss issues regarding her parents and her brother. Therapist works with patient to process her feelings, to identify ways to decrease excessive worrying, and to identify ways to increase involvement in other activities.  Target Goals:    1. Process and resolve grief and loss issues; 1:1 psychotherapy (supportive therapy, grief therapy) one time every 1-4 weeks         2. Resume normal interest in activities; 1:1 psychotherapy (supportive therapy, cognitive behavioral therapy) one time every 1-4 weeks          3. Expand social involvement in  developing different relationships and different activities: 1:1  psychotherapy (supportive therapy, cognitive behavioral therapy ) one time every 1-4 weeks      4. Improve mood, decrease anxiety, and decrease excessive worry; 1:1 psychotherapy (supportive therapy, cognitive behavior therapy) one time every 1-4  weeks  Last Reviewed:   08/03/2012  Goals Addressed Today:     Goals 2 and 4   Impression/Diagnosis:   The patient presents with a history of symptoms of depression and anxiety initially beginning in 2010 when she filed a complaint with the EEOC regarding her employer. She has continued to experience periods of depressed mood along with anxiety intermittently with symptoms worsening in recent months. She is experiencing and unresolved grief and loss issues related to the death of her brother, her only sibling , and December 2012. She reports additional stress related to being informed about a month ago that her grievance case with the [employer] has been reopened. Her current symptoms include depressed mood, sadness, anxiety, sleep difficulty, loss of interest in activities, irritability, excessive worrying, and low energy. Diagnoses: Depressive disorder, anxiety disorder  Diagnosis:  Axis I:  Depressive disorder  Anxiety state, unspecified          Axis II: No diagnosis

## 2013-01-03 NOTE — Patient Instructions (Signed)
Discussed orally 

## 2013-01-09 ENCOUNTER — Other Ambulatory Visit: Payer: Self-pay | Admitting: Family Medicine

## 2013-01-17 ENCOUNTER — Ambulatory Visit (INDEPENDENT_AMBULATORY_CARE_PROVIDER_SITE_OTHER): Payer: Medicare Other | Admitting: Psychiatry

## 2013-01-17 DIAGNOSIS — F329 Major depressive disorder, single episode, unspecified: Secondary | ICD-10-CM

## 2013-01-17 DIAGNOSIS — F411 Generalized anxiety disorder: Secondary | ICD-10-CM | POA: Diagnosis not present

## 2013-01-17 NOTE — Progress Notes (Signed)
Patient:  Stacy [name]   DOB: 04-13-1956  MR Number: [id number]  Location: [facility]:  [address] [place],  [place], [address]  Start: Tuesday 01/17/2013 11:00  AM End: Tuesday 01/17/2013 11:55  AM  Provider/Observer:     Florencia Reasons, MSW, LCSW   Chief Complaint:      Chief Complaint  Patient presents with  . Anxiety  . Depression    Reason For Service:     The patient is referred for services by primary care physician Dr. Juanetta Gosling due to patient experiencing symptoms of depression and anxiety. She is a returning patient to this practice as she was seen in this office from 02/20/2009 through 05/26/2010. Patient reports she has experienced increased stress with the death of her brother in 31-Dec-2012and informed about a month ago that her grievance case with the [employer] has been reopened. The case was opened initially in October 2010 when patient and 2 of her coworkers filed a complaint when they did not receive a satisfactory response from their supervisor when they made a complaint about one of therr coworker's inappropriate dress and conduct. Patient reports her daughter has commented to patient that she is not any fun anymore and that maybe she needs help. Patient reports crying spells, irritability, and being fidgety. She states feeling down and having poor motivation as well as isolating herself from others. Patient is seen for followup appointment today.  Interventions Strategy:  Supportive therapy, cognitive behavior therapy  Participation Level:   Active  Participation Quality:  Appropriate      Behavioral Observation:  Casual, Alert, talkative, tearful  Current Psychosocial Factors: Patient reports ongoing stress regarding her case against her former employer  Content of Session:   Reviewing symptoms, processing feelings, identifying ways to resume self-care efforts  Current Status:   The patient reports continued depressed mood, anxiety, decreased  interest in activities, excessive worrying, and poor concentration  Patient Progress:   Fair. The patient reports continued stress as she has received another letter from a compliance officer regarding her case against the [employer].  Per patient's report, the letter indicates that he compliance officer can't enforce the previous ruling on the case. Patient reports becoming more depressed, angry, and anxious. She continues to experience poor concentration and expresses frustration that she was unable to firmly enjoy her daughters pieces reading as she was overwhelmed with thoughts about her case. She also reports guilt for allowing her preoccupation and worry about the case interfere with the time she had with her brother. Patient also reports feeling as though her life is considered worthless and devaluedby her former employer. Patient continues to express a desire for employer to understand her viewpoint. Therapist works with patient to process her feelings and use perspective taking. Therapist also works with patient to identify patient's values to assist patient in developing direction for life while still coping with emotional pain related to her case.   Target Goals:    1. Process and resolve grief and loss issues; 1:1 psychotherapy (supportive therapy, grief therapy) one time every 1-4 weeks         2. Resume normal interest in activities; 1:1 psychotherapy (supportive therapy, cognitive behavioral therapy) one time every 1-4 weeks          3. Expand social involvement in  developing different relationships and different activities: 1:1 psychotherapy (supportive therapy, cognitive behavioral therapy ) one time every 1-4 weeks      4. Improve mood,  decrease anxiety, and decrease excessive worry; 1:1 psychotherapy (supportive therapy, cognitive behavior therapy) one time every 1-4 weeks  Last Reviewed:   08/03/2012  Goals Addressed Today:     Goals 2 and 4   Impression/Diagnosis:   The patient  presents with a history of symptoms of depression and anxiety initially beginning in 2010 when she filed a complaint with the EEOC regarding her employer. She has continued to experience periods of depressed mood along with anxiety intermittently with symptoms worsening in recent months. She is experiencing and unresolved grief and loss issues related to the death of her brother, her only sibling , and December 2012. She reports additional stress related to being informed about a month ago that her grievance case with the [employer] has been reopened. Her current symptoms include depressed mood, sadness, anxiety, sleep difficulty, loss of interest in activities, irritability, excessive worrying, and low energy. Diagnoses: Depressive disorder, anxiety disorder  Diagnosis:  Axis I:  Depressive disorder  Anxiety state, unspecified          Axis II: No diagnosis

## 2013-01-17 NOTE — Patient Instructions (Signed)
Discussed orally 

## 2013-01-24 ENCOUNTER — Ambulatory Visit (INDEPENDENT_AMBULATORY_CARE_PROVIDER_SITE_OTHER): Payer: Medicare Other | Admitting: Psychiatry

## 2013-01-24 DIAGNOSIS — F411 Generalized anxiety disorder: Secondary | ICD-10-CM

## 2013-01-24 DIAGNOSIS — F329 Major depressive disorder, single episode, unspecified: Secondary | ICD-10-CM

## 2013-01-24 NOTE — Progress Notes (Signed)
Patient:  Stacy [name]   DOB: 04/30/1956  MR Number: [id number]  Location: [facility]:  [address] [place],  [place], [address]  Start: Tuesday 01/24/2013 10:05  AM End: Tuesday 01/24/2013 10:50  AM  Provider/Observer:     Florencia Reasons, MSW, LCSW   Chief Complaint:      Chief Complaint  Patient presents with  . Depression  . Anxiety    Reason For Service:     The patient is referred for services by primary care physician Dr. Juanetta Gosling due to patient experiencing symptoms of depression and anxiety. She is a returning patient to this practice as she was seen in this office from 02/20/2009 through 05/26/2010. Patient reports she has experienced increased stress with the death of her brother in 01-01-13and informed about a month ago that her grievance case with the [employer] has been reopened. The case was opened initially in October 2010 when patient and 2 of her coworkers filed a complaint when they did not receive a satisfactory response from their supervisor when they made a complaint about one of therr coworker's inappropriate dress and conduct. Patient reports her daughter has commented to patient that she is not any fun anymore and that maybe she needs help. Patient reports crying spells, irritability, and being fidgety. She states feeling down and having poor motivation as well as isolating herself from others. Patient is seen for followup appointment today.  Interventions Strategy:  Supportive therapy, cognitive behavior therapy  Participation Level:   Active  Participation Quality:  Appropriate      Behavioral Observation:  Casual, Alert, talkative,   Current Psychosocial Factors: Patient reports her daughter is considering moving to [place] once she graduates in May.  Content of Session:   Reviewing symptoms, processing feelings, identifying ways to resume self-care efforts, identifying ways increase social involvement and expand social  network  Current Status:   The patient reports improved mood, decreased anxiety, and decreased worrying but continued memory difficulty and poor concentration.  Patient Progress:   Fair. The patient reports decreased stress as she has completed and sent letters to [employer] personnel and her congressmen regarding her case. She continues to think about the case but is not as consumed or as angry as she was last week. She has begun to resume interest in other activities and reports recently assisting a relative with a household project. She expresses concern as well as anxiety about the possibility of her daughter moving to [place] once she graduates in May. Patient continues to experience loneliness and has a limited support system. Therapist works with patient to identify ways to increase social involvement and expand social network. Patient expresses a desire to travel and is considering researching a travel program. She also once operated  a daycare. Therapist works with patient to explore ways to pursue her interest in working with young children such as a lunch buddy program or a Education officer, environmental.  Target Goals:    1. Process and resolve grief and loss issues; 1:1 psychotherapy (supportive therapy, grief therapy) one time every 1-4 weeks         2. Resume normal interest in activities; 1:1 psychotherapy (supportive therapy, cognitive behavioral therapy) one time every 1-4 weeks          3. Expand social involvement in  developing different relationships and different activities: 1:1 psychotherapy (supportive therapy, cognitive behavioral therapy ) one time every 1-4 weeks      4. Improve  mood, decrease anxiety, and decrease excessive worry; 1:1 psychotherapy (supportive therapy, cognitive behavior therapy) one time every 1-4 weeks  Last Reviewed:   08/03/2012  Goals Addressed Today:     Goals 2, 3,  and 4   Impression/Diagnosis:   The patient presents with a history of symptoms of depression and  anxiety initially beginning in 2010 when she filed a complaint with the EEOC regarding her employer. She has continued to experience periods of depressed mood along with anxiety intermittently with symptoms worsening in recent months. She is experiencing and unresolved grief and loss issues related to the death of her brother, her only sibling , and December 2012. She reports additional stress related to being informed about a month ago that her grievance case with the [employer] has been reopened. Her current symptoms include depressed mood, sadness, anxiety, sleep difficulty, loss of interest in activities, irritability, excessive worrying, and low energy. Diagnoses: Depressive disorder, anxiety disorder  Diagnosis:  Axis I:  Depressive disorder  Anxiety state, unspecified          Axis II: No diagnosis

## 2013-01-24 NOTE — Patient Instructions (Signed)
Discussed orally 

## 2013-02-07 ENCOUNTER — Ambulatory Visit (INDEPENDENT_AMBULATORY_CARE_PROVIDER_SITE_OTHER): Payer: Medicare Other | Admitting: Psychiatry

## 2013-02-07 DIAGNOSIS — F329 Major depressive disorder, single episode, unspecified: Secondary | ICD-10-CM

## 2013-02-07 DIAGNOSIS — F411 Generalized anxiety disorder: Secondary | ICD-10-CM

## 2013-02-08 DIAGNOSIS — E785 Hyperlipidemia, unspecified: Secondary | ICD-10-CM | POA: Diagnosis not present

## 2013-02-08 DIAGNOSIS — E559 Vitamin D deficiency, unspecified: Secondary | ICD-10-CM | POA: Diagnosis not present

## 2013-02-08 DIAGNOSIS — I10 Essential (primary) hypertension: Secondary | ICD-10-CM | POA: Diagnosis not present

## 2013-02-08 DIAGNOSIS — Z91010 Allergy to peanuts: Secondary | ICD-10-CM | POA: Diagnosis not present

## 2013-02-08 DIAGNOSIS — IMO0001 Reserved for inherently not codable concepts without codable children: Secondary | ICD-10-CM | POA: Diagnosis not present

## 2013-02-08 NOTE — Patient Instructions (Signed)
Discussed orally 

## 2013-02-08 NOTE — Progress Notes (Signed)
Patient:  Stacy [name]   DOB: Jul 20, 1956  MR Number: [id number]  Location: [facility]:  [address] [place],  [place], [address]  Start: Tuesday 02/07/2013 4:05  PM End: Tuesday 02/07/2013 4:55  AM  Provider/Observer:     Florencia Reasons, MSW, LCSW   Chief Complaint:      Chief Complaint  Patient presents with  . Depression  . Anxiety    Reason For Service:     The patient is referred for services by primary care physician Dr. Juanetta Gosling due to patient experiencing symptoms of depression and anxiety. She is a returning patient to this practice as she was seen in this office from 02/20/2009 through 05/26/2010. Patient reports she has experienced increased stress with the death of her brother in 12/29/2012and informed about a month ago that her grievance case with the [employer] has been reopened. The case was opened initially in October 2010 when patient and 2 of her coworkers filed a complaint when they did not receive a satisfactory response from their supervisor when they made a complaint about one of therr coworker's inappropriate dress and conduct. Patient reports her daughter has commented to patient that she is not any fun anymore and that maybe she needs help. Patient reports crying spells, irritability, and being fidgety. She states feeling down and having poor motivation as well as isolating herself from others. Patient is seen for followup appointment today.  Interventions Strategy:  Supportive therapy, cognitive behavior therapy  Participation Level:   Active  Participation Quality:  Appropriate      Behavioral Observation:  Casual, Alert, talkative,   Current Psychosocial Factors: Patient reports receiving another letter from service analysis office of the [employer] regarding her case.  Content of Session:   Reviewing symptoms, processing feelings, reinforcing patient's efforts to compartmentalize and set boundaries, reinforcing patient's efforts increase  social involvement and expand social network  Current Status:   The patient reports improved mood but continued anxiety regarding her casse.  Patient Progress:   Good. The patient reports receiving another letter from an [employer] official regarding her case. She expresses frustration as this process seems to be never ending..Patient received a letter just prior to going away for the weekend. Initially, patient reports becoming upset but being able to compartmentalize her thoughts and concerns about the case and continuing with her plans to go to [facility] for the weekend. Patient reports enjoying the trip and the socialization with her friend. When she returned home, she received a call from her union representative regarding responding to the letter. Patient was able to set boundaries regarding when she will devote time to working on this issue. Patient is pleased she has received a response from one of her congressmen regarding patient's letter. Patient reports she has begun research regarding volunteer opportunities working with young children.  Target Goals:    1. Process and resolve grief and loss issues; 1:1 psychotherapy (supportive therapy, grief therapy) one time every 1-4 weeks         2. Resume normal interest in activities; 1:1 psychotherapy (supportive therapy, cognitive behavioral therapy) one time every 1-4 weeks          3. Expand social involvement in  developing different relationships and different activities: 1:1 psychotherapy (supportive therapy, cognitive behavioral therapy ) one time every 1-4 weeks      4. Improve mood, decrease anxiety, and decrease excessive worry; 1:1 psychotherapy (supportive therapy, cognitive behavior therapy) one time every 1-4 weeks  Last Reviewed:   08/03/2012  Goals Addressed Today:     Goals 2, 3,  and 4   Impression/Diagnosis:   The patient presents with a history of symptoms of depression and anxiety initially beginning in 2010 when she filed a  complaint with the EEOC regarding her employer. She has continued to experience periods of depressed mood along with anxiety intermittently with symptoms worsening in recent months. She is experiencing and unresolved grief and loss issues related to the death of her brother, her only sibling , and December 2012. She reports additional stress related to being informed about a month ago that her grievance case with the [employer] has been reopened. Her current symptoms include depressed mood, sadness, anxiety, sleep difficulty, loss of interest in activities, irritability, excessive worrying, and low energy. Diagnoses: Depressive disorder, anxiety disorder  Diagnosis:  Axis I:  Depressive disorder  Anxiety state, unspecified          Axis II: No diagnosis

## 2013-02-15 DIAGNOSIS — E785 Hyperlipidemia, unspecified: Secondary | ICD-10-CM | POA: Diagnosis not present

## 2013-02-15 DIAGNOSIS — I10 Essential (primary) hypertension: Secondary | ICD-10-CM | POA: Diagnosis not present

## 2013-02-15 DIAGNOSIS — E559 Vitamin D deficiency, unspecified: Secondary | ICD-10-CM | POA: Diagnosis not present

## 2013-02-22 DIAGNOSIS — I10 Essential (primary) hypertension: Secondary | ICD-10-CM | POA: Diagnosis not present

## 2013-02-22 DIAGNOSIS — E109 Type 1 diabetes mellitus without complications: Secondary | ICD-10-CM | POA: Diagnosis not present

## 2013-02-22 DIAGNOSIS — E785 Hyperlipidemia, unspecified: Secondary | ICD-10-CM | POA: Diagnosis not present

## 2013-02-23 ENCOUNTER — Ambulatory Visit (HOSPITAL_COMMUNITY): Payer: Self-pay | Admitting: Psychiatry

## 2013-03-07 ENCOUNTER — Ambulatory Visit (INDEPENDENT_AMBULATORY_CARE_PROVIDER_SITE_OTHER): Payer: Medicare Other | Admitting: Psychiatry

## 2013-03-07 DIAGNOSIS — F329 Major depressive disorder, single episode, unspecified: Secondary | ICD-10-CM

## 2013-03-07 DIAGNOSIS — F411 Generalized anxiety disorder: Secondary | ICD-10-CM | POA: Diagnosis not present

## 2013-03-07 NOTE — Patient Instructions (Signed)
Discussed orally 

## 2013-03-07 NOTE — Progress Notes (Signed)
Patient:  Stacy [name]   DOB: 08-15-1956  MR Number: [id number]  Location: [facility]:  [address] [place],  [place], [address]  Start: Tuesday 03/07/2013 3:50 PM End: Tuesday 03/07/2013 4:40 PM  Provider/Observer:     Florencia Reasons, MSW, LCSW   Chief Complaint:      Chief Complaint  Patient presents with  . Anxiety  . Depression    Reason For Service:     The patient is referred for services by primary care physician Dr. Juanetta Gosling due to patient experiencing symptoms of depression and anxiety. She is a returning patient to this practice as she was seen in this office from 02/20/2009 through 05/26/2010. Patient reports she has experienced increased stress with the death of her brother in 12/18/2012and informed about a month ago that her grievance case with the [employer] has been reopened. The case was opened initially in October 2010 when patient and 2 of her coworkers filed a complaint when they did not receive a satisfactory response from their supervisor when they made a complaint about one of therr coworker's inappropriate dress and conduct. Patient reports her daughter has commented to patient that she is not any fun anymore and that maybe she needs help. Patient reports crying spells, irritability, and being fidgety. She states feeling down and having poor motivation as well as isolating herself from others. Patient is seen for followup appointment today.  Interventions Strategy:  Supportive therapy, cognitive behavior therapy  Participation Level:   Active  Participation Quality:  Appropriate      Behavioral Observation:  Casual, Alert, talkative,   Current Psychosocial Factors: Patient reports daughter has graduated from graduate school and is returning to reside with patient temporarily  Content of Session:   Reviewing symptoms, processing feelings, identifying coping techniques  Current Status:   The patient reports having good days and bad days  sometimes being more depressed when triggered by thoughts of her deceased brother and parents.  Patient Progress:   Fair. The patient is pleased that her daughter has graduated but expresses sadness that her daughter's father and paternal relatives have not being more supportive of daughter. Patient is experiencing  anxiety regarding her daughter returning home temporarily as she states that her daughter does not want to return to [place] and fears daughter will be unhappy. Therapist works with patient to process her feelings and to identify the limits of her responsibility. Therapist also works with patient to identify coping techniques.  Target Goals:    1. Process and resolve grief and loss issues; 1:1 psychotherapy (supportive therapy, grief therapy) one time every 1-4 weeks         2. Resume normal interest in activities; 1:1 psychotherapy (supportive therapy, cognitive behavioral therapy) one time every 1-4 weeks          3. Expand social involvement in  developing different relationships and different activities: 1:1 psychotherapy (supportive therapy, cognitive behavioral therapy ) one time every 1-4 weeks      4. Improve mood, decrease anxiety, and decrease excessive worry; 1:1 psychotherapy (supportive therapy, cognitive behavior therapy) one time every 1-4 weeks  Last Reviewed:   08/03/2012  Goals Addressed Today:     Goal 4   Impression/Diagnosis:   The patient presents with a history of symptoms of depression and anxiety initially beginning in 2010 when she filed a complaint with the EEOC regarding her employer. She has continued to experience periods of depressed mood along with anxiety intermittently  with symptoms worsening in recent months. She is experiencing and unresolved grief and loss issues related to the death of her brother, her only sibling , and December 2012. She reports additional stress related to being informed about a month ago that her grievance case with the [employer] has been reopened. Her current symptoms include depressed mood, sadness, anxiety, sleep difficulty, loss of interest in activities, irritability, excessive worrying, and low energy. Diagnoses: Depressive disorder, anxiety disorder  Diagnosis:  Axis I:  Depressive disorder  Anxiety state, unspecified          Axis II: No diagnosis

## 2013-03-21 ENCOUNTER — Ambulatory Visit (INDEPENDENT_AMBULATORY_CARE_PROVIDER_SITE_OTHER): Payer: Medicare Other | Admitting: Psychiatry

## 2013-03-21 DIAGNOSIS — F329 Major depressive disorder, single episode, unspecified: Secondary | ICD-10-CM | POA: Diagnosis not present

## 2013-03-21 DIAGNOSIS — F419 Anxiety disorder, unspecified: Secondary | ICD-10-CM

## 2013-03-21 DIAGNOSIS — F411 Generalized anxiety disorder: Secondary | ICD-10-CM

## 2013-03-21 NOTE — Patient Instructions (Signed)
Discussed orally 

## 2013-03-21 NOTE — Progress Notes (Signed)
Patient:  Stacy [name]   DOB: 1956-05-07  MR Number: [id number]  Location: [facility]:  [address] [place],  [place], [address]  Start: Tuesday 03/21/2013 11:05 AM End: Tuesday 03/21/2013 11:55 AM  Provider/Observer:     Florencia Reasons, MSW, LCSW   Chief Complaint:      Chief Complaint  Patient presents with  . Depression  . Anxiety    Reason For Service:     The patient is referred for services by primary care physician Dr. Juanetta Gosling due to patient experiencing symptoms of depression and anxiety. She is a returning patient to this practice as she was seen in this office from 02/20/2009 through 05/26/2010. Patient reports she has experienced increased stress with the death of her brother in 12/19/2012and informed about a month ago that her grievance case with the [employer] has been reopened. The case was opened initially in October 2010 when patient and 2 of her coworkers filed a complaint when they did not receive a satisfactory response from their supervisor when they made a complaint about one of therr coworker's inappropriate dress and conduct. Patient reports her daughter has commented to patient that she is not any fun anymore and that maybe she needs help. Patient reports crying spells, irritability, and being fidgety. She states feeling down and having poor motivation as well as isolating herself from others. Patient is seen for followup appointment today.  Interventions Strategy:  Supportive therapy, cognitive behavior therapy  Participation Level:   Active  Participation Quality:  Appropriate      Behavioral Observation:  Casual, Alert, talkative,   Current Psychosocial Factors: Patient reports continued issues regarding her appeal against her former employer  Content of Session:   Reviewing symptoms, processing grief and loss issues, person patient's efforts to increase social involvement  Current Status:   The patient reports improved mood,  decreased anxiety, and increased involvement in activity  Patient Progress:   Good. The patient reports improved mood and increased involvement in activity since last session. She still experiences anxiety regarding her case against her former employer. She and her coworkers have an appointment scheduled for consult with an attorney tomorrow. Patient hopes to learn her options. Patient's daughter has returned home temporarily and their relationship is going well. Patient also reports visiting her aunt, enjoying the visit, and having unrealistic expectations of the relationship. She reports having one bad day earlier this month due to to increased thoughts about her deceased brother. She also is experiencing increased thoughts about her father with an upcoming Father's Day holiday. Therapist works with patient to process grief and loss issues.     Target Goals:    1. Process and resolve grief and loss issues; 1:1 psychotherapy (supportive therapy, grief therapy) one time every 1-4 weeks         2. Resume normal interest in activities; 1:1 psychotherapy (supportive therapy, cognitive behavioral therapy) one time every 1-4 weeks          3. Expand social involvement in  developing different relationships and different activities: 1:1 psychotherapy (supportive therapy, cognitive behavioral therapy ) one time every 1-4 weeks      4. Improve mood, decrease anxiety, and decrease excessive worry; 1:1 psychotherapy (supportive therapy, cognitive behavior therapy) one time every 1-4 weeks  Last Reviewed:   08/03/2012  Goals Addressed Today:     Goals 1 and 3    Impression/Diagnosis:   The patient presents with a history of symptoms of  depression and anxiety initially beginning in 2010 when she filed a complaint with the EEOC regarding her employer. She has continued to experience periods of depressed mood along with anxiety intermittently with symptoms worsening in recent months. She is experiencing and  unresolved grief and loss issues related to the death of her brother, her only sibling , and December 2012. She reports additional stress related to being informed about a month ago that her grievance case with the [employer] has been reopened. Her current symptoms include depressed mood, sadness, anxiety, sleep difficulty, loss of interest in activities, irritability, excessive worrying, and low energy. Diagnoses: Depressive disorder, anxiety disorder  Diagnosis:  Axis I:  Depressive disorder  Anxiety          Axis II: No diagnosis

## 2013-04-11 ENCOUNTER — Ambulatory Visit (INDEPENDENT_AMBULATORY_CARE_PROVIDER_SITE_OTHER): Payer: Medicare Other | Admitting: Psychiatry

## 2013-04-11 DIAGNOSIS — F419 Anxiety disorder, unspecified: Secondary | ICD-10-CM

## 2013-04-11 DIAGNOSIS — F411 Generalized anxiety disorder: Secondary | ICD-10-CM | POA: Diagnosis not present

## 2013-04-11 DIAGNOSIS — F3289 Other specified depressive episodes: Secondary | ICD-10-CM

## 2013-04-11 DIAGNOSIS — F329 Major depressive disorder, single episode, unspecified: Secondary | ICD-10-CM

## 2013-04-11 NOTE — Progress Notes (Signed)
Patient:  Stacy [name]   DOB: 31-Oct-1955  MR Number: [id number]  [facility]: [facility]:  [address] [place],  [place], [address]  Start: Tuesday 04/11/2013 11:05 AM End: Tuesday 04/11/2013 12:05 pM  Provider/Observer:     Florencia Reasons, MSW, LCSW   Chief Complaint:      Chief Complaint  Patient presents with  . Anxiety    Reason For Service:     The patient is referred for services by primary care physician Dr. Juanetta Gosling due to patient experiencing symptoms of depression and anxiety. She is a returning patient to this practice as she was seen in this office from 02/20/2009 through 05/26/2010. Patient reports she has experienced increased stress with the death of her brother in 2013-01-05and informed about a month ago that her grievance case with the [employer] has been reopened. The case was opened initially in October 2010 when patient and 2 of her coworkers filed a complaint when they did not receive a satisfactory response from their supervisor when they made a complaint about one of therr coworker's inappropriate dress and conduct. Patient reports her daughter has commented to patient that she is not any fun anymore and that maybe she needs help. Patient reports crying spells, irritability, and being fidgety. She states feeling down and having poor motivation as well as isolating herself from others. Patient is seen for followup appointment today.  Interventions Strategy:  Supportive therapy, cognitive behavior therapy  Participation Level:   Active  Participation Quality:  Appropriate      Behavioral Observation:  Casual, Alert, talkative, tearful at times  Current Psychosocial Factors: Patient reports relief regarding her issues with her former employer as she has retained an attorney. Patient reports increased anxiety and worry regarding daughter who is moving to [place].  Content of Session:   Reviewing symptoms, processing feelings, discussing boundary issues  in the relationship with her daughter, identifying thought patterns and effects on mood and behavior,   Current Status:   The patient reports improved mood and  increased involvement in activity but increased anxiety and worry regarding daughter.  Patient Progress:   Good. The patient reports improved mood and increased involvement in activity since last session. She reports decreased stress regarding her legal case as she is working with an attorney to manage her case. However, patient reports increased anxiety and stress due to to concerns regarding her daughter. Per patient's report, her daughter is moving to [place] but is experiencing significant anxiety regarding her job performance and depression her life at this time. Patient reports disorder has a tendency to think negatively and anticipate the worst. Patient admits tendency to try to create a carefree life for her daughter. She reports worry about her daughter and her daughter's future. Patient also expresses inappropriate guilt about daughter's father not being involved in her life and admits to try to compensate for his absence. Therapist works with patient to identify realistic expectations of self in the relationship with her daughter. Therapist also works with patient to review relaxation and coping techniques. Patient is looking forward to going away to [facility] next weekend.  Target Goals:    1. Process and resolve grief and loss issues; 1:1 psychotherapy (supportive therapy, grief therapy) one time every 1-4 weeks         2. Resume normal interest in activities; 1:1 psychotherapy (supportive therapy, cognitive behavioral therapy) one time every 1-4 weeks          3. Expand  social involvement in  developing different relationships and different activities: 1:1 psychotherapy (supportive therapy, cognitive behavioral therapy ) one time every 1-4 weeks      4. Improve mood, decrease anxiety, and decrease excessive worry; 1:1 psychotherapy  (supportive therapy, cognitive behavior therapy) one time every 1-4 weeks  Last Reviewed:   08/03/2012  Goals Addressed Today:     Goals  3  And 4   Impression/Diagnosis:   The patient presents with a history of symptoms of depression and anxiety initially beginning in 2010 when she filed a complaint with the EEOC regarding her employer. She has continued to experience periods of depressed mood along with anxiety intermittently with symptoms worsening in recent months. She is experiencing and unresolved grief and loss issues related to the death of her brother, her only sibling , and December 2012. She reports additional stress related to being informed about a month ago that her grievance case with the [employer] has been reopened. Her current symptoms include depressed mood, sadness, anxiety, sleep difficulty, loss of interest in activities, irritability, excessive worrying, and low energy. Diagnoses: Depressive disorder, anxiety disorder  Diagnosis:  Axis I:  Depressive disorder  Anxiety          Axis II: No diagnosis

## 2013-04-11 NOTE — Patient Instructions (Signed)
Discussed orally 

## 2013-05-10 DIAGNOSIS — E785 Hyperlipidemia, unspecified: Secondary | ICD-10-CM | POA: Diagnosis not present

## 2013-05-15 ENCOUNTER — Ambulatory Visit (INDEPENDENT_AMBULATORY_CARE_PROVIDER_SITE_OTHER): Payer: Medicare Other | Admitting: Psychiatry

## 2013-05-15 DIAGNOSIS — F329 Major depressive disorder, single episode, unspecified: Secondary | ICD-10-CM | POA: Diagnosis not present

## 2013-05-15 DIAGNOSIS — F411 Generalized anxiety disorder: Secondary | ICD-10-CM | POA: Diagnosis not present

## 2013-05-15 DIAGNOSIS — F419 Anxiety disorder, unspecified: Secondary | ICD-10-CM

## 2013-05-15 NOTE — Patient Instructions (Signed)
Discussed orally 

## 2013-05-15 NOTE — Progress Notes (Signed)
Patient:  Stacy [name]   DOB: [birth year]  MR Number: [id number]  Location: [facility]:  [address] [place],  [place], [address]  Start: Monday 05/15/2013 3:00 PM End: Monday 05/15/2013 3:50 PM  Provider/Observer:     Florencia Reasons, MSW, LCSW   Chief Complaint:      Chief Complaint  Patient presents with  . Anxiety  . Depression    Reason For Service:     The patient is referred for services by primary care physician Dr. Juanetta Gosling due to patient experiencing symptoms of depression and anxiety. She is a returning patient to this practice as she was seen in this office from 02/20/2009 through 05/26/2010. Patient reports she has experienced increased stress with the death of her brother in 2012-12-26and informed about a month ago that her grievance case with the [employer] has been reopened. The case was opened initially in October 2010 when patient and 2 of her coworkers filed a complaint when they did not receive a satisfactory response from their supervisor when they made a complaint about one of therr coworker's inappropriate dress and conduct. Patient reports her daughter has commented to patient that she is not any fun anymore and that maybe she needs help. Patient reports crying spells, irritability, and being fidgety. She states feeling down and having poor motivation as well as isolating herself from others. Patient is seen for followup appointment today.  Interventions Strategy:  Supportive therapy, cognitive behavior therapy  Participation Level:   Active  Participation Quality:  Appropriate      Behavioral Observation:  Casual, Alert, talkative,   Current Psychosocial Factors: Patient reports relief that daughter has settled into her apartment.  Content of Session:   Reviewing symptoms, processing feelings, reinforcing patient's efforts to set and maintain boundaries, reinforcing patient's efforts to increase involvement in activity  Current Status:   The  patient reports improved mood and  increased involvement in activity as well as decreased anxiety  Patient Progress:   Good. The patient reports improved mood and increased involvement in activity since last session. She reports decreased stress as daughter has settled into her apartment and is considering going to therapy. Patient expresses some anxiety about her legal case against her former employer as she anticipates some response this month as she received information from her former employer in August of last year. However, she is relieved that she continues to work with an attorney regarding this case. She cites an example of being assertive with her relative regarding patient's rental property. Therapist reinforced his patient's efforts to set and maintain boundaries. Patient also reports enjoying recent trip to [place]. Therapist reinforces patient's efforts to develop interest in activities for self.   Target Goals:    1. Process and resolve grief and loss issues; 1:1 psychotherapy (supportive therapy, grief therapy) one time every 1-4 weeks         2. Resume normal interest in activities; 1:1 psychotherapy (supportive therapy, cognitive behavioral therapy) one time every 1-4 weeks          3. Expand social involvement in  developing different relationships and different activities: 1:1 psychotherapy (supportive therapy, cognitive behavioral therapy ) one time every 1-4 weeks      4. Improve mood, decrease anxiety, and decrease excessive worry; 1:1 psychotherapy (supportive therapy, cognitive behavior therapy) one time every 1-4 weeks  Last Reviewed:   08/03/2012  Goals Addressed Today:     Goals 2, 3 and 4  Impression/Diagnosis:   The patient presents with a history of symptoms of depression and anxiety initially beginning in 2010 when she filed a complaint with the EEOC regarding her employer. She has continued to experience periods of depressed mood along with anxiety intermittently with  symptoms worsening in recent months. She is experiencing and unresolved grief and loss issues related to the death of her brother, her only sibling , and December 2012. She reports additional stress related to being informed about a month ago that her grievance case with the [employer] has been reopened. Her current symptoms include depressed mood, sadness, anxiety, sleep difficulty, loss of interest in activities, irritability, excessive worrying, and low energy. Diagnoses: Depressive disorder, anxiety disorder  Diagnosis:  Axis I:  Depressive disorder  Anxiety          Axis II: No diagnosis

## 2013-05-17 DIAGNOSIS — Z9119 Patient's noncompliance with other medical treatment and regimen: Secondary | ICD-10-CM | POA: Diagnosis not present

## 2013-05-17 DIAGNOSIS — E785 Hyperlipidemia, unspecified: Secondary | ICD-10-CM | POA: Diagnosis not present

## 2013-05-17 DIAGNOSIS — E559 Vitamin D deficiency, unspecified: Secondary | ICD-10-CM | POA: Diagnosis not present

## 2013-05-17 DIAGNOSIS — I10 Essential (primary) hypertension: Secondary | ICD-10-CM | POA: Diagnosis not present

## 2013-06-15 ENCOUNTER — Ambulatory Visit (HOSPITAL_COMMUNITY): Payer: Self-pay | Admitting: Psychiatry

## 2013-06-20 ENCOUNTER — Ambulatory Visit (INDEPENDENT_AMBULATORY_CARE_PROVIDER_SITE_OTHER): Payer: Medicare Other | Admitting: Psychiatry

## 2013-06-20 DIAGNOSIS — F419 Anxiety disorder, unspecified: Secondary | ICD-10-CM

## 2013-06-20 DIAGNOSIS — F329 Major depressive disorder, single episode, unspecified: Secondary | ICD-10-CM

## 2013-06-20 DIAGNOSIS — F411 Generalized anxiety disorder: Secondary | ICD-10-CM

## 2013-06-20 NOTE — Progress Notes (Signed)
Patient:  Stacy [name]   DOB: [birth year]  MR Number: [id number]  Location: [facility]:  [address] [place],  [place], [address]  Start: Tuesday 06/20/2013 10:00 AM End: Tuesday 06/20/2013 10:50 AM  Provider/Observer:     Florencia Reasons, MSW, LCSW   Chief Complaint:      Chief Complaint  Patient presents with  . Anxiety  . Depression    Reason For Service:     The patient is referred for services by primary care physician Dr. Juanetta Gosling due to patient experiencing symptoms of depression and anxiety. She is a returning patient to this practice as she was seen in this office from 02/20/2009 through 05/26/2010. Patient reports she has experienced increased stress with the death of her brother in Jan 05, 2013and informed about a month ago that her grievance case with the [employer] has been reopened. The case was opened initially in October 2010 when patient and 2 of her coworkers filed a complaint when they did not receive a satisfactory response from their supervisor when they made a complaint about one of therr coworker's inappropriate dress and conduct. Patient reports her daughter has commented to patient that she is not any fun anymore and that maybe she needs help. Patient reports crying spells, irritability, and being fidgety. She states feeling down and having poor motivation as well as isolating herself from others. Patient is seen for followup appointment today.  Interventions Strategy:  Supportive therapy, cognitive behavior therapy  Participation Level:   Active  Participation Quality:  Appropriate      Behavioral Observation:  Casual, Alert, talkative,   Current Psychosocial Factors:   Content of Session:   Reviewing symptoms, processing feelings, reinforcing patient's efforts to set and maintain boundaries, reinforcing patient's efforts to increase involvement in activity, discussing spirituality  Current Status:   The patient reports improved mood and   increased involvement in activity as well as decreased anxiety  Patient Progress:    The patient reports continued improved mood and increased involvement in activity since last session. She reports increased thoughts and memories of her deceased parents and brother as she has been more involved in cleaning out the family home as she plans to rent it to a friend. Patient states having her moments where she has become very emotional. However she, she is pleased to be  able to release some items. She expresses relief that daughter continues to seem to do well. However, patient continues to worry about daughter being alone since the family unit is small. Patient and therapist also discussed patient's efforts to increase social interaction and expand support system. Patient is maintaining regular contact with her ex-husband who now is in very good friend per patient's report. Patient also reports increased calm  about being alone as she has begun reading the Bible daily. She states this is something her father had always encouraged her to do. Therapist and patient began to discuss her spirituality and the effects on patient.    Target Goals:    1. Process and resolve grief and loss issues; 1:1 psychotherapy (supportive therapy, grief therapy) one time every 1-4 weeks         2. Resume normal interest in activities; 1:1 psychotherapy (supportive therapy, cognitive behavioral therapy) one time every 1-4 weeks          3. Expand social involvement in  developing different relationships and different activities: 1:1 psychotherapy (supportive therapy, cognitive behavioral therapy ) one time every 1-4 weeks  4. Improve mood, decrease anxiety, and decrease excessive worry; 1:1 psychotherapy (supportive therapy, cognitive behavior therapy) one time every 1-4 weeks  Last Reviewed:   08/03/2012  Goals Addressed Today:     Goals 1,  2, 3 and 4   Impression/Diagnosis:   The patient presents with a history of  symptoms of depression and anxiety initially beginning in 2010 when she filed a complaint with the EEOC regarding her employer. She has continued to experience periods of depressed mood along with anxiety intermittently with symptoms worsening in recent months. She is experiencing and unresolved grief and loss issues related to the death of her brother, her only sibling , and December 2012. She reports additional stress related to being informed about a month ago that her grievance case with the [employer] has been reopened. Her current symptoms include depressed mood, sadness, anxiety, sleep difficulty, loss of interest in activities, irritability, excessive worrying, and low energy. Diagnoses: Depressive disorder, anxiety disorder  Diagnosis:  Axis I:  Depressive disorder  Anxiety          Axis II: No diagnosis

## 2013-06-20 NOTE — Patient Instructions (Addendum)
Discussed orally 

## 2013-07-18 ENCOUNTER — Ambulatory Visit (INDEPENDENT_AMBULATORY_CARE_PROVIDER_SITE_OTHER): Payer: Medicare Other | Admitting: Psychiatry

## 2013-07-18 DIAGNOSIS — F411 Generalized anxiety disorder: Secondary | ICD-10-CM | POA: Diagnosis not present

## 2013-07-18 DIAGNOSIS — F419 Anxiety disorder, unspecified: Secondary | ICD-10-CM

## 2013-07-18 DIAGNOSIS — F329 Major depressive disorder, single episode, unspecified: Secondary | ICD-10-CM | POA: Diagnosis not present

## 2013-07-18 NOTE — Progress Notes (Signed)
Patient:  Stacy [name]   DOB: 1956-06-28  MR Number: [id number]  Location: [facility]:  [address] [place],  [place], [address]  Start: Tuesday 07/18/2013 11:00 AM End: Tuesday 07/18/2013 11:50 AM  Provider/Observer:     Florencia Reasons, MSW, LCSW   Chief Complaint:      Chief Complaint  Patient presents with  . Anxiety  . Depression    Reason For Service:     The patient is referred for services by primary care physician Dr. Juanetta Gosling due to patient experiencing symptoms of depression and anxiety. She is a returning patient to this practice as she was seen in this office from 02/20/2009 through 05/26/2010. Patient reports she has experienced increased stress with the death of her brother in 02-Jan-2013and informed about a month ago that her grievance case with the [employer] has been reopened. The case was opened initially in October 2010 when patient and 2 of her coworkers filed a complaint when they did not receive a satisfactory response from their supervisor when they made a complaint about one of therr coworker's inappropriate dress and conduct. Patient reports her daughter has commented to patient that she is not any fun anymore and that maybe she needs help. Patient reports crying spells, irritability, and being fidgety. She states feeling down and having poor motivation as well as isolating herself from others. Patient is seen for followup appointment today.  Interventions Strategy:  Supportive therapy, cognitive behavior therapy  Participation Level:   Active  Participation Quality:  Appropriate      Behavioral Observation:  Casual, Alert, talkative,   Current Psychosocial Factors:   Content of Session:   Reviewing symptoms, processing feelings, reinforcing patient's efforts to increase involvement in activit  Current Status:   The patient reports continued improved mood and  increased involvement in activity as well as decreased anxiety  Patient  Progress:    The patient reports continued improved mood and increased involvement in activity since last session. She has begun attending a yoga class three times per week and reports this as being very helpful. She has continued to work on issues related to her parents property. She now is contemplating selling their items as well as the possibility of selling all of the property. She expresses ambivalent feelings as she thinks she may be disloyal to parents as they worked hard to acquire their possessions per patient's report. Therapist works with patient to process her feelings.    Target Goals:    1. Process and resolve grief and loss issues; 1:1 psychotherapy (supportive therapy, grief therapy) one time every 1-4 weeks         2. Resume normal interest in activities; 1:1 psychotherapy (supportive therapy, cognitive behavioral therapy) one time every 1-4 weeks          3. Expand social involvement in  developing different relationships and different activities: 1:1 psychotherapy (supportive therapy, cognitive behavioral therapy ) one time every 1-4 weeks      4. Improve mood, decrease anxiety, and decrease excessive worry; 1:1 psychotherapy (supportive therapy, cognitive behavior therapy) one time every 1-4 weeks  Last Reviewed:   08/03/2012  Goals Addressed Today:     Goals 1,3   Impression/Diagnosis:   The patient presents with a history of symptoms of depression and anxiety initially beginning in 2010 when she filed a complaint with the EEOC regarding her employer. She has continued to experience periods of depressed mood along with anxiety intermittently with  symptoms worsening in recent months. She is experiencing and unresolved grief and loss issues related to the death of her brother, her only sibling , and December 2012. She reports additional stress related to being informed about a month ago that her grievance case with the [employer] has been reopened. Her current symptoms  include depressed mood, sadness, anxiety, sleep difficulty, loss of interest in activities, irritability, excessive worrying, and low energy. Diagnoses: Depressive disorder, anxiety disorder  Diagnosis:  Axis I:  Depressive disorder  Anxiety          Axis II: No diagnosis

## 2013-07-18 NOTE — Patient Instructions (Signed)
Discussed orally 

## 2013-08-10 DIAGNOSIS — E785 Hyperlipidemia, unspecified: Secondary | ICD-10-CM | POA: Diagnosis not present

## 2013-08-10 DIAGNOSIS — E559 Vitamin D deficiency, unspecified: Secondary | ICD-10-CM | POA: Diagnosis not present

## 2013-08-10 DIAGNOSIS — I10 Essential (primary) hypertension: Secondary | ICD-10-CM | POA: Diagnosis not present

## 2013-08-15 ENCOUNTER — Ambulatory Visit (INDEPENDENT_AMBULATORY_CARE_PROVIDER_SITE_OTHER): Payer: Medicare Other | Admitting: Psychiatry

## 2013-08-15 DIAGNOSIS — F329 Major depressive disorder, single episode, unspecified: Secondary | ICD-10-CM

## 2013-08-15 NOTE — Patient Instructions (Signed)
Discussed orally 

## 2013-08-15 NOTE — Progress Notes (Signed)
Patient:  Stacy [name]   DOB: 15-Jun-1956  MR Number: [id number]  Location: [facility]:  [address] [place],  [place], [address]  Start: Tuesday 08/15/2013 11:00 AM End: Tuesday 08/15/2013 11:50 AM  Provider/Observer:     Florencia Reasons, MSW, LCSW   Chief Complaint:      Chief Complaint  Patient presents with  . Anxiety  . Depression    Reason For Service:     The patient is referred for services by primary care physician Dr. Juanetta Gosling due to patient experiencing symptoms of depression and anxiety. She is a returning patient to this practice as she was seen in this office from 02/20/2009 through 05/26/2010. Patient reports she has experienced increased stress with the death of her brother in 2013/01/05and informed about a month ago that her grievance case with the [employer] has been reopened. The case was opened initially in October 2010 when patient and 2 of her coworkers filed a complaint when they did not receive a satisfactory response from their supervisor when they made a complaint about one of therr coworker's inappropriate dress and conduct. Patient reports her daughter has commented to patient that she is not any fun anymore and that maybe she needs help. Patient reports crying spells, irritability, and being fidgety. She states feeling down and having poor motivation as well as isolating herself from others. Patient is seen for followup appointment today.  Interventions Strategy:  Supportive therapy, cognitive behavior therapy  Participation Level:   Active  Participation Quality:  Appropriate      Behavioral Observation:  Casual, Alert, talkative,   Current Psychosocial Factors:   Content of Session:   Reviewing symptoms, processing feelings, identifying ways to increase social involvement and expand social network  Current Status:   The patient reports decreased anxiety but feeling a little down, experiencing headaches and decreased  appetite  Patient Progress:    The patient expresses disappointment that one of her friends did not go with patient on a trip as promised and did not inform patient until the last minute. She reports going on the trip anyway, gambling,  and losing a significant amount of money. Patient reports feeling alone and expresses sadness and frustration that she has few friends. Therapist works with patient to identify ways to increase social involvement and expand social network. Therapist and patient also discuss establishing goals patient would like to accomplish regarding her interests such as travel within the next 3 months to a year.  Patient is considering taking a paralegal course to increase activity as patient likes research. She has continued attending a yoga class three times per week and reports this as being very helpful. She is experiencing increased memories of her father as the 11th anniversary of his death was this past weekend and his birthday is this upcoming weekend. Therapist works with patient to process her feelings as well as begin to discuss ways to manage the upcoming holidays. Patient maintains close relationship with her daughter but expresses some sadness that her daughter is considering moving to [place].    Target Goals:    1. Process and resolve grief and loss issues; 1:1 psychotherapy (supportive therapy, grief therapy) one time every 1-4 weeks         2. Resume normal interest in activities; 1:1 psychotherapy (supportive therapy, cognitive behavioral therapy) one time every 1-4 weeks          3. Expand social involvement in  developing different relationships  and different activities: 1:1 psychotherapy (supportive therapy, cognitive behavioral therapy ) one time every 1-4 weeks      4. Improve mood, decrease anxiety, and decrease excessive worry; 1:1 psychotherapy (supportive therapy, cognitive behavior therapy) one time every 1-4 weeks  Last Reviewed:   08/03/2012  Goals  Addressed Today:     Goals 1,3, 4   Impression/Diagnosis:   The patient presents with a history of symptoms of depression and anxiety initially beginning in 2010 when she filed a complaint with the EEOC regarding her employer. She has continued to experience periods of depressed mood along with anxiety intermittently with symptoms worsening in recent months. She is experiencing and unresolved grief and loss issues related to the death of her brother, her only sibling , and December 2012. She reports additional stress related to being informed about a month ago that her grievance case with the [employer] has been reopened. Her current symptoms include depressed mood, sadness, anxiety, sleep difficulty, loss of interest in activities, irritability, excessive worrying, and low energy. Diagnoses: Depressive disorder, anxiety disorder  Diagnosis:  Axis I:  Depressive disorder          Axis II: No diagnosis

## 2013-08-17 DIAGNOSIS — E785 Hyperlipidemia, unspecified: Secondary | ICD-10-CM | POA: Diagnosis not present

## 2013-08-17 DIAGNOSIS — I10 Essential (primary) hypertension: Secondary | ICD-10-CM | POA: Diagnosis not present

## 2013-08-17 DIAGNOSIS — E559 Vitamin D deficiency, unspecified: Secondary | ICD-10-CM | POA: Diagnosis not present

## 2013-08-24 DIAGNOSIS — E785 Hyperlipidemia, unspecified: Secondary | ICD-10-CM | POA: Diagnosis not present

## 2013-08-24 DIAGNOSIS — I10 Essential (primary) hypertension: Secondary | ICD-10-CM | POA: Diagnosis not present

## 2013-08-24 DIAGNOSIS — E109 Type 1 diabetes mellitus without complications: Secondary | ICD-10-CM | POA: Diagnosis not present

## 2013-08-24 DIAGNOSIS — M1990 Unspecified osteoarthritis, unspecified site: Secondary | ICD-10-CM | POA: Diagnosis not present

## 2013-09-12 ENCOUNTER — Ambulatory Visit (INDEPENDENT_AMBULATORY_CARE_PROVIDER_SITE_OTHER): Payer: Medicare Other | Admitting: Psychiatry

## 2013-09-12 DIAGNOSIS — F411 Generalized anxiety disorder: Secondary | ICD-10-CM

## 2013-09-12 DIAGNOSIS — F329 Major depressive disorder, single episode, unspecified: Secondary | ICD-10-CM | POA: Diagnosis not present

## 2013-09-12 DIAGNOSIS — F419 Anxiety disorder, unspecified: Secondary | ICD-10-CM

## 2013-09-12 NOTE — Patient Instructions (Signed)
Discussed orally 

## 2013-09-12 NOTE — Progress Notes (Signed)
Patient:  Stacy [name]   DOB: 04-23-1956  MR Number: [id number]  [facility]:  [address] [place],  [place], [address]  Start: Tuesday 09/12/2013 11:10 AM End: Tuesday 09/12/2013 12:00 PM  Provider/Observer:     Florencia Reasons, MSW, LCSW   Chief Complaint:      Chief Complaint  Patient presents with  . Depression  . Anxiety    Reason For Service:     The patient is referred for services by primary care physician Dr. Juanetta Gosling due to patient experiencing symptoms of depression and anxiety. She is a returning patient to this practice as she was seen in this office from 02/20/2009 through 05/26/2010. Patient reports she has experienced increased stress with the death of her brother in 12-16-12and informed about a month ago that her grievance case with the [employer] has been reopened. The case was opened initially in October 2010 when patient and 2 of her coworkers filed a complaint when they did not receive a satisfactory response from their supervisor when they made a complaint about one of therr coworker's inappropriate dress and conduct. Patient reports her daughter has commented to patient that she is not any fun anymore and that maybe she needs help. Patient reports crying spells, irritability, and being fidgety. She states feeling down and having poor motivation as well as isolating herself from others. Patient is seen for followup appointment today.  Interventions Strategy:  Supportive therapy, cognitive behavior therapy  Participation Level:   Active  Participation Quality:  Appropriate      Behavioral Observation:  Casual, Alert, talkative,   Current Psychosocial Factors:   Content of Session:   Reviewing symptoms, processing feelings, discussing patient's progress and agreed that patient has made significant progress.  Current Status:   The patient reports improved mood and continued involvement in activity.  Patient Progress:    The patient  reports doing well since last session. She celebrated Thanksgiving with her daughter. She reports having increased thoughts about her deceased parents and brother during the holidays . She reports being able to recall happy memories and experiencing sadness that her family isn't here but not being overwhelmed by this. She expresses disappointment regarding extended family members as no one contacted her during the holidays. However, she expresses acceptance. Patient has maintained involvement in activity and continues to attend her yoga classes. She also has maintained a good relationship with her ex-husband. She expresses increased acceptance that her daughter is considering moving to [place] as she wants her daughter to be able to pursue her dreams.  Therapist and patient review patient's goals and agree that patient has made significant progress. Therapist and patient also discuss possible termination at next session.   Target Goals:    1. Process and resolve grief and loss issues; 1:1 psychotherapy (supportive therapy, grief therapy) one time every 1-4 weeks         2. Resume normal interest in activities; 1:1 psychotherapy (supportive therapy, cognitive behavioral therapy) one time every 1-4 weeks          3. Expand social involvement in  developing different relationships and different activities: 1:1 psychotherapy (supportive therapy, cognitive behavioral therapy ) one time every 1-4 weeks      4. Improve mood, decrease anxiety, and decrease excessive worry; 1:1 psychotherapy (supportive therapy, cognitive behavior therapy) one time every 1-4 weeks  Last Reviewed:   08/03/2012  Goals Addressed Today:     Goals 1,2,  3,  4   Impression/Diagnosis:   The patient presents with a history of symptoms of depression and anxiety initially beginning in 2010 when she filed a complaint with the EEOC regarding her employer. She has continued to experience periods of depressed mood along with anxiety  intermittently with symptoms worsening in recent months. She is experiencing and unresolved grief and loss issues related to the death of her brother, her only sibling , and December 2012. She reports additional stress related to being informed about a month ago that her grievance case with the [employer] has been reopened. Her current symptoms include depressed mood, sadness, anxiety, sleep difficulty, loss of interest in activities, irritability, excessive worrying, and low energy. Diagnoses: Depressive disorder, anxiety disorder  Diagnosis:  Axis I:  Depressive disorder  Anxiety          Axis II: No diagnosis

## 2013-11-06 DIAGNOSIS — I10 Essential (primary) hypertension: Secondary | ICD-10-CM | POA: Diagnosis not present

## 2013-11-08 ENCOUNTER — Ambulatory Visit (INDEPENDENT_AMBULATORY_CARE_PROVIDER_SITE_OTHER): Payer: Medicare Other | Admitting: Psychiatry

## 2013-11-08 DIAGNOSIS — F411 Generalized anxiety disorder: Secondary | ICD-10-CM | POA: Diagnosis not present

## 2013-11-08 DIAGNOSIS — F32A Depression, unspecified: Secondary | ICD-10-CM

## 2013-11-08 DIAGNOSIS — F329 Major depressive disorder, single episode, unspecified: Secondary | ICD-10-CM | POA: Diagnosis not present

## 2013-11-08 DIAGNOSIS — F419 Anxiety disorder, unspecified: Secondary | ICD-10-CM

## 2013-11-08 DIAGNOSIS — F3289 Other specified depressive episodes: Secondary | ICD-10-CM

## 2013-11-08 NOTE — Progress Notes (Signed)
Patient:  Stacy [name]   DOB: 04-07-1956  MR Number: [id number]  Location: [facility]:  [address] [place],  [place], [address]  Start: Wednesday 11/08/2013  11:00 AM End: Wednesday 11/08/2013  12:00 PM  Provider/Observer:     Maurice Small, MSW, LCSW   Chief Complaint:      Chief Complaint  Patient presents with  . Anxiety    Reason For Service:     The patient is referred for services by primary care physician Dr. Luan Pulling due to patient experiencing symptoms of depression and anxiety. She is a returning patient to this practice as she was seen in this office from 02/20/2009 through 05/26/2010. Patient reports she has experienced increased stress with the death of her brother in 05-Oct-2011 and informed about a month ago that her grievance case with the [employer] has been reopened. The case was opened initially in October 2010 when patient and 2 of her coworkers filed a complaint when they did not receive a satisfactory response from their supervisor when they made a complaint about one of therr coworker's inappropriate dress and conduct. Patient reports her daughter has commented to patient that she is not any fun anymore and that maybe she needs help. Patient reports crying spells, irritability, and being fidgety. She states feeling down and having poor motivation as well as isolating herself from others. Patient is seen for followup appointment today.  Interventions Strategy:  Supportive therapy, cognitive behavior therapy  Participation Level:   Active  Participation Quality:  Appropriate      Behavioral Observation:  Casual, Alert, talkative,   Current Psychosocial Factors: Patient reports financial issues, patient's daughter has returned home to reside temporarily  Content of Session:   Reviewing symptoms, processing feelings,, identifying ways to set and maintain boundaries in the relationship with her daughter, identifying coping statements  Current  Status:   The patient reports continued involvement in activity but increased anxiety.  Patient Progress:    The patient reports  having sad moments during the Christmas holidays but being able to manage fairly well. She has maintained involvement in activity. Her daughter returned home  on December 13. Patient reports increased stress and anxiety related to property taxes on her brother's home and her parents' home as well as increased living expenses since daughter has returned home. Patient expresses frustration as a potential tennant has not finalized arrangements to move into one of her rental properties. Therapist works with patient to identify ways to set and maintain boundaries. Patient is considering using a Ship broker company to assist  working with tennants.  Patient also reports difficulty regarding transition issues in the relationship with her daughter being an adult and asking her to assume financial responsibility for her expenses. Therapist works with patient to process feelings, identify thought patterns, and effects on patient's mood and behavior in the relationship with her daughter. Patient continues to feel guilt that daughter did not have father involved in her life.  Therapist and patient also identify ways to set and maintain boundaries and improve assertiveness skills.     Target Goals:    1. Process and resolve grief and loss issues; 1:1 psychotherapy (supportive therapy, grief therapy) one time every 1-4 weeks         2. Resume normal interest in activities; 1:1 psychotherapy (supportive therapy, cognitive behavioral therapy) one time every 1-4 weeks          3. Expand social involvement in  developing different  relationships and different activities: 1:1 psychotherapy (supportive therapy, cognitive behavioral therapy ) one time every 1-4 weeks      4. Improve mood, decrease anxiety, and decrease excessive worry; 1:1 psychotherapy (supportive therapy, cognitive behavior  therapy) one time every 1-4 weeks  Last Reviewed:   08/03/2012  Goals Addressed Today:     Goal 4   Impression/Diagnosis:   The patient presents with a history of symptoms of depression and anxiety initially beginning in 2010 when she filed a complaint with the EEOC regarding her employer. She has continued to experience periods of depressed mood along with anxiety intermittently with symptoms worsening in recent months. She is experiencing and unresolved grief and loss issues related to the death of her brother, her only sibling , and December 2012. She reports additional stress related to being informed about a month ago that her grievance case with the [employer] has been reopened. Her current symptoms include depressed mood, sadness, anxiety, sleep difficulty, loss of interest in activities, irritability, excessive worrying, and low energy. Diagnoses: Depressive disorder, anxiety disorder  Diagnosis:  Axis I:  Depressive disorder  Anxiety          Axis II: No diagnosis

## 2013-11-08 NOTE — Patient Instructions (Signed)
Discussed orally 

## 2013-11-15 DIAGNOSIS — E785 Hyperlipidemia, unspecified: Secondary | ICD-10-CM | POA: Diagnosis not present

## 2013-11-15 DIAGNOSIS — I10 Essential (primary) hypertension: Secondary | ICD-10-CM | POA: Diagnosis not present

## 2013-11-15 DIAGNOSIS — IMO0001 Reserved for inherently not codable concepts without codable children: Secondary | ICD-10-CM | POA: Diagnosis not present

## 2013-11-15 DIAGNOSIS — E559 Vitamin D deficiency, unspecified: Secondary | ICD-10-CM | POA: Diagnosis not present

## 2013-11-23 DIAGNOSIS — E559 Vitamin D deficiency, unspecified: Secondary | ICD-10-CM | POA: Diagnosis not present

## 2013-11-23 DIAGNOSIS — IMO0001 Reserved for inherently not codable concepts without codable children: Secondary | ICD-10-CM | POA: Diagnosis not present

## 2013-11-23 DIAGNOSIS — I10 Essential (primary) hypertension: Secondary | ICD-10-CM | POA: Diagnosis not present

## 2013-11-23 DIAGNOSIS — E785 Hyperlipidemia, unspecified: Secondary | ICD-10-CM | POA: Diagnosis not present

## 2013-12-04 ENCOUNTER — Ambulatory Visit (INDEPENDENT_AMBULATORY_CARE_PROVIDER_SITE_OTHER): Payer: Medicare Other | Admitting: Psychiatry

## 2013-12-04 DIAGNOSIS — F329 Major depressive disorder, single episode, unspecified: Secondary | ICD-10-CM

## 2013-12-04 DIAGNOSIS — F411 Generalized anxiety disorder: Secondary | ICD-10-CM | POA: Diagnosis not present

## 2013-12-04 DIAGNOSIS — F3289 Other specified depressive episodes: Secondary | ICD-10-CM | POA: Diagnosis not present

## 2013-12-04 DIAGNOSIS — F419 Anxiety disorder, unspecified: Secondary | ICD-10-CM

## 2013-12-04 DIAGNOSIS — F32A Depression, unspecified: Secondary | ICD-10-CM

## 2013-12-04 NOTE — Progress Notes (Signed)
Patient:  Stacy [name]   DOB: 13-Sep-1956  MR Number: [id number]  Location: [facility]:  [address] [place],  [place], [address]  Start: Monday 12/04/2013 11:10 AM End: Monday 12/04/2013 11:55 AM  Provider/Observer:     Maurice Small, MSW, LCSW   Chief Complaint:      Chief Complaint  Patient presents with  . Anxiety  . Depression    Reason For Service:     The patient is referred for services by primary care physician Dr. Luan Pulling due to patient experiencing symptoms of depression and anxiety. She is a returning patient to this practice as she was seen in this office from 02/20/2009 through 05/26/2010. Patient reports she has experienced increased stress with the death of her brother in 2011-10-14 and informed about a month ago that her grievance case with the [employer] has been reopened. The case was opened initially in October 2010 when patient and 2 of her coworkers filed a complaint when they did not receive a satisfactory response from their supervisor when they made a complaint about one of therr coworker's inappropriate dress and conduct. Patient reports her daughter has commented to patient that she is not any fun anymore and that maybe she needs help. Patient reports crying spells, irritability, and being fidgety. She states feeling down and having poor motivation as well as isolating herself from others. Patient is seen for followup appointment today.  Interventions Strategy:  Supportive therapy, cognitive behavior therapy  Participation Level:   Active  Participation Quality:  Appropriate      Behavioral Observation:  Casual, Alert, talkative,   Current Psychosocial Factors: Patient reports financial issues, patient's daughter has returned home to reside temporarily  Content of Session:   Reviewing symptoms, processing feelings, reinforcing patient's efforts to set and maintain boundaries in her relationship with a potential tennant, identifying  coping statements, termination  Current Status:   The patient reports continued involvement in activity but increased anxiety.  Patient Progress:    The patient reports she has been able to work with daughter regarding financial issues. He reports frustration regarding a potential Doreatha Lew who has not followed through regarding their agreement. Patient expresses disappointment and frustration. However, she has been able to set and maintain boundaries and has decided to no longer pursue this particular business arrangement. She reports continued stress regarding financial issues but is exploring her options. Patient is positive about her progress,. Therapist encourages patient to continue efforts and to maintain involvement in activity. Therapist and patient agree to terminate services at this time. Patient is encouraged to call this practice should she need services in the future    Target Goals:    1. Process and resolve grief and loss issues; 1:1 psychotherapy (supportive therapy, grief therapy) one time every 1-4 weeks         2. Resume normal interest in activities; 1:1 psychotherapy (supportive therapy, cognitive behavioral therapy) one time every 1-4 weeks          3. Expand social involvement in  developing different relationships and different activities: 1:1 psychotherapy (supportive therapy, cognitive behavioral therapy ) one time every 1-4 weeks      4. Improve mood, decrease anxiety, and decrease excessive worry; 1:1 psychotherapy (supportive therapy, cognitive behavior therapy) one time every 1-4 weeks  Last Reviewed:   08/03/2012  Goals Addressed Today:     Goal 4   Impression/Diagnosis:   The patient presents with a history of symptoms of depression and  anxiety initially beginning in 2010 when she filed a complaint with the EEOC regarding her employer. She has continued to experience periods of depressed mood along with anxiety intermittently with symptoms worsening in recent months.  She is experiencing and unresolved grief and loss issues related to the death of her brother, her only sibling , and December 2012. She reports additional stress related to being informed about a month ago that her grievance case with the [employer] has been reopened. Her current symptoms include depressed mood, sadness, anxiety, sleep difficulty, loss of interest in activities, irritability, excessive worrying, and low energy. Diagnoses: Depressive disorder, anxiety disorder  Diagnosis:  Axis I:  Depressive disorder  Anxiety          Axis II: No diagnosis        Outpatient Therapist Discharge Summary  YANI COVENTRY    10-14-1955   Admission Date: 07/19/2012 Discharge Date:  12/04/2013 Reason for Discharge:  Treatment Completer Diagnosis:  Axis I:  Depressive disorder  Anxiety   Axis V:  71-80  Comments:    Peggy Bynum LCSW

## 2013-12-04 NOTE — Patient Instructions (Signed)
Discussed orally 

## 2014-03-09 DIAGNOSIS — E119 Type 2 diabetes mellitus without complications: Secondary | ICD-10-CM | POA: Diagnosis not present

## 2014-04-12 DIAGNOSIS — I10 Essential (primary) hypertension: Secondary | ICD-10-CM | POA: Diagnosis not present

## 2014-04-12 DIAGNOSIS — L255 Unspecified contact dermatitis due to plants, except food: Secondary | ICD-10-CM | POA: Diagnosis not present

## 2014-06-27 DIAGNOSIS — IMO0001 Reserved for inherently not codable concepts without codable children: Secondary | ICD-10-CM | POA: Diagnosis not present

## 2014-06-27 DIAGNOSIS — E785 Hyperlipidemia, unspecified: Secondary | ICD-10-CM | POA: Diagnosis not present

## 2014-06-27 DIAGNOSIS — I10 Essential (primary) hypertension: Secondary | ICD-10-CM | POA: Diagnosis not present

## 2014-07-04 DIAGNOSIS — E785 Hyperlipidemia, unspecified: Secondary | ICD-10-CM | POA: Diagnosis not present

## 2014-07-04 DIAGNOSIS — E559 Vitamin D deficiency, unspecified: Secondary | ICD-10-CM | POA: Diagnosis not present

## 2014-07-04 DIAGNOSIS — I10 Essential (primary) hypertension: Secondary | ICD-10-CM | POA: Diagnosis not present

## 2014-07-04 DIAGNOSIS — IMO0001 Reserved for inherently not codable concepts without codable children: Secondary | ICD-10-CM | POA: Diagnosis not present

## 2014-10-02 DIAGNOSIS — L989 Disorder of the skin and subcutaneous tissue, unspecified: Secondary | ICD-10-CM | POA: Diagnosis not present

## 2014-10-02 DIAGNOSIS — I10 Essential (primary) hypertension: Secondary | ICD-10-CM | POA: Diagnosis not present

## 2014-10-02 DIAGNOSIS — E119 Type 2 diabetes mellitus without complications: Secondary | ICD-10-CM | POA: Diagnosis not present

## 2014-10-18 DIAGNOSIS — D2361 Other benign neoplasm of skin of right upper limb, including shoulder: Secondary | ICD-10-CM | POA: Diagnosis not present

## 2014-10-18 DIAGNOSIS — D225 Melanocytic nevi of trunk: Secondary | ICD-10-CM | POA: Diagnosis not present

## 2014-12-13 DIAGNOSIS — I10 Essential (primary) hypertension: Secondary | ICD-10-CM | POA: Diagnosis not present

## 2014-12-13 DIAGNOSIS — E559 Vitamin D deficiency, unspecified: Secondary | ICD-10-CM | POA: Diagnosis not present

## 2014-12-13 DIAGNOSIS — E119 Type 2 diabetes mellitus without complications: Secondary | ICD-10-CM | POA: Diagnosis not present

## 2014-12-13 DIAGNOSIS — E785 Hyperlipidemia, unspecified: Secondary | ICD-10-CM | POA: Diagnosis not present

## 2014-12-21 DIAGNOSIS — I10 Essential (primary) hypertension: Secondary | ICD-10-CM | POA: Diagnosis not present

## 2014-12-21 DIAGNOSIS — E559 Vitamin D deficiency, unspecified: Secondary | ICD-10-CM | POA: Diagnosis not present

## 2014-12-21 DIAGNOSIS — E785 Hyperlipidemia, unspecified: Secondary | ICD-10-CM | POA: Diagnosis not present

## 2014-12-21 DIAGNOSIS — E1165 Type 2 diabetes mellitus with hyperglycemia: Secondary | ICD-10-CM | POA: Diagnosis not present

## 2014-12-28 DIAGNOSIS — I10 Essential (primary) hypertension: Secondary | ICD-10-CM | POA: Diagnosis not present

## 2014-12-28 DIAGNOSIS — E1165 Type 2 diabetes mellitus with hyperglycemia: Secondary | ICD-10-CM | POA: Diagnosis not present

## 2014-12-28 DIAGNOSIS — E785 Hyperlipidemia, unspecified: Secondary | ICD-10-CM | POA: Diagnosis not present

## 2015-01-14 DIAGNOSIS — I10 Essential (primary) hypertension: Secondary | ICD-10-CM | POA: Diagnosis not present

## 2015-01-14 DIAGNOSIS — E1165 Type 2 diabetes mellitus with hyperglycemia: Secondary | ICD-10-CM | POA: Diagnosis not present

## 2015-01-14 DIAGNOSIS — E785 Hyperlipidemia, unspecified: Secondary | ICD-10-CM | POA: Diagnosis not present

## 2015-01-14 DIAGNOSIS — Z9119 Patient's noncompliance with other medical treatment and regimen: Secondary | ICD-10-CM | POA: Diagnosis not present

## 2015-03-04 DIAGNOSIS — M545 Low back pain: Secondary | ICD-10-CM | POA: Diagnosis not present

## 2015-03-04 DIAGNOSIS — I10 Essential (primary) hypertension: Secondary | ICD-10-CM | POA: Diagnosis not present

## 2015-03-05 DIAGNOSIS — E785 Hyperlipidemia, unspecified: Secondary | ICD-10-CM | POA: Diagnosis not present

## 2015-03-05 DIAGNOSIS — I10 Essential (primary) hypertension: Secondary | ICD-10-CM | POA: Diagnosis not present

## 2015-03-05 DIAGNOSIS — E1122 Type 2 diabetes mellitus with diabetic chronic kidney disease: Secondary | ICD-10-CM | POA: Diagnosis not present

## 2015-03-12 DIAGNOSIS — I10 Essential (primary) hypertension: Secondary | ICD-10-CM | POA: Diagnosis not present

## 2015-03-12 DIAGNOSIS — E785 Hyperlipidemia, unspecified: Secondary | ICD-10-CM | POA: Diagnosis not present

## 2015-03-12 DIAGNOSIS — E1165 Type 2 diabetes mellitus with hyperglycemia: Secondary | ICD-10-CM | POA: Diagnosis not present

## 2015-04-03 ENCOUNTER — Other Ambulatory Visit (HOSPITAL_COMMUNITY): Payer: Self-pay | Admitting: Pulmonary Disease

## 2015-04-03 DIAGNOSIS — E1165 Type 2 diabetes mellitus with hyperglycemia: Secondary | ICD-10-CM | POA: Diagnosis not present

## 2015-04-03 DIAGNOSIS — M79604 Pain in right leg: Secondary | ICD-10-CM

## 2015-04-03 DIAGNOSIS — M79605 Pain in left leg: Secondary | ICD-10-CM

## 2015-04-03 DIAGNOSIS — I10 Essential (primary) hypertension: Secondary | ICD-10-CM | POA: Diagnosis not present

## 2015-04-03 DIAGNOSIS — E669 Obesity, unspecified: Secondary | ICD-10-CM | POA: Diagnosis not present

## 2015-04-03 DIAGNOSIS — M545 Low back pain: Secondary | ICD-10-CM

## 2015-04-09 DIAGNOSIS — I10 Essential (primary) hypertension: Secondary | ICD-10-CM | POA: Diagnosis not present

## 2015-04-09 DIAGNOSIS — E1165 Type 2 diabetes mellitus with hyperglycemia: Secondary | ICD-10-CM | POA: Diagnosis not present

## 2015-04-09 DIAGNOSIS — E785 Hyperlipidemia, unspecified: Secondary | ICD-10-CM | POA: Diagnosis not present

## 2015-04-11 ENCOUNTER — Ambulatory Visit (HOSPITAL_COMMUNITY)
Admission: RE | Admit: 2015-04-11 | Discharge: 2015-04-11 | Disposition: A | Discharge status: Home / Self Care | Payer: Medicare Other | Source: Ambulatory Visit | Attending: Pulmonary Disease | Admitting: Pulmonary Disease

## 2015-04-11 DIAGNOSIS — M5431 Sciatica, right side: Secondary | ICD-10-CM | POA: Diagnosis not present

## 2015-04-11 DIAGNOSIS — M545 Low back pain: Secondary | ICD-10-CM

## 2015-04-11 DIAGNOSIS — M79604 Pain in right leg: Secondary | ICD-10-CM

## 2015-04-11 DIAGNOSIS — M5432 Sciatica, left side: Secondary | ICD-10-CM | POA: Insufficient documentation

## 2015-04-11 DIAGNOSIS — M5126 Other intervertebral disc displacement, lumbar region: Secondary | ICD-10-CM | POA: Diagnosis not present

## 2015-04-11 DIAGNOSIS — M1288 Other specific arthropathies, not elsewhere classified, other specified site: Secondary | ICD-10-CM | POA: Diagnosis not present

## 2015-04-11 DIAGNOSIS — M4806 Spinal stenosis, lumbar region: Secondary | ICD-10-CM | POA: Insufficient documentation

## 2015-04-11 DIAGNOSIS — M79605 Pain in left leg: Secondary | ICD-10-CM

## 2015-05-02 DIAGNOSIS — M545 Low back pain: Secondary | ICD-10-CM | POA: Diagnosis not present

## 2015-05-02 DIAGNOSIS — M5417 Radiculopathy, lumbosacral region: Secondary | ICD-10-CM | POA: Diagnosis not present

## 2015-05-02 DIAGNOSIS — M4806 Spinal stenosis, lumbar region: Secondary | ICD-10-CM | POA: Diagnosis not present

## 2015-05-02 DIAGNOSIS — M5126 Other intervertebral disc displacement, lumbar region: Secondary | ICD-10-CM | POA: Diagnosis not present

## 2015-05-02 DIAGNOSIS — M5137 Other intervertebral disc degeneration, lumbosacral region: Secondary | ICD-10-CM | POA: Diagnosis not present

## 2015-05-15 DIAGNOSIS — M47817 Spondylosis without myelopathy or radiculopathy, lumbosacral region: Secondary | ICD-10-CM | POA: Diagnosis not present

## 2015-05-15 DIAGNOSIS — M545 Low back pain: Secondary | ICD-10-CM | POA: Diagnosis not present

## 2015-05-21 DIAGNOSIS — H2513 Age-related nuclear cataract, bilateral: Secondary | ICD-10-CM | POA: Diagnosis not present

## 2015-05-21 DIAGNOSIS — E119 Type 2 diabetes mellitus without complications: Secondary | ICD-10-CM | POA: Diagnosis not present

## 2015-05-27 DIAGNOSIS — M79652 Pain in left thigh: Secondary | ICD-10-CM | POA: Diagnosis not present

## 2015-05-27 DIAGNOSIS — M47817 Spondylosis without myelopathy or radiculopathy, lumbosacral region: Secondary | ICD-10-CM | POA: Diagnosis not present

## 2015-05-27 DIAGNOSIS — M79651 Pain in right thigh: Secondary | ICD-10-CM | POA: Diagnosis not present

## 2015-05-27 DIAGNOSIS — M545 Low back pain: Secondary | ICD-10-CM | POA: Diagnosis not present

## 2015-06-10 ENCOUNTER — Other Ambulatory Visit (HOSPITAL_COMMUNITY): Payer: Self-pay | Admitting: Pulmonary Disease

## 2015-06-10 DIAGNOSIS — E119 Type 2 diabetes mellitus without complications: Secondary | ICD-10-CM | POA: Diagnosis not present

## 2015-06-10 DIAGNOSIS — G467 Other lacunar syndromes: Secondary | ICD-10-CM

## 2015-06-10 DIAGNOSIS — I10 Essential (primary) hypertension: Secondary | ICD-10-CM | POA: Diagnosis not present

## 2015-06-12 DIAGNOSIS — M5126 Other intervertebral disc displacement, lumbar region: Secondary | ICD-10-CM | POA: Diagnosis not present

## 2015-06-12 DIAGNOSIS — M5417 Radiculopathy, lumbosacral region: Secondary | ICD-10-CM | POA: Diagnosis not present

## 2015-06-12 DIAGNOSIS — M5137 Other intervertebral disc degeneration, lumbosacral region: Secondary | ICD-10-CM | POA: Diagnosis not present

## 2015-06-12 DIAGNOSIS — M4806 Spinal stenosis, lumbar region: Secondary | ICD-10-CM | POA: Diagnosis not present

## 2015-06-12 DIAGNOSIS — M792 Neuralgia and neuritis, unspecified: Secondary | ICD-10-CM | POA: Diagnosis not present

## 2015-06-12 DIAGNOSIS — M4726 Other spondylosis with radiculopathy, lumbar region: Secondary | ICD-10-CM | POA: Diagnosis not present

## 2015-06-12 DIAGNOSIS — M79642 Pain in left hand: Secondary | ICD-10-CM | POA: Diagnosis not present

## 2015-06-18 DIAGNOSIS — M4806 Spinal stenosis, lumbar region: Secondary | ICD-10-CM | POA: Diagnosis not present

## 2015-06-18 DIAGNOSIS — M5126 Other intervertebral disc displacement, lumbar region: Secondary | ICD-10-CM | POA: Diagnosis not present

## 2015-06-18 DIAGNOSIS — M545 Low back pain: Secondary | ICD-10-CM | POA: Diagnosis not present

## 2015-06-18 DIAGNOSIS — M4726 Other spondylosis with radiculopathy, lumbar region: Secondary | ICD-10-CM | POA: Diagnosis not present

## 2015-06-18 DIAGNOSIS — M5137 Other intervertebral disc degeneration, lumbosacral region: Secondary | ICD-10-CM | POA: Diagnosis not present

## 2015-06-18 DIAGNOSIS — M5417 Radiculopathy, lumbosacral region: Secondary | ICD-10-CM | POA: Diagnosis not present

## 2015-06-21 ENCOUNTER — Ambulatory Visit (HOSPITAL_COMMUNITY)
Admission: RE | Admit: 2015-06-21 | Discharge: 2015-06-21 | Disposition: A | Discharge status: Home / Self Care | Payer: Medicare Other | Source: Ambulatory Visit | Attending: Pulmonary Disease | Admitting: Pulmonary Disease

## 2015-06-21 DIAGNOSIS — R200 Anesthesia of skin: Secondary | ICD-10-CM | POA: Diagnosis not present

## 2015-06-21 DIAGNOSIS — I638 Other cerebral infarction: Secondary | ICD-10-CM | POA: Diagnosis not present

## 2015-06-21 DIAGNOSIS — G467 Other lacunar syndromes: Secondary | ICD-10-CM

## 2015-06-21 DIAGNOSIS — R202 Paresthesia of skin: Secondary | ICD-10-CM | POA: Diagnosis not present

## 2015-06-21 DIAGNOSIS — I639 Cerebral infarction, unspecified: Secondary | ICD-10-CM | POA: Diagnosis not present

## 2015-06-24 ENCOUNTER — Other Ambulatory Visit (HOSPITAL_COMMUNITY): Payer: Self-pay | Admitting: Pulmonary Disease

## 2015-06-24 DIAGNOSIS — I63239 Cerebral infarction due to unspecified occlusion or stenosis of unspecified carotid arteries: Secondary | ICD-10-CM

## 2015-06-24 DIAGNOSIS — E1122 Type 2 diabetes mellitus with diabetic chronic kidney disease: Secondary | ICD-10-CM | POA: Diagnosis not present

## 2015-06-24 DIAGNOSIS — E119 Type 2 diabetes mellitus without complications: Secondary | ICD-10-CM | POA: Diagnosis not present

## 2015-06-24 DIAGNOSIS — E785 Hyperlipidemia, unspecified: Secondary | ICD-10-CM | POA: Diagnosis not present

## 2015-06-24 DIAGNOSIS — I10 Essential (primary) hypertension: Secondary | ICD-10-CM | POA: Diagnosis not present

## 2015-06-24 DIAGNOSIS — I639 Cerebral infarction, unspecified: Secondary | ICD-10-CM | POA: Diagnosis not present

## 2015-06-26 ENCOUNTER — Ambulatory Visit (HOSPITAL_COMMUNITY): Payer: Federal, State, Local not specified - PPO

## 2015-06-26 ENCOUNTER — Ambulatory Visit (HOSPITAL_COMMUNITY)
Admission: RE | Admit: 2015-06-26 | Discharge: 2015-06-26 | Disposition: A | Discharge status: Home / Self Care | Payer: Medicare Other | Source: Ambulatory Visit | Attending: Pulmonary Disease | Admitting: Pulmonary Disease

## 2015-06-26 DIAGNOSIS — Z794 Long term (current) use of insulin: Secondary | ICD-10-CM | POA: Diagnosis not present

## 2015-06-26 DIAGNOSIS — I6350 Cerebral infarction due to unspecified occlusion or stenosis of unspecified cerebral artery: Secondary | ICD-10-CM | POA: Diagnosis not present

## 2015-06-26 DIAGNOSIS — I10 Essential (primary) hypertension: Secondary | ICD-10-CM

## 2015-06-26 DIAGNOSIS — E119 Type 2 diabetes mellitus without complications: Secondary | ICD-10-CM | POA: Diagnosis not present

## 2015-06-26 DIAGNOSIS — I639 Cerebral infarction, unspecified: Secondary | ICD-10-CM | POA: Diagnosis not present

## 2015-06-26 DIAGNOSIS — I6523 Occlusion and stenosis of bilateral carotid arteries: Secondary | ICD-10-CM | POA: Diagnosis not present

## 2015-06-26 DIAGNOSIS — I63239 Cerebral infarction due to unspecified occlusion or stenosis of unspecified carotid arteries: Secondary | ICD-10-CM

## 2015-07-01 ENCOUNTER — Other Ambulatory Visit: Payer: Self-pay | Admitting: *Deleted

## 2015-07-01 ENCOUNTER — Encounter: Payer: Self-pay | Admitting: Vascular Surgery

## 2015-07-01 DIAGNOSIS — I6521 Occlusion and stenosis of right carotid artery: Secondary | ICD-10-CM

## 2015-07-02 ENCOUNTER — Ambulatory Visit (INDEPENDENT_AMBULATORY_CARE_PROVIDER_SITE_OTHER): Payer: Medicare Other | Admitting: Vascular Surgery

## 2015-07-02 ENCOUNTER — Encounter: Payer: Self-pay | Admitting: Vascular Surgery

## 2015-07-02 ENCOUNTER — Ambulatory Visit (HOSPITAL_COMMUNITY)
Admission: RE | Admit: 2015-07-02 | Discharge: 2015-07-02 | Disposition: A | Discharge status: Home / Self Care | Payer: Medicare Other | Source: Ambulatory Visit | Attending: Vascular Surgery | Admitting: Vascular Surgery

## 2015-07-02 VITALS — BP 167/84 | HR 71 | Temp 98.3°F | Resp 16 | Ht 61.5 in | Wt 192.0 lb

## 2015-07-02 DIAGNOSIS — I6523 Occlusion and stenosis of bilateral carotid arteries: Secondary | ICD-10-CM | POA: Diagnosis not present

## 2015-07-02 DIAGNOSIS — E785 Hyperlipidemia, unspecified: Secondary | ICD-10-CM | POA: Diagnosis not present

## 2015-07-02 DIAGNOSIS — I6529 Occlusion and stenosis of unspecified carotid artery: Secondary | ICD-10-CM | POA: Insufficient documentation

## 2015-07-02 DIAGNOSIS — I10 Essential (primary) hypertension: Secondary | ICD-10-CM | POA: Diagnosis not present

## 2015-07-02 DIAGNOSIS — E119 Type 2 diabetes mellitus without complications: Secondary | ICD-10-CM | POA: Insufficient documentation

## 2015-07-02 DIAGNOSIS — I6521 Occlusion and stenosis of right carotid artery: Secondary | ICD-10-CM | POA: Diagnosis not present

## 2015-07-02 NOTE — Progress Notes (Signed)
Subjective:     Patient ID: Stacy [name], female   DOB: October 20, 1955, 59 y.o.   MRN: [id number]  HPI this 59 year old female was referred by Dr. Velvet Bathe for possible carotid occlusive disease. Patient recently had spinal injection for chronic back pain about 3 weeks ago. 2 days later she awoke with numbness in her left thumb index finger and middle finger. She has had some mild clumsiness in the left hand. She has no other neurologic symptoms. She has no history of stroke, lateralizing weakness, a fascia, amaurosis fugax, diplopia, blurred vision, or syncope. MRI was performed which revealed multiple areas of embolic-appearing ischemia to the right hemisphere. Carotid duplex exam was performed at week which suggested a possible right ICA stenosis but this was not definitive and there was no significant elevation and velocities. She was referred here for further evaluation. She has been on Plavix for the past week but was not on an eye platelet drugs prior to this event.  Past Medical History  Diagnosis Date  . HTN (hypertension)   . Hyperlipidemia   . Diabetes mellitus   . Osteoarthritis   . Chronic back pain     Social History  Substance Use Topics  . Smoking status: Never Smoker   . Smokeless tobacco: Never Used  . Alcohol Use: No    Family History  Problem Relation Age of Onset  . Liver disease Neg Hx   . Colon cancer Neg Hx   . GI problems Neg Hx   . Diabetes Mother   . Heart disease Mother   . Hyperlipidemia Mother   . Hypertension Mother   . Heart attack Mother   . Diabetes Father   . Heart disease Father   . Hyperlipidemia Father   . Hypertension Father   . Heart attack Father   . Heart disease Brother     No Known Allergies   Current outpatient prescriptions:  .  amLODipine (NORVASC) 5 MG tablet, Take 5 mg by mouth daily., Disp: , Rfl:  .  atorvastatin (LIPITOR) 80 MG tablet, Take 80 mg by mouth daily., Disp: , Rfl:  .  B-D UF III MINI PEN NEEDLES 31G X 5 MM  MISC, AS DIRECTED DX:250.0, Disp: 100 each, Rfl: 10 .  clopidogrel (PLAVIX) 75 MG tablet, Take 75 mg by mouth daily., Disp: , Rfl:  .  FREESTYLE TEST STRIPS test strip, CHECK CBG THREE TIMES DAILY, Disp: 100 each, Rfl: 8 .  insulin detemir (LEVEMIR) 100 UNIT/ML injection, Inject into the skin at bedtime., Disp: , Rfl:  .  losartan-hydrochlorothiazide (HYZAAR) 50-12.5 MG per tablet, Take 1 tablet by mouth daily., Disp: , Rfl:  .  omeprazole (PRILOSEC) 20 MG capsule, Take 20 mg by mouth daily., Disp: , Rfl:  .  aspirin 81 MG tablet, Take 81 mg by mouth daily.  , Disp: , Rfl:  .  Canagliflozin (INVOKANA) 100 MG TABS, Take by mouth., Disp: , Rfl:  .  insulin aspart protamine-insulin aspart (NOVOLOG MIX 70/30 FLEXPEN) (70-30) 100 UNIT/ML injection, 42 units with breakfast and 40 Units at dinner, Disp: 54 mL, Rfl: 3 .  insulin lispro (HUMALOG) 100 UNIT/ML injection, Inject into the skin 3 (three) times daily before meals., Disp: , Rfl:  .  losartan-hydrochlorothiazide (HYZAAR) 50-12.5 MG per tablet, Take 1 tablet by mouth daily., Disp: 30 tablet, Rfl: 11 .  simvastatin (ZOCOR) 20 MG tablet, Take 20 mg by mouth every evening., Disp: , Rfl:   Filed Vitals:   07/02/15 0921 07/02/15  [id number] 07/02/15 0926  BP: 171/80 167/78 167/84  Pulse: 71 71 71  Temp: 98.3 F (36.8 C)    Resp: 16    Height: 5' 1.5" (1.562 m)    Weight: 192 lb (87.091 kg)    SpO2: 98%      Body mass index is 35.7 kg/(m^2).           Review of Systems denies chest pain, dyspnea on exertion, PND, orthopnea, hemoptysis. Does have chronic obesity, diabetes mellitus. Denies claudication. Other systems negative and a complete review of systems other than chronic significant back discomfort from her lumbar spine disease     Objective:   Physical Exam BP 167/84 mmHg  Pulse 71  Temp(Src) 98.3 F (36.8 C)  Resp 16  Ht 5' 1.5" (1.562 m)  Wt 192 lb (87.091 kg)  BMI 35.70 kg/m2  SpO2 98%  Gen.-alert and oriented x3 in no  apparent distress-obese HEENT normal for age Lungs no rhonchi or wheezing Cardiovascular regular rhythm no murmurs carotid pulses 3+ palpable no bruits audible Abdomen soft nontender no palpable masses Musculoskeletal free of  major deformities Skin clear -no rashes Neurologic normal Lower extremities 3+ femoral and dorsalis pedis pulses palpable bilaterally with no edema  Today I ordered a carotid duplex exam which I reviewed and interpreted. Also reviewed the previous study performed at [facility] on 06/26/2015. The study done in our lab has no elevated velocities in the right carotid system and has some mild plaque formation but no ulceration noted. There is no evidence of any significant ICA stenosis in either carotid artery.       Assessment:     Recent possible embolic stroke right brain by MRI No evidence of significant carotid occlusive disease in our study today  Type 2 diabetes mellitus Osteoarthritis lumbar spine     Plan:     The previous carotid study done last week elsewhere suggest there could be a stenosis at the origin of the internal carotid artery but our study does not support this Would recommend continuing antiplatelets therapy-Plavix and we will see patient back in 6 months and repeat carotid duplex exam She develops any neurologic symptoms in the interim she will be in touch with [place]

## 2015-07-02 NOTE — Progress Notes (Signed)
Filed Vitals:   07/02/15 0921 07/02/15 0922 07/02/15 0926  BP: 171/80 167/78 167/84  Pulse: 71 71 71  Temp: 98.3 F (36.8 C)    Resp: 16    Height: 5' 1.5" (1.562 m)    Weight: 192 lb (87.091 kg)    SpO2: 98%

## 2015-07-03 DIAGNOSIS — M4806 Spinal stenosis, lumbar region: Secondary | ICD-10-CM | POA: Diagnosis not present

## 2015-07-03 DIAGNOSIS — M545 Low back pain: Secondary | ICD-10-CM | POA: Diagnosis not present

## 2015-07-03 DIAGNOSIS — E1165 Type 2 diabetes mellitus with hyperglycemia: Secondary | ICD-10-CM | POA: Diagnosis not present

## 2015-07-03 DIAGNOSIS — M5137 Other intervertebral disc degeneration, lumbosacral region: Secondary | ICD-10-CM | POA: Diagnosis not present

## 2015-07-03 DIAGNOSIS — M79652 Pain in left thigh: Secondary | ICD-10-CM | POA: Diagnosis not present

## 2015-07-03 DIAGNOSIS — E785 Hyperlipidemia, unspecified: Secondary | ICD-10-CM | POA: Diagnosis not present

## 2015-07-03 DIAGNOSIS — M5417 Radiculopathy, lumbosacral region: Secondary | ICD-10-CM | POA: Diagnosis not present

## 2015-07-03 DIAGNOSIS — M79651 Pain in right thigh: Secondary | ICD-10-CM | POA: Diagnosis not present

## 2015-07-03 DIAGNOSIS — M792 Neuralgia and neuritis, unspecified: Secondary | ICD-10-CM | POA: Diagnosis not present

## 2015-07-03 DIAGNOSIS — I10 Essential (primary) hypertension: Secondary | ICD-10-CM | POA: Diagnosis not present

## 2015-07-03 DIAGNOSIS — M4726 Other spondylosis with radiculopathy, lumbar region: Secondary | ICD-10-CM | POA: Diagnosis not present

## 2015-07-22 ENCOUNTER — Telehealth: Payer: Self-pay | Admitting: Gastroenterology

## 2015-07-22 NOTE — Telephone Encounter (Signed)
ON RECALL FOR TCS °

## 2015-07-23 NOTE — Telephone Encounter (Signed)
Letter mailed to pt.  

## 2015-08-08 DIAGNOSIS — M4726 Other spondylosis with radiculopathy, lumbar region: Secondary | ICD-10-CM | POA: Diagnosis not present

## 2015-08-08 DIAGNOSIS — M5137 Other intervertebral disc degeneration, lumbosacral region: Secondary | ICD-10-CM | POA: Diagnosis not present

## 2015-08-08 DIAGNOSIS — I69154 Hemiplegia and hemiparesis following nontraumatic intracerebral hemorrhage affecting left non-dominant side: Secondary | ICD-10-CM | POA: Diagnosis not present

## 2015-08-08 DIAGNOSIS — M4806 Spinal stenosis, lumbar region: Secondary | ICD-10-CM | POA: Diagnosis not present

## 2015-08-08 DIAGNOSIS — M5417 Radiculopathy, lumbosacral region: Secondary | ICD-10-CM | POA: Diagnosis not present

## 2015-08-08 DIAGNOSIS — M545 Low back pain: Secondary | ICD-10-CM | POA: Diagnosis not present

## 2015-08-14 DIAGNOSIS — M62542 Muscle wasting and atrophy, not elsewhere classified, left hand: Secondary | ICD-10-CM | POA: Diagnosis not present

## 2015-08-14 DIAGNOSIS — G8194 Hemiplegia, unspecified affecting left nondominant side: Secondary | ICD-10-CM | POA: Diagnosis not present

## 2015-08-21 DIAGNOSIS — M62542 Muscle wasting and atrophy, not elsewhere classified, left hand: Secondary | ICD-10-CM | POA: Diagnosis not present

## 2015-08-21 DIAGNOSIS — G8194 Hemiplegia, unspecified affecting left nondominant side: Secondary | ICD-10-CM | POA: Diagnosis not present

## 2015-08-22 DIAGNOSIS — M62542 Muscle wasting and atrophy, not elsewhere classified, left hand: Secondary | ICD-10-CM | POA: Diagnosis not present

## 2015-08-22 DIAGNOSIS — G8194 Hemiplegia, unspecified affecting left nondominant side: Secondary | ICD-10-CM | POA: Diagnosis not present

## 2015-08-26 DIAGNOSIS — G8194 Hemiplegia, unspecified affecting left nondominant side: Secondary | ICD-10-CM | POA: Diagnosis not present

## 2015-08-26 DIAGNOSIS — M62542 Muscle wasting and atrophy, not elsewhere classified, left hand: Secondary | ICD-10-CM | POA: Diagnosis not present

## 2015-08-29 DIAGNOSIS — G8194 Hemiplegia, unspecified affecting left nondominant side: Secondary | ICD-10-CM | POA: Diagnosis not present

## 2015-08-29 DIAGNOSIS — M62542 Muscle wasting and atrophy, not elsewhere classified, left hand: Secondary | ICD-10-CM | POA: Diagnosis not present

## 2015-09-02 DIAGNOSIS — G8194 Hemiplegia, unspecified affecting left nondominant side: Secondary | ICD-10-CM | POA: Diagnosis not present

## 2015-09-02 DIAGNOSIS — M62542 Muscle wasting and atrophy, not elsewhere classified, left hand: Secondary | ICD-10-CM | POA: Diagnosis not present

## 2015-09-04 DIAGNOSIS — M9903 Segmental and somatic dysfunction of lumbar region: Secondary | ICD-10-CM | POA: Diagnosis not present

## 2015-09-04 DIAGNOSIS — M4806 Spinal stenosis, lumbar region: Secondary | ICD-10-CM | POA: Diagnosis not present

## 2015-09-04 DIAGNOSIS — M5441 Lumbago with sciatica, right side: Secondary | ICD-10-CM | POA: Diagnosis not present

## 2015-09-04 DIAGNOSIS — M9905 Segmental and somatic dysfunction of pelvic region: Secondary | ICD-10-CM | POA: Diagnosis not present

## 2015-09-09 DIAGNOSIS — M9903 Segmental and somatic dysfunction of lumbar region: Secondary | ICD-10-CM | POA: Diagnosis not present

## 2015-09-09 DIAGNOSIS — M5441 Lumbago with sciatica, right side: Secondary | ICD-10-CM | POA: Diagnosis not present

## 2015-09-09 DIAGNOSIS — M9905 Segmental and somatic dysfunction of pelvic region: Secondary | ICD-10-CM | POA: Diagnosis not present

## 2015-09-09 DIAGNOSIS — G8194 Hemiplegia, unspecified affecting left nondominant side: Secondary | ICD-10-CM | POA: Diagnosis not present

## 2015-09-09 DIAGNOSIS — M62542 Muscle wasting and atrophy, not elsewhere classified, left hand: Secondary | ICD-10-CM | POA: Diagnosis not present

## 2015-09-09 DIAGNOSIS — M4806 Spinal stenosis, lumbar region: Secondary | ICD-10-CM | POA: Diagnosis not present

## 2015-09-11 DIAGNOSIS — M5441 Lumbago with sciatica, right side: Secondary | ICD-10-CM | POA: Diagnosis not present

## 2015-09-11 DIAGNOSIS — M9903 Segmental and somatic dysfunction of lumbar region: Secondary | ICD-10-CM | POA: Diagnosis not present

## 2015-09-11 DIAGNOSIS — M4806 Spinal stenosis, lumbar region: Secondary | ICD-10-CM | POA: Diagnosis not present

## 2015-09-11 DIAGNOSIS — M9905 Segmental and somatic dysfunction of pelvic region: Secondary | ICD-10-CM | POA: Diagnosis not present

## 2015-09-12 DIAGNOSIS — G8194 Hemiplegia, unspecified affecting left nondominant side: Secondary | ICD-10-CM | POA: Diagnosis not present

## 2015-09-12 DIAGNOSIS — M62542 Muscle wasting and atrophy, not elsewhere classified, left hand: Secondary | ICD-10-CM | POA: Diagnosis not present

## 2015-09-13 DIAGNOSIS — M5441 Lumbago with sciatica, right side: Secondary | ICD-10-CM | POA: Diagnosis not present

## 2015-09-13 DIAGNOSIS — M9905 Segmental and somatic dysfunction of pelvic region: Secondary | ICD-10-CM | POA: Diagnosis not present

## 2015-09-13 DIAGNOSIS — M4806 Spinal stenosis, lumbar region: Secondary | ICD-10-CM | POA: Diagnosis not present

## 2015-09-13 DIAGNOSIS — M9903 Segmental and somatic dysfunction of lumbar region: Secondary | ICD-10-CM | POA: Diagnosis not present

## 2015-09-16 DIAGNOSIS — M4806 Spinal stenosis, lumbar region: Secondary | ICD-10-CM | POA: Diagnosis not present

## 2015-09-16 DIAGNOSIS — M5441 Lumbago with sciatica, right side: Secondary | ICD-10-CM | POA: Diagnosis not present

## 2015-09-16 DIAGNOSIS — M9903 Segmental and somatic dysfunction of lumbar region: Secondary | ICD-10-CM | POA: Diagnosis not present

## 2015-09-16 DIAGNOSIS — G8194 Hemiplegia, unspecified affecting left nondominant side: Secondary | ICD-10-CM | POA: Diagnosis not present

## 2015-09-16 DIAGNOSIS — M62542 Muscle wasting and atrophy, not elsewhere classified, left hand: Secondary | ICD-10-CM | POA: Diagnosis not present

## 2015-09-16 DIAGNOSIS — M9905 Segmental and somatic dysfunction of pelvic region: Secondary | ICD-10-CM | POA: Diagnosis not present

## 2015-09-17 DIAGNOSIS — I10 Essential (primary) hypertension: Secondary | ICD-10-CM | POA: Diagnosis not present

## 2015-09-17 DIAGNOSIS — E1165 Type 2 diabetes mellitus with hyperglycemia: Secondary | ICD-10-CM | POA: Diagnosis not present

## 2015-09-17 DIAGNOSIS — E785 Hyperlipidemia, unspecified: Secondary | ICD-10-CM | POA: Diagnosis not present

## 2015-09-17 LAB — HEMOGLOBIN A1C: HEMOGLOBIN A1C: 9.5 % — AB (ref 4.0–6.0)

## 2015-09-18 DIAGNOSIS — M9905 Segmental and somatic dysfunction of pelvic region: Secondary | ICD-10-CM | POA: Diagnosis not present

## 2015-09-18 DIAGNOSIS — M5441 Lumbago with sciatica, right side: Secondary | ICD-10-CM | POA: Diagnosis not present

## 2015-09-18 DIAGNOSIS — M4806 Spinal stenosis, lumbar region: Secondary | ICD-10-CM | POA: Diagnosis not present

## 2015-09-18 DIAGNOSIS — M9903 Segmental and somatic dysfunction of lumbar region: Secondary | ICD-10-CM | POA: Diagnosis not present

## 2015-09-19 DIAGNOSIS — M62542 Muscle wasting and atrophy, not elsewhere classified, left hand: Secondary | ICD-10-CM | POA: Diagnosis not present

## 2015-09-19 DIAGNOSIS — G8194 Hemiplegia, unspecified affecting left nondominant side: Secondary | ICD-10-CM | POA: Diagnosis not present

## 2015-09-20 DIAGNOSIS — M4806 Spinal stenosis, lumbar region: Secondary | ICD-10-CM | POA: Diagnosis not present

## 2015-09-20 DIAGNOSIS — M5441 Lumbago with sciatica, right side: Secondary | ICD-10-CM | POA: Diagnosis not present

## 2015-09-20 DIAGNOSIS — M9903 Segmental and somatic dysfunction of lumbar region: Secondary | ICD-10-CM | POA: Diagnosis not present

## 2015-09-20 DIAGNOSIS — M9905 Segmental and somatic dysfunction of pelvic region: Secondary | ICD-10-CM | POA: Diagnosis not present

## 2015-09-23 DIAGNOSIS — M9905 Segmental and somatic dysfunction of pelvic region: Secondary | ICD-10-CM | POA: Diagnosis not present

## 2015-09-23 DIAGNOSIS — M4806 Spinal stenosis, lumbar region: Secondary | ICD-10-CM | POA: Diagnosis not present

## 2015-09-23 DIAGNOSIS — M5441 Lumbago with sciatica, right side: Secondary | ICD-10-CM | POA: Diagnosis not present

## 2015-09-23 DIAGNOSIS — M9903 Segmental and somatic dysfunction of lumbar region: Secondary | ICD-10-CM | POA: Diagnosis not present

## 2015-09-24 ENCOUNTER — Ambulatory Visit (INDEPENDENT_AMBULATORY_CARE_PROVIDER_SITE_OTHER): Payer: Medicare Other | Admitting: "Endocrinology

## 2015-09-24 ENCOUNTER — Encounter: Payer: Self-pay | Admitting: "Endocrinology

## 2015-09-24 VITALS — BP 149/76 | HR 85 | Ht 61.0 in | Wt 201.0 lb

## 2015-09-24 DIAGNOSIS — IMO0002 Reserved for concepts with insufficient information to code with codable children: Secondary | ICD-10-CM

## 2015-09-24 DIAGNOSIS — I10 Essential (primary) hypertension: Secondary | ICD-10-CM | POA: Diagnosis not present

## 2015-09-24 DIAGNOSIS — Z794 Long term (current) use of insulin: Secondary | ICD-10-CM | POA: Diagnosis not present

## 2015-09-24 DIAGNOSIS — I6523 Occlusion and stenosis of bilateral carotid arteries: Secondary | ICD-10-CM

## 2015-09-24 DIAGNOSIS — E785 Hyperlipidemia, unspecified: Secondary | ICD-10-CM

## 2015-09-24 DIAGNOSIS — E1159 Type 2 diabetes mellitus with other circulatory complications: Secondary | ICD-10-CM

## 2015-09-24 DIAGNOSIS — E1165 Type 2 diabetes mellitus with hyperglycemia: Secondary | ICD-10-CM | POA: Diagnosis not present

## 2015-09-24 MED ORDER — SIMVASTATIN 20 MG PO TABS: 20.0000 mg | ORAL_TABLET | Freq: Every day | ORAL | Status: DC

## 2015-09-24 MED ORDER — INSULIN DETEMIR 100 UNIT/ML FLEXPEN: 60.0000 [IU] | PEN_INJECTOR | Freq: Every day | SUBCUTANEOUS | Status: DC

## 2015-09-24 MED ORDER — INSULIN ASPART 100 UNIT/ML FLEXPEN: 10.0000 [IU] | PEN_INJECTOR | Freq: Three times a day (TID) | SUBCUTANEOUS | Status: DC

## 2015-09-24 MED ORDER — LOSARTAN POTASSIUM-HCTZ 50-12.5 MG PO TABS: 1.0000 | ORAL_TABLET | Freq: Every day | ORAL | Status: DC

## 2015-09-24 NOTE — Progress Notes (Signed)
Subjective:    Patient ID: Stacy [name], female    DOB: [birth year], PCP Stacy Bogus, Stacy [name]   Past Medical History  Diagnosis Date  . HTN (hypertension)   . Hyperlipidemia   . Diabetes mellitus   . Osteoarthritis   . Chronic back pain    Past Surgical History  Procedure Laterality Date  . Carpel tunnel release      bilateral  . Cesarean section    . Colonoscopy  2006    normal, Dr. Benson Norway  . Esophagogastroduodenoscopy  2006    normal, Dr. Benson Norway   Social History   Social History  . Marital Status: Divorced    Spouse Name: N/A  . Number of Children: N/A  . Years of Education: N/A   Occupational History  .  [employer]    out due to back injury   Social History Main Topics  . Smoking status: Never Smoker   . Smokeless tobacco: Never Used  . Alcohol Use: No  . Drug Use: No  . Sexual Activity: No   Other Topics Concern  . None   Social History Narrative   Outpatient Encounter Prescriptions as of 09/24/2015  Medication Sig  . amLODipine (NORVASC) 5 MG tablet Take 5 mg by mouth daily.  . clopidogrel (PLAVIX) 75 MG tablet Take 75 mg by mouth daily.  . insulin aspart (NOVOLOG FLEXPEN) 100 UNIT/ML FlexPen Inject 10-16 Units into the skin 3 (three) times daily with meals.  . Insulin Detemir (LEVEMIR FLEXTOUCH) 100 UNIT/ML Pen Inject 60 Units into the skin at bedtime.  . [DISCONTINUED] insulin aspart (NOVOLOG FLEXPEN) 100 UNIT/ML FlexPen Inject 10-16 Units into the skin 3 (three) times daily with meals.  . [DISCONTINUED] Insulin Detemir (LEVEMIR FLEXTOUCH [place]) Inject 60 Units into the skin at bedtime.  . [DISCONTINUED] insulin detemir (LEVEMIR) 100 UNIT/ML injection Inject 60 Units into the skin at bedtime.  . [DISCONTINUED] losartan-hydrochlorothiazide (HYZAAR) 50-12.5 MG per tablet Take 1 tablet by mouth daily.  Marland Kitchen aspirin 81 MG tablet Take 81 mg by mouth daily.    . B-D UF III MINI PEN NEEDLES 31G X 5 MM MISC AS DIRECTED DX:250.0  . FREESTYLE TEST STRIPS test  strip CHECK CBG THREE TIMES DAILY  . losartan-hydrochlorothiazide (HYZAAR) 50-12.5 MG tablet Take 1 tablet by mouth daily.  Marland Kitchen omeprazole (PRILOSEC) 20 MG capsule Take 20 mg by mouth daily.  . simvastatin (ZOCOR) 20 MG tablet Take 1 tablet (20 mg total) by mouth at bedtime.  . [DISCONTINUED] atorvastatin (LIPITOR) 80 MG tablet Take 80 mg by mouth daily.  . [DISCONTINUED] Canagliflozin (INVOKANA) 100 MG TABS Take by mouth.  . [DISCONTINUED] insulin aspart protamine-insulin aspart (NOVOLOG MIX 70/30 FLEXPEN) (70-30) 100 UNIT/ML injection 42 units with breakfast and 40 Units at dinner  . [DISCONTINUED] insulin lispro (HUMALOG) 100 UNIT/ML injection Inject into the skin 3 (three) times daily before meals.  . [DISCONTINUED] losartan-hydrochlorothiazide (HYZAAR) 50-12.5 MG per tablet Take 1 tablet by mouth daily.  . [DISCONTINUED] simvastatin (ZOCOR) 20 MG tablet Take 20 mg by mouth every evening.   No facility-administered encounter medications on file as of 09/24/2015.   ALLERGIES: No Known Allergies VACCINATION STATUS: Immunization History  Administered Date(s) Administered  . Influenza Whole 07/13/2007, 08/07/2009  . Pneumococcal Polysaccharide-23 04/22/2012  . Td 01/11/2007    Diabetes She presents for her follow-up diabetic visit. She has type 2 diabetes mellitus. Onset time: She was diagnosed at approximate age of 41 years. Her disease course has been improving.  There are no hypoglycemic associated symptoms. Pertinent negatives for hypoglycemia include no confusion, headaches, pallor or seizures. Associated symptoms include polyphagia and polyuria. Pertinent negatives for diabetes include no chest pain and no polydipsia. There are no hypoglycemic complications. Symptoms are improving. Diabetic complications include a CVA and nephropathy. Risk factors for coronary artery disease include diabetes mellitus, dyslipidemia, hypertension, obesity and sedentary lifestyle. Current diabetic treatment  includes intensive insulin program. Compliance with diabetes treatment: She has history of noncompliance until this visit. Her weight is stable. She is following a generally unhealthy diet. When asked about meal planning, she reported none. She has not had a previous visit with a dietitian. She never (She has chronic back problem.) participates in exercise. Her home blood glucose trend is decreasing steadily. Her breakfast blood glucose range is generally 180-200 mg/dl. Her lunch blood glucose range is generally 180-200 mg/dl. Her dinner blood glucose range is generally 180-200 mg/dl. Her overall blood glucose range is 180-200 mg/dl. An ACE inhibitor/angiotensin II receptor blocker is being taken.  Hyperlipidemia This is a chronic problem. The current episode started more than 1 year ago. Exacerbating diseases include diabetes and obesity. Pertinent negatives include no chest pain, leg pain, myalgias or shortness of breath. Current antihyperlipidemic treatment includes statins. Compliance problems: She had history of noncompliance.  Risk factors for coronary artery disease include dyslipidemia, diabetes mellitus, hypertension, obesity and a sedentary lifestyle.  Hypertension This is a chronic problem. The current episode started more than 1 year ago. The problem is uncontrolled. Pertinent negatives include no chest pain, headaches, palpitations or shortness of breath. Risk factors for coronary artery disease include diabetes mellitus, dyslipidemia, obesity and sedentary lifestyle. Past treatments include angiotensin blockers. Hypertensive end-organ damage includes CVA.     Review of Systems  Constitutional: Negative for unexpected weight change.  HENT: Negative for trouble swallowing and voice change.   Eyes: Negative for visual disturbance.  Respiratory: Negative for cough, shortness of breath and wheezing.   Cardiovascular: Negative for chest pain, palpitations and leg swelling.  Gastrointestinal:  Negative for nausea, vomiting and diarrhea.  Endocrine: Positive for polyphagia and polyuria. Negative for cold intolerance, heat intolerance and polydipsia.  Musculoskeletal: Negative for myalgias and arthralgias.  Skin: Negative for color change, pallor, rash and wound.  Neurological: Negative for seizures and headaches.  Psychiatric/Behavioral: Negative for suicidal ideas and confusion.    Objective:    BP 149/76 mmHg  Pulse 85  Ht 5\' 1"  (1.549 m)  Wt 201 lb (91.173 kg)  BMI 38.00 kg/m2  SpO2 100%  Wt Readings from Last 3 Encounters:  09/24/15 201 lb (91.173 kg)  07/02/15 192 lb (87.091 kg)  06/21/15 189 lb (85.73 kg)    Physical Exam  Constitutional: She is oriented to person, place, and time. She appears well-developed.  HENT:  Head: Normocephalic and atraumatic.  Eyes: EOM are normal.  Neck: Normal range of motion. Neck supple. No tracheal deviation present. No thyromegaly present.  Cardiovascular: Normal rate and regular rhythm.   Pulmonary/Chest: Effort normal and breath sounds normal.  Abdominal: Soft. Bowel sounds are normal. There is no tenderness. There is no guarding.  Musculoskeletal: Normal range of motion. She exhibits no edema.  Neurological: She is alert and oriented to person, place, and time. She has normal reflexes. No cranial nerve deficit. Coordination normal.  Skin: Skin is warm and dry. No rash noted. No erythema. No pallor.  Psychiatric: She has a normal mood and affect. Judgment normal.    Results for orders placed or  performed in visit on 09/24/15  Hemoglobin A1c  Result Value Ref Range   Hgb A1c MFr Bld 9.5 (A) 4.0 - 6.0 %   Complete Blood Count (Most recent): No results found for: WBC, HGB, HCT, MCV, PLT Chemistry (most recent): Lab Results  Component Value Date   NA 136 05/02/2012   K 3.9 05/02/2012   CL 100 05/02/2012   CO2 28 05/02/2012   BUN 10 05/02/2012   CREATININE 0.7 05/02/2012   Diabetic Labs (most recent): Lab Results   Component Value Date   HGBA1C 9.5* 09/17/2015   HGBA1C 10.8* 04/15/2012   HGBA1C 11.8* 05/26/2011   Lipid profile (most recent): Lab Results  Component Value Date   TRIG 144.0 05/02/2012   CHOL 208* 05/02/2012      Assessment & Plan:   1. Uncontrolled type 2 diabetes mellitus with other circulatory complication, with long-term current use of insulin ([place])   - her diabetes is  complicated by carotid stenosis and reversed mild CK D and patient remains at a high risk for more acute and chronic complications of diabetes which include CAD, CVA, CKD, retinopathy, and neuropathy. These are all discussed in detail with the patient.  Patient came with improved glucose profile, and  recent A1c of 9.5% improving from 11.2 %.  Glucose logs and insulin administration records pertaining to this visit,  to be scanned into patient's records.  Recent labs reviewed.   - I have re-counseled the patient on diet management and weight loss  by adopting a carbohydrate restricted / protein rich  Diet.  - Suggestion is made for patient to avoid simple carbohydrates   from their diet including Cakes , Desserts, Ice Cream,  Soda (  diet and regular) , Sweet Tea , Candies,  Chips, Cookies, Artificial Sweeteners,   and "Sugar-free" Products .  This will help patient to have stable blood glucose profile and potentially avoid unintended  Weight gain.  - Patient is advised to stick to a routine mealtimes to eat 3 meals  a day and avoid unnecessary snacks ( to snack only to correct hypoglycemia).  - The patient  will be  scheduled with Stacy [name], Stacy [name], Stacy [name] for individualized DM education.  - I have approached patient with the following individualized plan to manage diabetes and patient agrees.  -Continue Levemir 60 units qhs, and NovoLog 10 units TIDAC for premeal BG readings of 90-150mg /dl, plus patient specific sliding scale insulin for correction of unexpected hyperglycemia above 150mg /dl, associated with  strict monitoring of BG AC and HS.  -Adjustment parameters for hypo and hyperglycemia were given in a written document to patient. -Patient is encouraged to call clinic for blood glucose levels less than 70 or above 300 mg /dl. -Hold Invokana for now, she does not tolerate metformin.   - Patient specific target  for A1c; LDL, HDL, Triglycerides, and  Waist Circumference were discussed in detail.  2) BP/HTN: Uncontrolled due to the fact that she did not take her Hyzaar this morning. I urged her to resume and Continue current medications including ACEI/ARB. 3) Lipids/HPL:  She has not been taking her Zocor nor Lipitor or so both are on her medication list. I discontinued Lipitor and advised her to continue Zocor 20 mg by mouth daily at bedtime. Marland Kitchen  4)  Weight/Diet: I would initiate Stacy [name] consult, exercise, and carbohydrates information provided.  5) Chronic Care/Health Maintenance:  -Patient  Is  on ACEI/ARB and Statin medications and encouraged to continue to follow up with  Ophthalmology, Podiatrist at least yearly or according to recommendations, and advised to  stay away from smoking. I have recommended yearly flu vaccine and pneumonia vaccination at least every 5 years; moderate intensity exercise for up to 150 minutes weekly; and  sleep for at least 7 hours a day.  - 25 minutes of time was spent on the care of this patient , 50% of which was applied for counseling on diabetes complications and their preventions.  - I advised patient to maintain close follow up with Stacy [name],Stacy [name], Stacy [name] for primary care needs.  Patient is asked to bring meter and  blood glucose logs during their next visit.   Follow up plan: -Return in about 3 months (around 12/23/2015) for diabetes, high blood pressure, high cholesterol, follow up with pre-visit labs, meter, and logs.  Glade Lloyd, Stacy [name] Phone: [phone]  Fax: [phone]   09/24/2015, 10:40 AM

## 2015-09-24 NOTE — Patient Instructions (Signed)

## 2015-09-25 DIAGNOSIS — M4806 Spinal stenosis, lumbar region: Secondary | ICD-10-CM | POA: Diagnosis not present

## 2015-09-25 DIAGNOSIS — M5441 Lumbago with sciatica, right side: Secondary | ICD-10-CM | POA: Diagnosis not present

## 2015-09-25 DIAGNOSIS — G8194 Hemiplegia, unspecified affecting left nondominant side: Secondary | ICD-10-CM | POA: Diagnosis not present

## 2015-09-25 DIAGNOSIS — M9903 Segmental and somatic dysfunction of lumbar region: Secondary | ICD-10-CM | POA: Diagnosis not present

## 2015-09-25 DIAGNOSIS — M9905 Segmental and somatic dysfunction of pelvic region: Secondary | ICD-10-CM | POA: Diagnosis not present

## 2015-09-25 DIAGNOSIS — M62542 Muscle wasting and atrophy, not elsewhere classified, left hand: Secondary | ICD-10-CM | POA: Diagnosis not present

## 2015-09-27 DIAGNOSIS — M4806 Spinal stenosis, lumbar region: Secondary | ICD-10-CM | POA: Diagnosis not present

## 2015-09-27 DIAGNOSIS — M9905 Segmental and somatic dysfunction of pelvic region: Secondary | ICD-10-CM | POA: Diagnosis not present

## 2015-09-27 DIAGNOSIS — M9903 Segmental and somatic dysfunction of lumbar region: Secondary | ICD-10-CM | POA: Diagnosis not present

## 2015-09-27 DIAGNOSIS — M5441 Lumbago with sciatica, right side: Secondary | ICD-10-CM | POA: Diagnosis not present

## 2015-09-30 DIAGNOSIS — M5441 Lumbago with sciatica, right side: Secondary | ICD-10-CM | POA: Diagnosis not present

## 2015-09-30 DIAGNOSIS — M9903 Segmental and somatic dysfunction of lumbar region: Secondary | ICD-10-CM | POA: Diagnosis not present

## 2015-09-30 DIAGNOSIS — M9905 Segmental and somatic dysfunction of pelvic region: Secondary | ICD-10-CM | POA: Diagnosis not present

## 2015-09-30 DIAGNOSIS — M4806 Spinal stenosis, lumbar region: Secondary | ICD-10-CM | POA: Diagnosis not present

## 2015-10-02 DIAGNOSIS — M9905 Segmental and somatic dysfunction of pelvic region: Secondary | ICD-10-CM | POA: Diagnosis not present

## 2015-10-02 DIAGNOSIS — M62542 Muscle wasting and atrophy, not elsewhere classified, left hand: Secondary | ICD-10-CM | POA: Diagnosis not present

## 2015-10-02 DIAGNOSIS — M4806 Spinal stenosis, lumbar region: Secondary | ICD-10-CM | POA: Diagnosis not present

## 2015-10-02 DIAGNOSIS — G8194 Hemiplegia, unspecified affecting left nondominant side: Secondary | ICD-10-CM | POA: Diagnosis not present

## 2015-10-02 DIAGNOSIS — M9903 Segmental and somatic dysfunction of lumbar region: Secondary | ICD-10-CM | POA: Diagnosis not present

## 2015-10-02 DIAGNOSIS — M5441 Lumbago with sciatica, right side: Secondary | ICD-10-CM | POA: Diagnosis not present

## 2015-10-03 DIAGNOSIS — M545 Low back pain: Secondary | ICD-10-CM | POA: Diagnosis not present

## 2015-10-03 DIAGNOSIS — E119 Type 2 diabetes mellitus without complications: Secondary | ICD-10-CM | POA: Diagnosis not present

## 2015-10-03 DIAGNOSIS — I10 Essential (primary) hypertension: Secondary | ICD-10-CM | POA: Diagnosis not present

## 2015-10-03 DIAGNOSIS — I639 Cerebral infarction, unspecified: Secondary | ICD-10-CM | POA: Diagnosis not present

## 2015-10-04 DIAGNOSIS — M4806 Spinal stenosis, lumbar region: Secondary | ICD-10-CM | POA: Diagnosis not present

## 2015-10-04 DIAGNOSIS — M5441 Lumbago with sciatica, right side: Secondary | ICD-10-CM | POA: Diagnosis not present

## 2015-10-04 DIAGNOSIS — M9905 Segmental and somatic dysfunction of pelvic region: Secondary | ICD-10-CM | POA: Diagnosis not present

## 2015-10-04 DIAGNOSIS — M9903 Segmental and somatic dysfunction of lumbar region: Secondary | ICD-10-CM | POA: Diagnosis not present

## 2015-10-08 DIAGNOSIS — M5441 Lumbago with sciatica, right side: Secondary | ICD-10-CM | POA: Diagnosis not present

## 2015-10-08 DIAGNOSIS — M9905 Segmental and somatic dysfunction of pelvic region: Secondary | ICD-10-CM | POA: Diagnosis not present

## 2015-10-08 DIAGNOSIS — M9903 Segmental and somatic dysfunction of lumbar region: Secondary | ICD-10-CM | POA: Diagnosis not present

## 2015-10-08 DIAGNOSIS — M4806 Spinal stenosis, lumbar region: Secondary | ICD-10-CM | POA: Diagnosis not present

## 2015-10-11 DIAGNOSIS — M9905 Segmental and somatic dysfunction of pelvic region: Secondary | ICD-10-CM | POA: Diagnosis not present

## 2015-10-11 DIAGNOSIS — M9903 Segmental and somatic dysfunction of lumbar region: Secondary | ICD-10-CM | POA: Diagnosis not present

## 2015-10-11 DIAGNOSIS — M4806 Spinal stenosis, lumbar region: Secondary | ICD-10-CM | POA: Diagnosis not present

## 2015-10-11 DIAGNOSIS — M5441 Lumbago with sciatica, right side: Secondary | ICD-10-CM | POA: Diagnosis not present

## 2015-10-16 DIAGNOSIS — M9905 Segmental and somatic dysfunction of pelvic region: Secondary | ICD-10-CM | POA: Diagnosis not present

## 2015-10-16 DIAGNOSIS — M4806 Spinal stenosis, lumbar region: Secondary | ICD-10-CM | POA: Diagnosis not present

## 2015-10-16 DIAGNOSIS — M5441 Lumbago with sciatica, right side: Secondary | ICD-10-CM | POA: Diagnosis not present

## 2015-10-16 DIAGNOSIS — M9903 Segmental and somatic dysfunction of lumbar region: Secondary | ICD-10-CM | POA: Diagnosis not present

## 2015-10-18 DIAGNOSIS — M4806 Spinal stenosis, lumbar region: Secondary | ICD-10-CM | POA: Diagnosis not present

## 2015-10-18 DIAGNOSIS — M5441 Lumbago with sciatica, right side: Secondary | ICD-10-CM | POA: Diagnosis not present

## 2015-10-18 DIAGNOSIS — M9905 Segmental and somatic dysfunction of pelvic region: Secondary | ICD-10-CM | POA: Diagnosis not present

## 2015-10-18 DIAGNOSIS — M9903 Segmental and somatic dysfunction of lumbar region: Secondary | ICD-10-CM | POA: Diagnosis not present

## 2015-10-23 ENCOUNTER — Encounter: Payer: Self-pay | Admitting: Neurology

## 2015-10-23 ENCOUNTER — Ambulatory Visit (INDEPENDENT_AMBULATORY_CARE_PROVIDER_SITE_OTHER): Payer: Medicare Other | Admitting: Neurology

## 2015-10-23 VITALS — BP 174/81 | HR 67 | Ht 61.5 in | Wt 202.2 lb

## 2015-10-23 DIAGNOSIS — M9903 Segmental and somatic dysfunction of lumbar region: Secondary | ICD-10-CM | POA: Diagnosis not present

## 2015-10-23 DIAGNOSIS — I63411 Cerebral infarction due to embolism of right middle cerebral artery: Secondary | ICD-10-CM

## 2015-10-23 DIAGNOSIS — M9905 Segmental and somatic dysfunction of pelvic region: Secondary | ICD-10-CM | POA: Diagnosis not present

## 2015-10-23 DIAGNOSIS — M4806 Spinal stenosis, lumbar region: Secondary | ICD-10-CM | POA: Diagnosis not present

## 2015-10-23 DIAGNOSIS — M5441 Lumbago with sciatica, right side: Secondary | ICD-10-CM | POA: Diagnosis not present

## 2015-10-23 NOTE — Patient Instructions (Signed)
Remember to drink plenty of fluid, eat healthy meals and do not skip any meals. Try to eat protein with a every meal and eat a healthy snack such as fruit or nuts in between meals. Try to keep a regular sleep-wake schedule and try to exercise daily, particularly in the form of walking, 20-30 minutes a day, if you can.   As far as your medications are concerned, I would like to suggest: continue Plavix  As far as diagnostic testing: 30-day heart monitor and another echocardiogram  I would like to see you back in 3 months, sooner if we need to. Please call us with any interim questions, concerns, problems, updates or refill requests.   Our phone number is [phone]. We also have an after hours call service for urgent matters and there is a physician on-call for urgent questions. For any emergencies you know to call 911 or go to the nearest emergency room

## 2015-10-23 NOTE — Progress Notes (Signed)
[facility]    Provider:  Dr Jaynee Eagles Referring Provider: Sinda Du, MD Primary Care Physician:  Alonza Bogus, MD  CC:  Stroke  HPI:  Stacy [name] is a 60 y.o. female here as a referral from Dr. Luan Pulling for stroke.PMHx HTN, DM uncontrolled, HLD, anxiety and depression, CKD, medication non-compliance. In July she woke up with the left hand feeling numb. No inciting events. She was not on anti-platelet drugs prior to event. She did not go to the emergency room. She went to her see her doctor and she had tests done. 3 different tests. She was sent for testing. She is on Plavix now. She was evaluated by a vascular surgeon who did not recommend surgery on the carotid artery. She is not on Aspirin, just on plavix. She had a HGBa1c which showed 9.5 and she is going to Dr. Dorris Fetch for management of glucose. Her LDL was 135 and she is now on Simvastatin. She has resultant weakness and sensory changes on her left hand that are continuous, nothing makes it better or worse just continuous. She went to hand rehab. Her blood pressure is high and she didn't take her amlodipine today so her BP is high but when she takes it her BP is <140/80, she says she has white coat syndrome.  No other focal neurologic deficits.   Reviewed notes, labs and imaging from outside physicians, which showed:  Personally reviewed dimages and agree with following: Numerous punctate infarctions in the right cerebral hemisphere consistent with micro embolic infarctions in the right MCA territory. No large infarction, hemorrhage or mass effect.  Carotid ultrasound:  IMPRESSION: Right:  Although measured velocities on the right suggest no significant stenosis by duplex criteria, the combination of waveforms in the right common carotid artery and internal carotid artery suggest a severe/ high-grade stenosis of the bifurcation/ICA, and further evaluation with a dedicated cerebral angiogram is recommended, or a  second best test would be CTA. Left: Heterogeneous plaque at the left carotid bifurcation contributes to no significant stenosis by established duplex criteria.  REPEAT CAROTID DOPPLERS: Per Dr. Kellie Simmering, "The study done in our lab has no elevated velocities in the right carotid system and has some mild plaque formation but no ulceration noted. There is no evidence of any significant ICA stenosis in either carotid artery."  Echo: Study Conclusions  - Left ventricle: The cavity size was normal. Wall thickness was increased in a pattern of mild LVH. Systolic function was vigorous. The estimated ejection fraction was in the range of 65% to 70%. Wall motion was normal; there were no regional wall motion abnormalities. Features are consistent with a pseudonormal left ventricular filling pattern, with concomitant abnormal relaxation and increased filling pressure (grade 2 diastolic dysfunction). - Aortic valve: Mildly calcified annulus. Trileaflet. - Mitral valve: Calcified annulus. There was trivial regurgitation. - Right atrium: Central venous pressure (est): 3 mm Hg. - Atrial septum: No defect or patent foramen ovale was identified. - Tricuspid valve: There was trivial regurgitation. - Pulmonary arteries: Systolic pressure could not be accurately estimated. - Pericardium, extracardiac: There was no pericardial effusion.  Impressions:  - Mild LVH with LVEF 65-70% and grade 2 diastolic dysfunction.Mildly calcified mitral and aortic annulus. Trivial mitral and tricuspid regurgitation. No obvious PFO or ASD.  09/24/15: HgbA1c 9.5 (hgba1c 10.8 in 2013) ldl 123 in 2012  Review of Systems: Patient complains of symptoms per HPI as well as the following symptoms: swelling in legs, cramps, weakness, not enough sleep. Pertinent negatives per  HPI. All others negative.   Social History   Social History  . Marital Status: Divorced    Spouse Name: N/A  . Number of Children: N/A  .  Years of Education: N/A   Occupational History  .  [employer]    out due to back injury   Social History Main Topics  . Smoking status: Never Smoker   . Smokeless tobacco: Never Used  . Alcohol Use: No  . Drug Use: No  . Sexual Activity: No   Other Topics Concern  . Not on file   Social History Narrative    Family History  Problem Relation Age of Onset  . Liver disease Neg Hx   . Colon cancer Neg Hx   . GI problems Neg Hx   . Stroke Neg Hx   . Diabetes Mother   . Heart disease Mother   . Hyperlipidemia Mother   . Hypertension Mother   . Heart attack Mother   . Diabetes Father   . Heart disease Father   . Hyperlipidemia Father   . Hypertension Father   . Heart attack Father   . Heart disease Brother     Past Medical History  Diagnosis Date  . HTN (hypertension)   . Hyperlipidemia   . Diabetes mellitus   . Osteoarthritis   . Chronic back pain   . Stroke [facility])     Past Surgical History  Procedure Laterality Date  . Carpel tunnel release      bilateral  . Cesarean section    . Colonoscopy  2006    normal, Dr. Benson Norway  . Esophagogastroduodenoscopy  2006    normal, Dr. Benson Norway    Current Outpatient Prescriptions  Medication Sig Dispense Refill  . amLODipine (NORVASC) 5 MG tablet Take 5 mg by mouth daily.    Marland Kitchen atorvastatin (LIPITOR) 80 MG tablet Take 80 mg by mouth daily.    . B-D UF III MINI PEN NEEDLES 31G X 5 MM MISC AS DIRECTED DX:250.0 100 each 10  . baclofen (LIORESAL) 10 MG tablet TK 1 T PO BID PRN  0  . clopidogrel (PLAVIX) 75 MG tablet Take 75 mg by mouth daily.    Marland Kitchen FREESTYLE TEST STRIPS test strip CHECK CBG THREE TIMES DAILY 100 each 8  . insulin aspart (NOVOLOG FLEXPEN) 100 UNIT/ML FlexPen Inject 10-16 Units into the skin 3 (three) times daily with meals. 15 mL 2  . Insulin Detemir (LEVEMIR FLEXTOUCH) 100 UNIT/ML Pen Inject 60 Units into the skin at bedtime. 15 mL 2  . losartan-hydrochlorothiazide (HYZAAR) 50-12.5 MG tablet Take 1 tablet by  mouth daily. 30 tablet 2   No current facility-administered medications for this visit.    Allergies as of 10/23/2015  . (No Known Allergies)    Vitals: BP 174/81 mmHg  Pulse 67  Ht 5' 1.5" (1.562 m)  Wt 202 lb 3.2 oz (91.717 kg)  BMI 37.59 kg/m2 Last Weight:  Wt Readings from Last 1 Encounters:  10/23/15 202 lb 3.2 oz (91.717 kg)   Last Height:   Ht Readings from Last 1 Encounters:  10/23/15 5' 1.5" (1.562 m)   Physical exam: Exam: Gen: NAD, conversant, well nourised, obese, well groomed                     CV: RRR, no MRG. No Carotid Bruits. No peripheral edema, warm, nontender Eyes: Conjunctivae clear without exudates or hemorrhage  Neuro: Detailed Neurologic Exam  Speech:  Speech is normal; fluent and spontaneous with normal comprehension.  Cognition:    The patient is oriented to person, place, and time;     recent and remote memory intact;     language fluent;     normal attention, concentration,     fund of knowledge Cranial Nerves:    The pupils are equal, round, and reactive to light. The fundi are flat. Visual fields are full to finger confrontation. Extraocular movements are intact. Trigeminal sensation is intact and the muscles of mastication are normal. The face is symmetric. The palate elevates in the midline. Hearing intact. Voice is normal. Shoulder shrug is normal. The tongue has normal motion without fasciculations.   Coordination:    Normal finger to nose and heel to shin. Dec. Fine motor with left hand.  Gait:    Heel-toe and tandem gait are normal.   Motor Observation:    No asymmetry, no atrophy, and no involuntary movements noted. Tone:    Normal muscle tone.    Posture:    Posture is normal. normal erect    Strength:    Strength is V/V in the upper and lower limbs.      Sensation: decreased sensation left hand     Reflex Exam:  DTR's:    Deep tendon reflexes in the upper and lower extremities are symmetrical bilaterally.     Toes:    The toes are downgoing bilaterally.   Clonus:    Clonus is absent.       Assessment/Plan:   60 y.o. female here as a referral from Dr. Luan Pulling for stroke.PMHx HTN, DM uncontrolled, HLD, anxiety and depression, CKD, noncompliance. MRI showed numerous punctate infarctions in the right cerebral hemisphere consistent with micro embolic infarctions in the right MCA territory. No large infarction, hemorrhage or mass effect. Need to further evaluate for causes of embolic infarct.  - Needs TEE and 30-day heart monitor for evaluation of afib and embolic source. If heart monitor negative recommend loop recorder.  - Carotid artery stenosis evaluated and no hemodynamically significant stenosis found (repeat carotid ultrasound and evaluation from Vascular Surgery) - she is non compliant with uncontrolled diabetes, hyperlipidemia and HTN. I had a long d/w patient about her recent stroke, risk for recurrent stroke/TIAs, personally independently reviewed imaging studies and stroke evaluation results and answered questions.Continue Plavix for secondary stroke prevention and maintain strict control of hypertension with blood pressure goal below 130/90, diabetes with hemoglobin A1c goal below 6.5% and lipids with LDL cholesterol goal below 70 mg/dL.Recommend she see her primary MD for management of these vascular risk factors. I also advised the patient to eat a healthy diet with plenty of whole grains, cereals, fruits and vegetables, exercise regularly and maintain ideal body weight .Followup in the future with me in 2 months or call earlier if necessary.  Stacy Ill, MD  [facility] [address] [place], [place] [address]  Phone [phone] Fax [phone]

## 2015-10-26 ENCOUNTER — Telehealth: Payer: Self-pay | Admitting: Neurology

## 2015-10-26 ENCOUNTER — Encounter: Payer: Self-pay | Admitting: Neurology

## 2015-10-26 DIAGNOSIS — I639 Cerebral infarction, unspecified: Secondary | ICD-10-CM | POA: Insufficient documentation

## 2015-10-26 DIAGNOSIS — I4891 Unspecified atrial fibrillation: Secondary | ICD-10-CM

## 2015-10-26 NOTE — Telephone Encounter (Signed)
Emma, I need to order a 30-day holter (heart) monitor for patient. Can you find out how I order this? I only see 72 hour one in EPIC. thanks

## 2015-10-28 NOTE — Telephone Encounter (Signed)
Placed order as requested.

## 2015-10-28 NOTE — Addendum Note (Signed)
Addended by: Hope Pigeon on: 10/28/2015 09:08 AM   Modules accepted: Orders

## 2015-10-29 ENCOUNTER — Telehealth: Payer: Self-pay | Admitting: *Deleted

## 2015-10-29 DIAGNOSIS — M9903 Segmental and somatic dysfunction of lumbar region: Secondary | ICD-10-CM | POA: Diagnosis not present

## 2015-10-29 DIAGNOSIS — M9905 Segmental and somatic dysfunction of pelvic region: Secondary | ICD-10-CM | POA: Diagnosis not present

## 2015-10-29 DIAGNOSIS — M4806 Spinal stenosis, lumbar region: Secondary | ICD-10-CM | POA: Diagnosis not present

## 2015-10-29 DIAGNOSIS — M5441 Lumbago with sciatica, right side: Secondary | ICD-10-CM | POA: Diagnosis not present

## 2015-10-29 NOTE — Telephone Encounter (Signed)
Tried home number. Kept ringing and did not go to VM. Tried mobile number. Spoke to pt. Advised she has appt scheduled 11/04/15 at 10am at [facility] in [place]. Gave office number: [phone]. She can call and r/s if appt does not work for her. She verbalized understanding. Gave address to office as well.

## 2015-11-01 ENCOUNTER — Other Ambulatory Visit: Payer: Self-pay | Admitting: Neurology

## 2015-11-01 DIAGNOSIS — I639 Cerebral infarction, unspecified: Secondary | ICD-10-CM

## 2015-11-01 DIAGNOSIS — I4891 Unspecified atrial fibrillation: Secondary | ICD-10-CM

## 2015-11-04 ENCOUNTER — Ambulatory Visit (INDEPENDENT_AMBULATORY_CARE_PROVIDER_SITE_OTHER): Payer: Medicare Other

## 2015-11-04 DIAGNOSIS — I639 Cerebral infarction, unspecified: Secondary | ICD-10-CM

## 2015-11-04 DIAGNOSIS — I4891 Unspecified atrial fibrillation: Secondary | ICD-10-CM

## 2015-11-06 DIAGNOSIS — M5441 Lumbago with sciatica, right side: Secondary | ICD-10-CM | POA: Diagnosis not present

## 2015-11-06 DIAGNOSIS — M4806 Spinal stenosis, lumbar region: Secondary | ICD-10-CM | POA: Diagnosis not present

## 2015-11-06 DIAGNOSIS — M9905 Segmental and somatic dysfunction of pelvic region: Secondary | ICD-10-CM | POA: Diagnosis not present

## 2015-11-06 DIAGNOSIS — M9903 Segmental and somatic dysfunction of lumbar region: Secondary | ICD-10-CM | POA: Diagnosis not present

## 2015-11-11 ENCOUNTER — Encounter: Payer: Medicare Other | Attending: Orthopedic Surgery | Admitting: Nutrition

## 2015-11-11 ENCOUNTER — Encounter: Payer: Self-pay | Admitting: Nutrition

## 2015-11-11 VITALS — Ht 61.0 in | Wt 199.0 lb

## 2015-11-11 DIAGNOSIS — E669 Obesity, unspecified: Secondary | ICD-10-CM

## 2015-11-11 DIAGNOSIS — E1165 Type 2 diabetes mellitus with hyperglycemia: Secondary | ICD-10-CM | POA: Insufficient documentation

## 2015-11-11 DIAGNOSIS — Z794 Long term (current) use of insulin: Secondary | ICD-10-CM | POA: Insufficient documentation

## 2015-11-11 DIAGNOSIS — E1159 Type 2 diabetes mellitus with other circulatory complications: Secondary | ICD-10-CM | POA: Diagnosis not present

## 2015-11-11 DIAGNOSIS — E118 Type 2 diabetes mellitus with unspecified complications: Secondary | ICD-10-CM

## 2015-11-11 DIAGNOSIS — IMO0002 Reserved for concepts with insufficient information to code with codable children: Secondary | ICD-10-CM

## 2015-11-11 NOTE — Progress Notes (Signed)
  Medical Nutrition Therapy:  Appt start time: 1100 end time:  1200.  Assessment:  Primary concerns today: Diabetes. LIves by [employer].  Eats most meals at home and foods are baked. Testing blood sugars 4 times. FBS: 191 mg.dl this am Last night before bed 114 mg/dl. Levemir 60 mg/dl daily and 10 units of Novolog witth meals plus sliding scale. She notes she tends to inject insulin in same area of stomach. Has developed 'knots' under her skin. Has been avoiding carbs trying to get BS down but she notes they are going higher instead of lower. Has history of back problems. Had had to get injections in her back to help with pain. BS increase as a result.  BS not well controlled. Walks some and does Tia Chi exercises. Trying to be more active to improve BS and her weight. Diet is insuffient in CHO, fresh fruits and low carb vegetables. Compliant with medications.   Lab Results  Component Value Date   HGBA1C 9.5* 09/17/2015   Preferred Learning Style:  Aud  No preference indicated   Learning Readiness:   Ready  Change in progress  MEDICATIONS: See list   DIETARY INTAKE:  24-hr recall:  B ( AM): Cup of hot tea and a Belvita cracker.OR omelet veggie, 1 c milk 2%  Snk ( AM): none L ( PM): [place] 2 burgers, water Snk ( PM): D ( PM): Steak, asparagus, grilled onions, and water Snk ( PM): Beverages: Water, soda occasionally, 50/50 tea   Usual physical activity: ADL,Tia Chi; walking 30 minutes.  Estimated energy needs: 1500 calories 170 g carbohydrates 112 g protein 42 g fat  Progress Towards Goal(s):  In progress.   Nutritional Diagnosis:  NB-1.1 Food and nutrition-related knowledge deficit As related to Dm.  As evidenced by A1C 9.8%.    Intervention:  Nutrition and Diabetes education provided on My Plate, CHO counting, meal planning, portion sizes, timing of meals, avoiding snacks between meals unless having a low blood sugar, target ranges for A1C and blood sugars,  signs/symptoms and treatment of hyper/hypoglycemia, monitoring blood sugars, taking medications as prescribed, benefits of exercising 30 minutes per day and prevention of complications of DM.  Goals 1. Follow Plate Method 2. Lose 1 lb per week.  3. Cut out sodas, juices, 4. Eat 2-3 carb choices. 5. Increase fresh fruit and vegetables. 6. Get AC to 7% in three months. 7. Rotate site in stomach area for injecting insulin. 8. Count to 10 before pulling insulin pen out of skin.  Teaching Method Utilized:  Visual Auditory Hands on  Handouts given during visit include:  The Plate Method  Meal Plan Card  Diabetes Instructions.   Barriers to learning/adherence to lifestyle change: back problems  Demonstrated degree of understanding via:  Teach Back   Monitoring/Evaluation:  Dietary intake, exercise, meal planning, SBG, and body weight in 1 month(s).

## 2015-11-11 NOTE — Patient Instructions (Signed)
Goals 1. Follow Plate Method 2. Lose 1 lb per week.  3. Cut out sodas, juices, 4. Eat 2-3 carb choices. 5. Increase fresh fruit and vegetables. 6. Get AC to 7% in three months.

## 2015-11-20 DIAGNOSIS — M5441 Lumbago with sciatica, right side: Secondary | ICD-10-CM | POA: Diagnosis not present

## 2015-11-20 DIAGNOSIS — M9903 Segmental and somatic dysfunction of lumbar region: Secondary | ICD-10-CM | POA: Diagnosis not present

## 2015-11-20 DIAGNOSIS — M9905 Segmental and somatic dysfunction of pelvic region: Secondary | ICD-10-CM | POA: Diagnosis not present

## 2015-11-20 DIAGNOSIS — M4806 Spinal stenosis, lumbar region: Secondary | ICD-10-CM | POA: Diagnosis not present

## 2015-12-04 DIAGNOSIS — M4806 Spinal stenosis, lumbar region: Secondary | ICD-10-CM | POA: Diagnosis not present

## 2015-12-04 DIAGNOSIS — M9905 Segmental and somatic dysfunction of pelvic region: Secondary | ICD-10-CM | POA: Diagnosis not present

## 2015-12-04 DIAGNOSIS — M5441 Lumbago with sciatica, right side: Secondary | ICD-10-CM | POA: Diagnosis not present

## 2015-12-04 DIAGNOSIS — M9903 Segmental and somatic dysfunction of lumbar region: Secondary | ICD-10-CM | POA: Diagnosis not present

## 2015-12-24 ENCOUNTER — Ambulatory Visit: Payer: Medicare Other | Admitting: "Endocrinology

## 2015-12-25 ENCOUNTER — Encounter: Payer: Self-pay | Admitting: Family

## 2015-12-26 ENCOUNTER — Ambulatory Visit: Payer: Medicare Other | Admitting: "Endocrinology

## 2015-12-30 ENCOUNTER — Ambulatory Visit: Payer: Self-pay | Admitting: Family

## 2015-12-31 DIAGNOSIS — I10 Essential (primary) hypertension: Secondary | ICD-10-CM | POA: Diagnosis not present

## 2015-12-31 DIAGNOSIS — E119 Type 2 diabetes mellitus without complications: Secondary | ICD-10-CM | POA: Diagnosis not present

## 2015-12-31 DIAGNOSIS — M545 Low back pain: Secondary | ICD-10-CM | POA: Diagnosis not present

## 2015-12-31 DIAGNOSIS — I639 Cerebral infarction, unspecified: Secondary | ICD-10-CM | POA: Diagnosis not present

## 2016-01-05 ENCOUNTER — Telehealth: Payer: Self-pay | Admitting: Neurology

## 2016-01-05 NOTE — Telephone Encounter (Signed)
Stacy [name], patient's 30 day Holter did not show any heart abnormalities. But given her embolic stroke on MRI I do recommend a referral to Cardiology to discuss loop recorder implant. Please talk to patient and let me know, this is something I addressed at her appointment. I can order the consult, let me know thank you. I am happy to discuss with patient over the phone or in the office if she likes as well but we did discuss it at appointment in [place]. thanks

## 2016-01-06 NOTE — Telephone Encounter (Signed)
Tried calling pt on home number. Phone continued to ring. Could not LVM. Will try again later.

## 2016-01-06 NOTE — Telephone Encounter (Signed)
Dr Jaynee Eagles- FYI Called pt on home number again. Continued to ring. Unable to LVM. Tried mobile number. Spoke to pt. Relayed Dr Jaynee Eagles message below. She wants to hold off on referral until see comes to see Dr Jaynee Eagles on 4/18 at 12pm, check in 1145am. She will talk to Dr Jaynee Eagles about this at that time.

## 2016-01-06 NOTE — Telephone Encounter (Signed)
Thanks

## 2016-01-07 DIAGNOSIS — M9905 Segmental and somatic dysfunction of pelvic region: Secondary | ICD-10-CM | POA: Diagnosis not present

## 2016-01-07 DIAGNOSIS — M9903 Segmental and somatic dysfunction of lumbar region: Secondary | ICD-10-CM | POA: Diagnosis not present

## 2016-01-07 DIAGNOSIS — M5441 Lumbago with sciatica, right side: Secondary | ICD-10-CM | POA: Diagnosis not present

## 2016-01-07 DIAGNOSIS — M4806 Spinal stenosis, lumbar region: Secondary | ICD-10-CM | POA: Diagnosis not present

## 2016-01-28 ENCOUNTER — Ambulatory Visit (INDEPENDENT_AMBULATORY_CARE_PROVIDER_SITE_OTHER): Payer: Medicare Other | Admitting: Neurology

## 2016-01-28 VITALS — BP 150/60 | HR 72 | Ht 61.0 in | Wt 193.6 lb

## 2016-01-28 DIAGNOSIS — I639 Cerebral infarction, unspecified: Secondary | ICD-10-CM | POA: Diagnosis not present

## 2016-01-28 DIAGNOSIS — I669 Occlusion and stenosis of unspecified cerebral artery: Secondary | ICD-10-CM

## 2016-01-28 NOTE — Progress Notes (Signed)
[facility]    Provider:  Dr Jaynee Eagles Referring Provider: Sinda Du, MD Primary Care Physician:  Alonza Bogus, MD   CC: Stroke  Interval history 01/28/2016: Stacy [name] is a 60 y.o. female here as a referral from Dr. Luan Pulling for stroke.PMHx HTN, DM uncontrolled, HLD, anxiety and depression, CKD, medication non-compliance. In July she woke up with the left hand feeling numb. No inciting events. She was not on anti-platelet drugs prior to event now on Plavix. MRi of the brain showed possible embolic events.  Echo, carotids showed no etiology. TEE and loop recommended. Last hgba1c 9.5 (10.8 and 11.8 previously). LDL 135 now on lipitor. Has recently followed for her diabetes. Discussed the next steps which include a TEE and loop but patient declines, she understands increased risk of strokes with atrial fibrillation. She feels her symptoms have resolved. Discussed diastolic dysfunction and that she needs to follow up with primary care.   Cardiac event monitor: Sinus rhythm No atrial fibrillation No sustained arrhythmias Lead artifact limits interpretation  Echo: Impressions:  - Mild LVH with LVEF 65-70% and grade 2 diastolic dysfunction.  Mildly calcified mitral and aortic annulus. Trivial mitral and  tricuspid regurgitation. No obvious PFO or ASD.  REPEAT CAROTID DOPPLERS: Per Dr. Kellie Simmering, "The study done in our lab has no elevated velocities in the right carotid system and has some mild plaque formation but no ulceration noted. There is no evidence of any significant ICA stenosis in either carotid artery."   HPI: Stacy [name] is a 60 y.o. female here as a referral from Dr. Luan Pulling for stroke.PMHx HTN, DM uncontrolled, HLD, anxiety and depression, CKD, medication non-compliance. In July she woke up with the left hand feeling numb. No inciting events. She was not on anti-platelet drugs prior to event. She did not go to the emergency room. She went to her  see her doctor and she had tests done. 3 different tests. She was sent for testing. She is on Plavix now. She was evaluated by a vascular surgeon who did not recommend surgery on the carotid artery. She is not on Aspirin, just on plavix. She had a HGBa1c which showed 9.5 and she is going to Dr. Dorris Fetch for management of glucose. Her LDL was 135 and she is now on Simvastatin. She has resultant weakness and sensory changes on her left hand that are continuous, nothing makes it better or worse just continuous. She went to hand rehab. Her blood pressure is high and she didn't take her amlodipine today so her BP is high but when she takes it her BP is <140/80, she says she has white coat syndrome. No other focal neurologic deficits.   Reviewed notes, labs and imaging from outside physicians, which showed:  Personally reviewed dimages and agree with following: Numerous punctate infarctions in the right cerebral hemisphere consistent with micro embolic infarctions in the right MCA territory. No large infarction, hemorrhage or mass effect.  Carotid ultrasound:  IMPRESSION: Right:  Although measured velocities on the right suggest no significant stenosis by duplex criteria, the combination of waveforms in the right common carotid artery and internal carotid artery suggest a severe/ high-grade stenosis of the bifurcation/ICA, and further evaluation with a dedicated cerebral angiogram is recommended, or a second best test would be CTA. Left: Heterogeneous plaque at the left carotid bifurcation contributes to no significant stenosis by established duplex criteria.  REPEAT CAROTID DOPPLERS: Per Dr. Kellie Simmering, "The study done in our lab has no elevated velocities in the  right carotid system and has some mild plaque formation but no ulceration noted. There is no evidence of any significant ICA stenosis in either carotid artery."  Echo: Study Conclusions  - Left ventricle: The cavity size was normal. Wall  thickness was increased in a pattern of mild LVH. Systolic function was vigorous. The estimated ejection fraction was in the range of 65% to 70%. Wall motion was normal; there were no regional wall motion abnormalities. Features are consistent with a pseudonormal left ventricular filling pattern, with concomitant abnormal relaxation and increased filling pressure (grade 2 diastolic dysfunction). - Aortic valve: Mildly calcified annulus. Trileaflet. - Mitral valve: Calcified annulus. There was trivial regurgitation. - Right atrium: Central venous pressure (est): 3 mm Hg. - Atrial septum: No defect or patent foramen ovale was identified. - Tricuspid valve: There was trivial regurgitation. - Pulmonary arteries: Systolic pressure could not be accurately estimated. - Pericardium, extracardiac: There was no pericardial effusion.  Impressions:  - Mild LVH with LVEF 65-70% and grade 2 diastolic dysfunction.Mildly calcified mitral and aortic annulus. Trivial mitral and tricuspid regurgitation. No obvious PFO or ASD.  09/24/15: HgbA1c 9.5 (hgba1c 10.8 in 2013) ldl 123 in 2012  Review of Systems: Patient complains of symptoms per HPI as well as the following symptoms: swelling in legs, cramps, weakness, not enough sleep. Pertinent negatives per HPI. All others negative.   Social History   Social History  . Marital Status: Divorced    Spouse Name: N/A  . Number of Children: N/A  . Years of Education: N/A   Occupational History  .  [employer]    out due to back injury   Social History Main Topics  . Smoking status: Never Smoker   . Smokeless tobacco: Never Used  . Alcohol Use: No  . Drug Use: No  . Sexual Activity: No   Other Topics Concern  . Not on file   Social History Narrative    Family History  Problem Relation Age of Onset  . Liver disease Neg Hx   . Colon cancer Neg Hx   . GI problems Neg Hx   . Stroke Neg Hx   . Diabetes Mother   . Heart disease  Mother   . Hyperlipidemia Mother   . Hypertension Mother   . Heart attack Mother   . Diabetes Father   . Heart disease Father   . Hyperlipidemia Father   . Hypertension Father   . Heart attack Father   . Heart disease Brother     Past Medical History  Diagnosis Date  . HTN (hypertension)   . Hyperlipidemia   . Diabetes mellitus   . Osteoarthritis   . Chronic back pain   . Stroke [facility])     Past Surgical History  Procedure Laterality Date  . Carpel tunnel release      bilateral  . Cesarean section    . Colonoscopy  2006    normal, Dr. Benson Norway  . Esophagogastroduodenoscopy  2006    normal, Dr. Benson Norway    Current Outpatient Prescriptions  Medication Sig Dispense Refill  . amLODipine (NORVASC) 5 MG tablet Take 5 mg by mouth daily.    Marland Kitchen atorvastatin (LIPITOR) 80 MG tablet Take 80 mg by mouth daily.    . B-D UF III MINI PEN NEEDLES 31G X 5 MM MISC AS DIRECTED DX:250.0 100 each 10  . baclofen (LIORESAL) 10 MG tablet TK 1 T PO BID PRN  0  . clopidogrel (PLAVIX) 75 MG tablet Take  75 mg by mouth daily.    Marland Kitchen FREESTYLE TEST STRIPS test strip CHECK CBG THREE TIMES DAILY 100 each 8  . insulin aspart (NOVOLOG FLEXPEN) 100 UNIT/ML FlexPen Inject 10-16 Units into the skin 3 (three) times daily with meals. 15 mL 2  . Insulin Detemir (LEVEMIR FLEXTOUCH) 100 UNIT/ML Pen Inject 60 Units into the skin at bedtime. 15 mL 2  . losartan-hydrochlorothiazide (HYZAAR) 50-12.5 MG tablet Take 1 tablet by mouth daily. 30 tablet 2   No current facility-administered medications for this visit.    Allergies as of 01/28/2016  . (No Known Allergies)    Vitals: There were no vitals taken for this visit. Last Weight:  Wt Readings from Last 1 Encounters:  11/11/15 199 lb (90.266 kg)   Last Height:   Ht Readings from Last 1 Encounters:  11/11/15 5\' 1"  (1.549 m)    Neuro: Detailed Neurologic Exam  Speech:  Speech is normal; fluent and spontaneous with normal comprehension.  Cognition:  The  patient is oriented to person, place, and time;   recent and remote memory intact;   language fluent;   normal attention, concentration,   fund of knowledge Cranial Nerves:  The pupils are equal, round, and reactive to light. The fundi are flat. Visual fields are full to finger confrontation. Extraocular movements are intact. Trigeminal sensation is intact and the muscles of mastication are normal. The face is symmetric. The palate elevates in the midline. Hearing intact. Voice is normal. Shoulder shrug is normal. The tongue has normal motion without fasciculations.   Coordination:  Normal finger to nose and heel to shin. Dec. Fine motor with left hand.  Gait:  Heel-toe and tandem gait are normal.   Motor Observation:  No asymmetry, no atrophy, and no involuntary movements noted. Tone:  Normal muscle tone.   Posture:  Posture is normal. normal erect   Strength:  Strength is V/V in the upper and lower limbs.    Sensation: decreased sensation left hand   Reflex Exam:  DTR's:  Deep tendon reflexes in the upper and lower extremities are symmetrical bilaterally.  Toes:  The toes are downgoing bilaterally.  Clonus:  Clonus is absent.      Assessment/Plan: 60 y.o. female here as a referral from Dr. Luan Pulling for stroke.PMHx HTN, DM uncontrolled, HLD, anxiety and depression, CKD, noncompliance. MRI showed numerous punctate infarctions in the right cerebral hemisphere consistent with micro embolic infarctions in the right MCA territory. No large infarction, hemorrhage or mass effect. Need to further evaluate for causes of embolic infarct.  - Needs TEE and 30-day heart monitor for evaluation of afib and embolic source. If heart monitor negative recommend loop recorder. Echocardiogram and heart monitor did not show any atrial fibrillation or etiology of embolic strokes. Patient declines TEE and loop recorder or further workup and  understands the risks and that atrial fibrillation infers higher risk of stroke. She will continue the Plavix and statin. - Carotid artery stenosis evaluated and no hemodynamically significant stenosis found (repeat carotid ultrasound and evaluation from Vascular Surgery) - she is non compliant with uncontrolled diabetes, hyperlipidemia and HTN. I had a long d/w patient about her recent stroke, risk for recurrent stroke/TIAs, personally independently reviewed imaging studies and stroke evaluation results and answered questions.Continue Plavix for secondary stroke prevention and maintain strict control of hypertension with blood pressure goal below 130/90, diabetes with hemoglobin A1c goal below 6.5% and lipids with LDL cholesterol goal below 70 mg/dL.Recommend she see her primary MD for management  of these vascular risk factors. I also advised the patient to eat a healthy diet with plenty of whole grains, cereals, fruits and vegetables, exercise regularly and maintain ideal body weight . -she has had occupational therapy on the left hand.   Sarina Ill, MD  [facility] [address] [place], [place] [address]  Phone [phone] Fax [phone]  A total of 30 minutes was spent face-to-face with this patient. Over half this time was spent on counseling patient on the embolic stroke diagnosis and different diagnostic and therapeutic options available.

## 2016-01-28 NOTE — Patient Instructions (Signed)
Remember to drink plenty of fluid, eat healthy meals and do not skip any meals. Try to eat protein with a every meal and eat a healthy snack such as fruit or nuts in between meals. Try to keep a regular sleep-wake schedule and try to exercise daily, particularly in the form of walking, 20-30 minutes a day, if you can.   As far as your medications are concerned, I would like to suggest; continue current medications  Our phone number is [phone]. We also have an after hours call service for urgent matters and there is a physician on-call for urgent questions. For any emergencies you know to call 911 or go to the nearest emergency room

## 2016-02-07 ENCOUNTER — Encounter: Payer: Self-pay | Admitting: Family

## 2016-02-13 ENCOUNTER — Other Ambulatory Visit: Payer: Self-pay | Admitting: *Deleted

## 2016-02-13 DIAGNOSIS — I6523 Occlusion and stenosis of bilateral carotid arteries: Secondary | ICD-10-CM

## 2016-02-17 ENCOUNTER — Ambulatory Visit (INDEPENDENT_AMBULATORY_CARE_PROVIDER_SITE_OTHER): Payer: Medicare Other | Admitting: Family

## 2016-02-17 ENCOUNTER — Encounter: Payer: Self-pay | Admitting: Family

## 2016-02-17 ENCOUNTER — Ambulatory Visit (HOSPITAL_COMMUNITY)
Admission: RE | Admit: 2016-02-17 | Discharge: 2016-02-17 | Disposition: A | Discharge status: Home / Self Care | Payer: Medicare Other | Source: Ambulatory Visit | Attending: Family | Admitting: Family

## 2016-02-17 VITALS — BP 167/81 | HR 65 | Temp 98.0°F

## 2016-02-17 DIAGNOSIS — I10 Essential (primary) hypertension: Secondary | ICD-10-CM | POA: Diagnosis not present

## 2016-02-17 DIAGNOSIS — E119 Type 2 diabetes mellitus without complications: Secondary | ICD-10-CM | POA: Diagnosis not present

## 2016-02-17 DIAGNOSIS — Z8673 Personal history of transient ischemic attack (TIA), and cerebral infarction without residual deficits: Secondary | ICD-10-CM

## 2016-02-17 DIAGNOSIS — E785 Hyperlipidemia, unspecified: Secondary | ICD-10-CM | POA: Diagnosis not present

## 2016-02-17 DIAGNOSIS — I6523 Occlusion and stenosis of bilateral carotid arteries: Secondary | ICD-10-CM | POA: Diagnosis not present

## 2016-02-17 NOTE — Patient Instructions (Signed)
Stroke Prevention Some medical conditions and behaviors are associated with an increased chance of having a stroke. You may prevent a stroke by making healthy choices and managing medical conditions. HOW CAN I REDUCE MY RISK OF HAVING A STROKE?   Stay physically active. Get at least 30 minutes of activity on most or all days.  Do not smoke. It may also be helpful to avoid exposure to secondhand smoke.  Limit alcohol use. Moderate alcohol use is considered to be:  No more than 2 drinks per day for men.  No more than 1 drink per day for nonpregnant women.  Eat healthy foods. This involves:  Eating 5 or more servings of fruits and vegetables a day.  Making dietary changes that address high blood pressure (hypertension), high cholesterol, diabetes, or obesity.  Manage your cholesterol levels.  Making food choices that are high in fiber and low in saturated fat, trans fat, and cholesterol may control cholesterol levels.  Take any prescribed medicines to control cholesterol as directed by your health care provider.  Manage your diabetes.  Controlling your carbohydrate and sugar intake is recommended to manage diabetes.  Take any prescribed medicines to control diabetes as directed by your health care provider.  Control your hypertension.  Making food choices that are low in salt (sodium), saturated fat, trans fat, and cholesterol is recommended to manage hypertension.  Ask your health care provider if you need treatment to lower your blood pressure. Take any prescribed medicines to control hypertension as directed by your health care provider.  If you are 18-39 years of age, have your blood pressure checked every 3-5 years. If you are 40 years of age or older, have your blood pressure checked every year.  Maintain a healthy weight.  Reducing calorie intake and making food choices that are low in sodium, saturated fat, trans fat, and cholesterol are recommended to manage  weight.  Stop drug abuse.  Avoid taking birth control pills.  Talk to your health care provider about the risks of taking birth control pills if you are over 35 years old, smoke, get migraines, or have ever had a blood clot.  Get evaluated for sleep disorders (sleep apnea).  Talk to your health care provider about getting a sleep evaluation if you snore a lot or have excessive sleepiness.  Take medicines only as directed by your health care provider.  For some people, aspirin or blood thinners (anticoagulants) are helpful in reducing the risk of forming abnormal blood clots that can lead to stroke. If you have the irregular heart rhythm of atrial fibrillation, you should be on a blood thinner unless there is a good reason you cannot take them.  Understand all your medicine instructions.  Make sure that other conditions (such as anemia or atherosclerosis) are addressed. SEEK IMMEDIATE MEDICAL CARE IF:   You have sudden weakness or numbness of the face, arm, or leg, especially on one side of the body.  Your face or eyelid droops to one side.  You have sudden confusion.  You have trouble speaking (aphasia) or understanding.  You have sudden trouble seeing in one or both eyes.  You have sudden trouble walking.  You have dizziness.  You have a loss of balance or coordination.  You have a sudden, severe headache with no known cause.  You have new chest pain or an irregular heartbeat. Any of these symptoms may represent a serious problem that is an emergency. Do not wait to see if the symptoms will   go away. Get medical help at once. Call your local emergency services (911 in [place] Do not drive yourself to the hospital.   This information is not intended to replace advice given to you by your health care provider. Make sure you discuss any questions you have with your health care provider.   Document Released: 11/05/2004 Document Revised: 10/19/2014 Document Reviewed:  03/31/2013 Elsevier Interactive Patient Education 2016 [employer]  

## 2016-02-17 NOTE — Progress Notes (Signed)
Chief Complaint: Follow up Extracranial Carotid Artery Stenosis   History of Present Illness  Stacy [name] is a 60 y.o. female patient of Dr. Trula Slade who was initially referred by Dr. Velvet Bathe for possible carotid artery occlusive disease.  Patient had spinal injection for chronic back pain in early September 2016. 2 days later she awoke with numbness in her left thumb index finger and middle finger. She has had some mild clumsiness in the left hand. She has no other neurologic symptoms.  MRI was performed on 06/21/15 which revealed multiple areas of embolic-appearing ischemia to the right hemisphere. Carotid duplex exam was performed which suggested a possible right ICA stenosis but this was not definitive and there was no significant elevation and velocities. She was referred here for further evaluation. She has been on Plavix since about the second week of September 2016, but was not on an any anti-platelet drugs prior to this event.  Dr. Kellie Simmering last saw pt on 07/02/15. At that time his assessment was as follows: Recent possible embolic stroke right brain by MRI No evidence of significant carotid occlusive disease in our study today  Type 2 diabetes mellitus Osteoarthritis lumbar spine  Pt denies any known subsequent TIA's or strokes; no subsequent tingling or numbness in left upper extremity.  Pt states she has pain in both hips, right worse than left, after walking varying distances; however, the pain in hips usually improves the more she walks.  Also a change in position helps the pain in her hips. She has known spine issues.  Pt denies non healing wounds.  Pt states she does not check her blood pressure at home.  She wore a cardiac monitor for a month in January 2017, pt states no significant abnormalities found.  The patient denies New Medical or Surgical History.  Pt Diabetic: yes, December 2016 A1C was 9.5 (review of records) Pt smoker: non-smoker  Pt meds  include: Statin : yes ASA: no Other anticoagulants/antiplatelets: Plavix   Past Medical History  Diagnosis Date  . HTN (hypertension)   . Hyperlipidemia   . Diabetes mellitus   . Osteoarthritis   . Chronic back pain   . Stroke [facility])     Social History Social History  Substance Use Topics  . Smoking status: Never Smoker   . Smokeless tobacco: Never Used  . Alcohol Use: No    Family History Family History  Problem Relation Age of Onset  . Liver disease Neg Hx   . Colon cancer Neg Hx   . GI problems Neg Hx   . Stroke Neg Hx   . Diabetes Mother   . Heart disease Mother   . Hyperlipidemia Mother   . Hypertension Mother   . Heart attack Mother   . Diabetes Father   . Heart disease Father   . Hyperlipidemia Father   . Hypertension Father   . Heart attack Father   . Heart disease Brother     Surgical History Past Surgical History  Procedure Laterality Date  . Carpel tunnel release      bilateral  . Cesarean section    . Colonoscopy  2006    normal, Dr. Benson [place]  . Esophagogastroduodenoscopy  2006    normal, Dr. Benson [place]    No Known Allergies  Current Outpatient Prescriptions  Medication Sig Dispense Refill  . amLODipine (NORVASC) 5 MG tablet Take 5 mg by mouth daily.    Marland Kitchen atorvastatin (LIPITOR) 80 MG tablet Take 80 mg by mouth daily.    Marland Kitchen  B-D UF III MINI PEN NEEDLES 31G X 5 MM MISC AS DIRECTED DX:250.0 100 each 10  . baclofen (LIORESAL) 10 MG tablet Reported on 02/17/2016  0  . clopidogrel (PLAVIX) 75 MG tablet Take 75 mg by mouth daily.    Marland Kitchen FREESTYLE TEST STRIPS test strip CHECK CBG THREE TIMES DAILY 100 each 8  . insulin aspart (NOVOLOG FLEXPEN) 100 UNIT/ML FlexPen Inject 10-16 Units into the skin 3 (three) times daily with meals. 15 mL 2  . Insulin Detemir (LEVEMIR FLEXTOUCH) 100 UNIT/ML Pen Inject 60 Units into the skin at bedtime. 15 mL 2  . losartan-hydrochlorothiazide (HYZAAR) 50-12.5 MG tablet Take 1 tablet by mouth daily. 30 tablet 2   No current  facility-administered medications for this visit.    Review of Systems : See HPI for pertinent positives and negatives.  Physical Examination  Filed Vitals:   02/17/16 0910  BP: 167/81  Pulse: 65  Temp: 98 F (36.7 C)  TempSrc: Oral  SpO2: 98%   There is no weight on file to calculate BMI.  Pt states her weight today was 188#  General: WDWN female in NAD GAIT: normal Eyes: PERRLA Pulmonary:  Non-labored, CTAB, no rales, rhonchi, or wheezing.  Cardiac: regular rhythm,  no detected murmur.  VASCULAR EXAM Carotid Bruits Right Left   Negative Negative    Aorta is not palpable. Radial pulses are 1+ palpable right and 2+ palpable left.                                                                                                                            LE Pulses Right Left       POPLITEAL  not palpable   not palpable    Gastrointestinal: soft, nontender, BS WNL, no r/g, no palpable masses.  Musculoskeletal: no muscle atrophy/wasting. M/S 5/5 throughout, extremities without ischemic changes.  Neurologic: A&O X 3; Appropriate Affect, Speech is normal, CN 2-12 intact except has some hearing loss, pain and light touch intact in extremities, Motor exam as listed above.   Non-Invasive Vascular Imaging CAROTID DUPLEX 02/17/2016   No significant stenosis of the bilateral ECA or CCA. 1-39% bilateral ICA stenosis. No significant change compared to exam of 07/02/15.   Assessment: Stacy [name] is a 60 y.o. female who had a right hemispheric embolic stroke as found on MRI of the brain on 06/21/15. She developed numbness in her left upper extremity just prior to the above MRI. She no longer has numbness in her left upper extremity, no known subsequent neurologic events. Today's carotid duplex suggests no significant stenosis of the bilateral ECA or CCA. 1-39% bilateral ICA stenosis. No significant change compared to exam of 07/02/15.  Her atherosclerotic risk factors include  uncontrolled DM, uncontrolled hypertension, and obesity. I advised pt to see her PCP as soon as possible re her elevated blood pressure.  Plan: Follow-up in 1 year with Carotid Duplex scan.   I discussed in depth with the  patient the nature of atherosclerosis, and emphasized the importance of maximal medical management including strict control of blood pressure, blood glucose, and lipid levels, obtaining regular exercise, and continued cessation of smoking.  The patient is aware that without maximal medical management the underlying atherosclerotic disease process will progress, limiting the benefit of any interventions. The patient was given information about stroke prevention and what symptoms should prompt the patient to seek immediate medical care. Thank you for allowing [place] to participate in this patient's care.  Clemon Chambers, RN, MSN, FNP-C [facility] Office: [facility] Physician: Trula Slade  02/17/2016 9:54 AM

## 2016-03-05 DIAGNOSIS — I10 Essential (primary) hypertension: Secondary | ICD-10-CM | POA: Diagnosis not present

## 2016-03-05 DIAGNOSIS — I4891 Unspecified atrial fibrillation: Secondary | ICD-10-CM | POA: Diagnosis not present

## 2016-03-05 DIAGNOSIS — I429 Cardiomyopathy, unspecified: Secondary | ICD-10-CM | POA: Diagnosis not present

## 2016-03-05 DIAGNOSIS — R42 Dizziness and giddiness: Secondary | ICD-10-CM | POA: Diagnosis not present

## 2016-03-25 DIAGNOSIS — M9905 Segmental and somatic dysfunction of pelvic region: Secondary | ICD-10-CM | POA: Diagnosis not present

## 2016-03-25 DIAGNOSIS — M5441 Lumbago with sciatica, right side: Secondary | ICD-10-CM | POA: Diagnosis not present

## 2016-03-25 DIAGNOSIS — M9903 Segmental and somatic dysfunction of lumbar region: Secondary | ICD-10-CM | POA: Diagnosis not present

## 2016-03-25 DIAGNOSIS — M4806 Spinal stenosis, lumbar region: Secondary | ICD-10-CM | POA: Diagnosis not present

## 2016-03-31 ENCOUNTER — Other Ambulatory Visit: Payer: Self-pay | Admitting: "Endocrinology

## 2016-03-31 DIAGNOSIS — E1169 Type 2 diabetes mellitus with other specified complication: Secondary | ICD-10-CM | POA: Diagnosis not present

## 2016-03-31 DIAGNOSIS — I639 Cerebral infarction, unspecified: Secondary | ICD-10-CM | POA: Diagnosis not present

## 2016-03-31 DIAGNOSIS — I10 Essential (primary) hypertension: Secondary | ICD-10-CM | POA: Diagnosis not present

## 2016-03-31 DIAGNOSIS — IMO0002 Reserved for concepts with insufficient information to code with codable children: Secondary | ICD-10-CM

## 2016-03-31 DIAGNOSIS — E1165 Type 2 diabetes mellitus with hyperglycemia: Secondary | ICD-10-CM

## 2016-03-31 DIAGNOSIS — E784 Other hyperlipidemia: Secondary | ICD-10-CM | POA: Diagnosis not present

## 2016-03-31 LAB — BASIC METABOLIC PANEL
BUN: 13 mg/dL (ref 7–25)
CHLORIDE: 99 mmol/L (ref 98–110)
CO2: 29 mmol/L (ref 20–31)
CREATININE: 0.85 mg/dL (ref 0.50–0.99)
Calcium: 9.3 mg/dL (ref 8.6–10.4)
Glucose, Bld: 315 mg/dL — ABNORMAL HIGH (ref 65–99)
Potassium: 3.8 mmol/L (ref 3.5–5.3)
Sodium: 136 mmol/L (ref 135–146)

## 2016-03-31 LAB — HEMOGLOBIN A1C
Hgb A1c MFr Bld: 12.2 % — ABNORMAL HIGH (ref <5.7)
MEAN PLASMA GLUCOSE: 303 mg/dL

## 2016-04-01 DIAGNOSIS — M9905 Segmental and somatic dysfunction of pelvic region: Secondary | ICD-10-CM | POA: Diagnosis not present

## 2016-04-01 DIAGNOSIS — M4806 Spinal stenosis, lumbar region: Secondary | ICD-10-CM | POA: Diagnosis not present

## 2016-04-01 DIAGNOSIS — M9903 Segmental and somatic dysfunction of lumbar region: Secondary | ICD-10-CM | POA: Diagnosis not present

## 2016-04-01 DIAGNOSIS — M5441 Lumbago with sciatica, right side: Secondary | ICD-10-CM | POA: Diagnosis not present

## 2016-04-06 DIAGNOSIS — E119 Type 2 diabetes mellitus without complications: Secondary | ICD-10-CM | POA: Diagnosis not present

## 2016-04-06 DIAGNOSIS — I639 Cerebral infarction, unspecified: Secondary | ICD-10-CM | POA: Diagnosis not present

## 2016-04-06 DIAGNOSIS — E784 Other hyperlipidemia: Secondary | ICD-10-CM | POA: Diagnosis not present

## 2016-04-06 DIAGNOSIS — I10 Essential (primary) hypertension: Secondary | ICD-10-CM | POA: Diagnosis not present

## 2016-04-08 DIAGNOSIS — M9903 Segmental and somatic dysfunction of lumbar region: Secondary | ICD-10-CM | POA: Diagnosis not present

## 2016-04-08 DIAGNOSIS — M5441 Lumbago with sciatica, right side: Secondary | ICD-10-CM | POA: Diagnosis not present

## 2016-04-08 DIAGNOSIS — M4806 Spinal stenosis, lumbar region: Secondary | ICD-10-CM | POA: Diagnosis not present

## 2016-04-08 DIAGNOSIS — M9905 Segmental and somatic dysfunction of pelvic region: Secondary | ICD-10-CM | POA: Diagnosis not present

## 2016-04-15 ENCOUNTER — Encounter: Payer: Self-pay | Admitting: "Endocrinology

## 2016-04-15 ENCOUNTER — Ambulatory Visit (INDEPENDENT_AMBULATORY_CARE_PROVIDER_SITE_OTHER): Payer: Medicare Other | Admitting: "Endocrinology

## 2016-04-15 VITALS — BP 171/83 | HR 77 | Ht 61.0 in | Wt 181.0 lb

## 2016-04-15 DIAGNOSIS — E1122 Type 2 diabetes mellitus with diabetic chronic kidney disease: Secondary | ICD-10-CM | POA: Diagnosis not present

## 2016-04-15 DIAGNOSIS — E785 Hyperlipidemia, unspecified: Secondary | ICD-10-CM | POA: Diagnosis not present

## 2016-04-15 DIAGNOSIS — I6523 Occlusion and stenosis of bilateral carotid arteries: Secondary | ICD-10-CM

## 2016-04-15 DIAGNOSIS — N181 Chronic kidney disease, stage 1: Secondary | ICD-10-CM | POA: Diagnosis not present

## 2016-04-15 DIAGNOSIS — Z794 Long term (current) use of insulin: Secondary | ICD-10-CM | POA: Diagnosis not present

## 2016-04-15 DIAGNOSIS — Z9119 Patient's noncompliance with other medical treatment and regimen: Secondary | ICD-10-CM

## 2016-04-15 DIAGNOSIS — Z91199 Patient's noncompliance with other medical treatment and regimen due to unspecified reason: Secondary | ICD-10-CM | POA: Insufficient documentation

## 2016-04-15 DIAGNOSIS — I10 Essential (primary) hypertension: Secondary | ICD-10-CM | POA: Diagnosis not present

## 2016-04-15 MED ORDER — INSULIN DETEMIR 100 UNIT/ML FLEXPEN: 50.0000 [IU] | PEN_INJECTOR | Freq: Every day | SUBCUTANEOUS | Status: DC

## 2016-04-15 MED ORDER — INSULIN ASPART 100 UNIT/ML FLEXPEN: 10.0000 [IU] | PEN_INJECTOR | Freq: Three times a day (TID) | SUBCUTANEOUS | Status: DC

## 2016-04-15 NOTE — Patient Instructions (Signed)

## 2016-04-15 NOTE — Progress Notes (Signed)
Subjective:    Patient ID: Stacy [name], female    DOB: 1956/01/20, PCP Alonza Bogus, MD   Past Medical History  Diagnosis Date  . HTN (hypertension)   . Hyperlipidemia   . Diabetes mellitus   . Osteoarthritis   . Chronic back pain   . Stroke [facility])    Past Surgical History  Procedure Laterality Date  . Carpel tunnel release      bilateral  . Cesarean section    . Colonoscopy  2006    normal, Dr. Benson [place]  . Esophagogastroduodenoscopy  2006    normal, Dr. Benson Norway   Social History   Social History  . Marital Status: Divorced    Spouse Name: N/A  . Number of Children: N/A  . Years of Education: N/A   Occupational History  .  [employer] [employer]    out due to back injury   Social History Main Topics  . Smoking status: Never Smoker   . Smokeless tobacco: Never Used  . Alcohol Use: No  . Drug Use: No  . Sexual Activity: No   Other Topics Concern  . None   Social History Narrative   Outpatient Encounter Prescriptions as of 04/15/2016  Medication Sig  . amLODipine (NORVASC) 5 MG tablet Take 5 mg by mouth daily.  Marland Kitchen atorvastatin (LIPITOR) 80 MG tablet Take 80 mg by mouth daily.  . B-D UF III MINI PEN NEEDLES 31G X 5 MM MISC AS DIRECTED DX:250.0  . baclofen (LIORESAL) 10 MG tablet Reported on 02/17/2016  . clopidogrel (PLAVIX) 75 MG tablet Take 75 mg by mouth daily.  Marland Kitchen FREESTYLE TEST STRIPS test strip CHECK CBG THREE TIMES DAILY  . insulin aspart (NOVOLOG FLEXPEN) 100 UNIT/ML FlexPen Inject 10-16 Units into the skin 3 (three) times daily with meals.  . Insulin Detemir (LEVEMIR FLEXTOUCH) 100 UNIT/ML Pen Inject 50 Units into the skin at bedtime.  Marland Kitchen losartan-hydrochlorothiazide (HYZAAR) 50-12.5 MG tablet Take 1 tablet by mouth daily.  . [DISCONTINUED] insulin aspart (NOVOLOG FLEXPEN) 100 UNIT/ML FlexPen Inject 10-16 Units into the skin 3 (three) times daily with meals. (Patient not taking: Reported on 04/15/2016)  . [DISCONTINUED] Insulin Detemir (LEVEMIR FLEXTOUCH) 100  UNIT/ML Pen Inject 60 Units into the skin at bedtime. (Patient not taking: Reported on 04/15/2016)   No facility-administered encounter medications on file as of 04/15/2016.   ALLERGIES: No Known Allergies VACCINATION STATUS: Immunization History  Administered Date(s) Administered  . Influenza Whole 07/13/2007, 08/07/2009  . Pneumococcal Polysaccharide-23 04/22/2012  . Td 01/11/2007    Diabetes She presents for her follow-up diabetic visit. She has type 2 diabetes mellitus. Onset time: She was diagnosed at approximate age of 36 years. Her disease course has been improving. There are no hypoglycemic associated symptoms. Pertinent negatives for hypoglycemia include no confusion, headaches, pallor or seizures. Associated symptoms include polyphagia and polyuria. Pertinent negatives for diabetes include no chest pain and no polydipsia. There are no hypoglycemic complications. Symptoms are improving. Diabetic complications include a CVA and nephropathy. Risk factors for coronary artery disease include diabetes mellitus, dyslipidemia, hypertension, obesity and sedentary lifestyle. Current diabetic treatment includes intensive insulin program. Compliance with diabetes treatment: She has history of noncompliance until this visit. Her weight is stable. She is following a generally unhealthy diet. When asked about meal planning, she reported none. She has not had a previous visit with a dietitian. She never (She has chronic back problem.) participates in exercise. Her home blood glucose trend is decreasing steadily. Her breakfast  blood glucose range is generally 180-200 mg/dl. Her lunch blood glucose range is generally 180-200 mg/dl. Her dinner blood glucose range is generally 180-200 mg/dl. Her overall blood glucose range is 180-200 mg/dl. An ACE inhibitor/angiotensin II receptor blocker is being taken.  Hyperlipidemia This is a chronic problem. The current episode started more than 1 year ago. Exacerbating  diseases include diabetes and obesity. Pertinent negatives include no chest pain, leg pain, myalgias or shortness of breath. Current antihyperlipidemic treatment includes statins. Compliance problems: She had history of noncompliance.  Risk factors for coronary artery disease include dyslipidemia, diabetes mellitus, hypertension, obesity and a sedentary lifestyle.  Hypertension This is a chronic problem. The current episode started more than 1 year ago. The problem is uncontrolled. Pertinent negatives include no chest pain, headaches, palpitations or shortness of breath. Risk factors for coronary artery disease include diabetes mellitus, dyslipidemia, obesity and sedentary lifestyle. Past treatments include angiotensin blockers. Hypertensive end-organ damage includes CVA.     Review of Systems  Constitutional: Negative for unexpected weight change.  HENT: Negative for trouble swallowing and voice change.   Eyes: Negative for visual disturbance.  Respiratory: Negative for cough, shortness of breath and wheezing.   Cardiovascular: Negative for chest pain, palpitations and leg swelling.  Gastrointestinal: Negative for nausea, vomiting and diarrhea.  Endocrine: Positive for polyphagia and polyuria. Negative for cold intolerance, heat intolerance and polydipsia.  Musculoskeletal: Negative for myalgias and arthralgias.  Skin: Negative for color change, pallor, rash and wound.  Neurological: Negative for seizures and headaches.  Psychiatric/Behavioral: Negative for suicidal ideas and confusion.    Objective:    BP 171/83 mmHg  Pulse 77  Ht 5\' 1"  (1.549 m)  Wt 181 lb (82.101 kg)  BMI 34.22 kg/m2  SpO2 97%  Wt Readings from Last 3 Encounters:  04/15/16 181 lb (82.101 kg)  01/28/16 193 lb 9.6 oz (87.816 kg)  11/11/15 199 lb (90.266 kg)    Physical Exam  Constitutional: She is oriented to person, place, and time. She appears well-developed.  HENT:  Head: Normocephalic and atraumatic.  Eyes:  EOM are normal.  Neck: Normal range of motion. Neck supple. No tracheal deviation present. No thyromegaly present.  Cardiovascular: Normal rate and regular rhythm.   Pulmonary/Chest: Effort normal and breath sounds normal.  Abdominal: Soft. Bowel sounds are normal. There is no tenderness. There is no guarding.  Musculoskeletal: Normal range of motion. She exhibits no edema.  Neurological: She is alert and oriented to person, place, and time. She has normal reflexes. No cranial nerve deficit. Coordination normal.  Skin: Skin is warm and dry. No rash noted. No erythema. No pallor.  Psychiatric: She has a normal mood and affect. Judgment normal.    Results for orders placed or performed in visit on 03/31/16  Hemoglobin A1c  Result Value Ref Range   Hgb A1c MFr Bld 12.2 (H) <5.7 %   Mean Plasma Glucose 303 mg/dL  Basic Metabolic Panel  Result Value Ref Range   Sodium 136 135 - 146 mmol/L   Potassium 3.8 3.5 - 5.3 mmol/L   Chloride 99 98 - 110 mmol/L   CO2 29 20 - 31 mmol/L   Glucose, Bld 315 (H) 65 - 99 mg/dL   BUN 13 7 - 25 mg/dL   Creat 0.85 0.50 - 0.99 mg/dL   Calcium 9.3 8.6 - 10.4 mg/dL   Chemistry (most recent): Lab Results  Component Value Date   NA 136 03/31/2016   K 3.8 03/31/2016   CL 99  03/31/2016   CO2 29 03/31/2016   BUN 13 03/31/2016   CREATININE 0.85 03/31/2016   Diabetic Labs (most recent): Lab Results  Component Value Date   HGBA1C 12.2* 03/31/2016   HGBA1C 9.5* 09/17/2015   HGBA1C 10.8* 04/15/2012   Lipid Panel     Component Value Date/Time   CHOL 208* 05/02/2012 0945   TRIG 144.0 05/02/2012 0945   HDL 41.20 05/02/2012 0945   CHOLHDL 5 05/02/2012 0945   VLDL 28.8 05/02/2012 0945   LDLCALC 123* 02/13/2011 0842   LDLDIRECT 135.5 05/02/2012 0945      Assessment & Plan:   1. Uncontrolled type 2 diabetes mellitus with other circulatory complication, with long-term current use of insulin ([place])   - her diabetes is  complicated by carotid stenosis  and reversed mild CK D and patient remains at a high risk for more acute and chronic complications of diabetes which include CAD, CVA, CKD, retinopathy, and neuropathy. These are all discussed in detail with the patient.  Patient did not show up since December 2016 and unfortunately stopped her basal/bolus insulin. Her A1c has increased to 12.5% , from  9.5% .  Recent labs reviewed.   - I have re-counseled the patient on diet management and weight loss  by adopting a carbohydrate restricted / protein rich  Diet.  - Suggestion is made for patient to avoid simple carbohydrates   from their diet including Cakes , Desserts, Ice Cream,  Soda (  diet and regular) , Sweet Tea , Candies,  Chips, Cookies, Artificial Sweeteners,   and "Sugar-free" Products .  This will help patient to have stable blood glucose profile and potentially avoid unintended  Weight gain.  - Patient is advised to stick to a routine mealtimes to eat 3 meals  a day and avoid unnecessary snacks ( to snack only to correct hypoglycemia).  - The patient  will be  scheduled with Jearld Fenton, RDN, CDE for individualized DM education.  - I have approached patient with the following individualized plan to manage diabetes and patient agrees.  - Patient is alarmingly noncompliant and easily disengages from self-care.  - Once again, she promises to engage better. Emphasizing the need for strict compliance,  I will resume  Levemir  At 50 units qhs, and NovoLog 10 units TIDAC for premeal BG readings of 90-150mg /dl, plus patient specific sliding scale insulin for correction of unexpected hyperglycemia above 150mg /dl, associated with strict monitoring of BG AC and HS.  -Adjustment parameters for hypo and hyperglycemia were given in a written document to patient. -Patient is encouraged to call clinic for blood glucose levels less than 70 or above 300 mg /dl. -Hold Invokana for now, she does not tolerate metformin. - If she cannot afford insulin  analogs, she will be considered for Novolin 70/30 to use twice a day.  - Patient specific target  for A1c; LDL, HDL, Triglycerides, and  Waist Circumference were discussed in detail.  2) BP/HTN: Uncontrolled due to the fact that she did not take her Hyzaar this morning. I urged her to resume and Continue current medications including ACEI/ARB. 3) Lipids/HPL:  She has not been taking her Zocor nor Lipitor or so both are on her medication list. I discontinued Lipitor and advised her to continue Zocor 20 mg by mouth daily at bedtime. Marland Kitchen  4)  Weight/Diet: I would initiate CDE consult, exercise, and carbohydrates information provided.  5) Chronic Care/Health Maintenance:  -Patient  Is  on ACEI/ARB and Statin medications and  encouraged to continue to follow up with Ophthalmology, Podiatrist at least yearly or according to recommendations, and advised to  stay away from smoking. I have recommended yearly flu vaccine and pneumonia vaccination at least every 5 years; moderate intensity exercise for up to 150 minutes weekly; and  sleep for at least 7 hours a day.  - 25 minutes of time was spent on the care of this patient , 50% of which was applied for counseling on diabetes complications and their preventions.  - I advised patient to maintain close follow up with HAWKINS,EDWARD L, MD for primary care needs.  Patient is asked to bring meter and  blood glucose logs during their next visit.   Follow up plan: -Return in about 1 week (around 04/22/2016) for follow up with meter and logs- no labs.  Glade Lloyd, MD Phone: [phone]  Fax: [phone]   04/15/2016, 3:10 PM

## 2016-04-22 DIAGNOSIS — M9903 Segmental and somatic dysfunction of lumbar region: Secondary | ICD-10-CM | POA: Diagnosis not present

## 2016-04-22 DIAGNOSIS — M9905 Segmental and somatic dysfunction of pelvic region: Secondary | ICD-10-CM | POA: Diagnosis not present

## 2016-04-22 DIAGNOSIS — M9902 Segmental and somatic dysfunction of thoracic region: Secondary | ICD-10-CM | POA: Diagnosis not present

## 2016-04-22 DIAGNOSIS — M5441 Lumbago with sciatica, right side: Secondary | ICD-10-CM | POA: Diagnosis not present

## 2016-04-22 DIAGNOSIS — M4806 Spinal stenosis, lumbar region: Secondary | ICD-10-CM | POA: Diagnosis not present

## 2016-04-27 ENCOUNTER — Encounter: Payer: Self-pay | Admitting: "Endocrinology

## 2016-04-27 ENCOUNTER — Ambulatory Visit (INDEPENDENT_AMBULATORY_CARE_PROVIDER_SITE_OTHER): Payer: Medicare Other | Admitting: "Endocrinology

## 2016-04-27 VITALS — BP 141/76 | HR 75 | Ht 61.0 in | Wt 189.0 lb

## 2016-04-27 DIAGNOSIS — I10 Essential (primary) hypertension: Secondary | ICD-10-CM | POA: Diagnosis not present

## 2016-04-27 DIAGNOSIS — E785 Hyperlipidemia, unspecified: Secondary | ICD-10-CM

## 2016-04-27 DIAGNOSIS — I6523 Occlusion and stenosis of bilateral carotid arteries: Secondary | ICD-10-CM | POA: Diagnosis not present

## 2016-04-27 DIAGNOSIS — E1122 Type 2 diabetes mellitus with diabetic chronic kidney disease: Secondary | ICD-10-CM

## 2016-04-27 DIAGNOSIS — Z794 Long term (current) use of insulin: Secondary | ICD-10-CM

## 2016-04-27 DIAGNOSIS — N181 Chronic kidney disease, stage 1: Secondary | ICD-10-CM

## 2016-04-27 MED ORDER — INSULIN ASPART 100 UNIT/ML FLEXPEN: 12.0000 [IU] | PEN_INJECTOR | Freq: Three times a day (TID) | SUBCUTANEOUS | Status: DC

## 2016-04-27 NOTE — Progress Notes (Signed)
Subjective:    Patient ID: Stacy [name], female    DOB: 12-Feb-1956, PCP Alonza Bogus, MD   Past Medical History  Diagnosis Date  . HTN (hypertension)   . Hyperlipidemia   . Diabetes mellitus   . Osteoarthritis   . Chronic back pain   . Stroke [facility])    Past Surgical History  Procedure Laterality Date  . Carpel tunnel release      bilateral  . Cesarean section    . Colonoscopy  2006    normal, Dr. Benson [place]  . Esophagogastroduodenoscopy  2006    normal, Dr. Benson Norway   Social History   Social History  . Marital Status: Divorced    Spouse Name: N/A  . Number of Children: N/A  . Years of Education: N/A   Occupational History  .  [employer]    out due to back injury   Social History Main Topics  . Smoking status: Never Smoker   . Smokeless tobacco: Never Used  . Alcohol Use: No  . Drug Use: No  . Sexual Activity: No   Other Topics Concern  . None   Social History Narrative   Outpatient Encounter Prescriptions as of 04/27/2016  Medication Sig  . amLODipine (NORVASC) 5 MG tablet Take 5 mg by mouth daily.  Marland Kitchen atorvastatin (LIPITOR) 80 MG tablet Take 80 mg by mouth daily.  . B-D UF III MINI PEN NEEDLES 31G X 5 MM MISC AS DIRECTED DX:250.0  . baclofen (LIORESAL) 10 MG tablet Reported on 02/17/2016  . clopidogrel (PLAVIX) 75 MG tablet Take 75 mg by mouth daily.  Marland Kitchen FREESTYLE TEST STRIPS test strip CHECK CBG THREE TIMES DAILY  . insulin aspart (NOVOLOG FLEXPEN) 100 UNIT/ML FlexPen Inject 12-18 Units into the skin 3 (three) times daily with meals.  . Insulin Detemir (LEVEMIR FLEXTOUCH) 100 UNIT/ML Pen Inject 50 Units into the skin at bedtime.  Marland Kitchen losartan-hydrochlorothiazide (HYZAAR) 50-12.5 MG tablet Take 1 tablet by mouth daily.  . [DISCONTINUED] insulin aspart (NOVOLOG FLEXPEN) 100 UNIT/ML FlexPen Inject 10-16 Units into the skin 3 (three) times daily with meals.   No facility-administered encounter medications on file as of 04/27/2016.   ALLERGIES: No Known  Allergies VACCINATION STATUS: Immunization History  Administered Date(s) Administered  . Influenza Whole 07/13/2007, 08/07/2009  . Pneumococcal Polysaccharide-23 04/22/2012  . Td 01/11/2007    Diabetes She presents for her follow-up diabetic visit. She has type 2 diabetes mellitus. Onset time: She was diagnosed at approximate age of 4 years. Her disease course has been improving. There are no hypoglycemic associated symptoms. Pertinent negatives for hypoglycemia include no confusion, headaches, pallor or seizures. Associated symptoms include polyphagia and polyuria. Pertinent negatives for diabetes include no chest pain and no polydipsia. There are no hypoglycemic complications. Symptoms are improving. Diabetic complications include a CVA and nephropathy. Risk factors for coronary artery disease include diabetes mellitus, dyslipidemia, hypertension, obesity and sedentary lifestyle. Current diabetic treatment includes intensive insulin program. Compliance with diabetes treatment: She has history of noncompliance . Her weight is stable. She is following a generally unhealthy diet. When asked about meal planning, she reported none. She has not had a previous visit with a dietitian. She never (She has chronic back problem.) participates in exercise. Her home blood glucose trend is decreasing steadily. Her breakfast blood glucose range is generally 180-200 mg/dl. Her lunch blood glucose range is generally 180-200 mg/dl. Her dinner blood glucose range is generally 180-200 mg/dl. Her overall blood glucose range is 180-200  mg/dl. An ACE inhibitor/angiotensin II receptor blocker is being taken.  Hyperlipidemia This is a chronic problem. The current episode started more than 1 year ago. Exacerbating diseases include diabetes and obesity. Pertinent negatives include no chest pain, leg pain, myalgias or shortness of breath. Current antihyperlipidemic treatment includes statins. Compliance problems: She had history  of noncompliance.  Risk factors for coronary artery disease include dyslipidemia, diabetes mellitus, hypertension, obesity and a sedentary lifestyle.  Hypertension This is a chronic problem. The current episode started more than 1 year ago. The problem is uncontrolled. Pertinent negatives include no chest pain, headaches, palpitations or shortness of breath. Risk factors for coronary artery disease include diabetes mellitus, dyslipidemia, obesity and sedentary lifestyle. Past treatments include angiotensin blockers. Hypertensive end-organ damage includes CVA.     Review of Systems  Constitutional: Negative for unexpected weight change.  HENT: Negative for trouble swallowing and voice change.   Eyes: Negative for visual disturbance.  Respiratory: Negative for cough, shortness of breath and wheezing.   Cardiovascular: Negative for chest pain, palpitations and leg swelling.  Gastrointestinal: Negative for nausea, vomiting and diarrhea.  Endocrine: Positive for polyphagia and polyuria. Negative for cold intolerance, heat intolerance and polydipsia.  Musculoskeletal: Negative for myalgias and arthralgias.  Skin: Negative for color change, pallor, rash and wound.  Neurological: Negative for seizures and headaches.  Psychiatric/Behavioral: Negative for suicidal ideas and confusion.    Objective:    BP 141/76 mmHg  Pulse 75  Ht 5\' 1"  (1.549 m)  Wt 189 lb (85.73 kg)  BMI 35.73 kg/m2  Wt Readings from Last 3 Encounters:  04/27/16 189 lb (85.73 kg)  04/15/16 181 lb (82.101 kg)  01/28/16 193 lb 9.6 oz (87.816 kg)    Physical Exam  Constitutional: She is oriented to person, place, and time. She appears well-developed.  HENT:  Head: Normocephalic and atraumatic.  Eyes: EOM are normal.  Neck: Normal range of motion. Neck supple. No tracheal deviation present. No thyromegaly present.  Cardiovascular: Normal rate and regular rhythm.   Pulmonary/Chest: Effort normal and breath sounds normal.   Abdominal: Soft. Bowel sounds are normal. There is no tenderness. There is no guarding.  Musculoskeletal: Normal range of motion. She exhibits no edema.  Neurological: She is alert and oriented to person, place, and time. She has normal reflexes. No cranial nerve deficit. Coordination normal.  Skin: Skin is warm and dry. No rash noted. No erythema. No pallor.  Psychiatric: She has a normal mood and affect. Judgment normal.    Results for orders placed or performed in visit on 03/31/16  Hemoglobin A1c  Result Value Ref Range   Hgb A1c MFr Bld 12.2 (H) <5.7 %   Mean Plasma Glucose 303 mg/dL  Basic Metabolic Panel  Result Value Ref Range   Sodium 136 135 - 146 mmol/L   Potassium 3.8 3.5 - 5.3 mmol/L   Chloride 99 98 - 110 mmol/L   CO2 29 20 - 31 mmol/L   Glucose, Bld 315 (H) 65 - 99 mg/dL   BUN 13 7 - 25 mg/dL   Creat 0.85 0.50 - 0.99 mg/dL   Calcium 9.3 8.6 - 10.4 mg/dL   Chemistry (most recent): Lab Results  Component Value Date   NA 136 03/31/2016   K 3.8 03/31/2016   CL 99 03/31/2016   CO2 29 03/31/2016   BUN 13 03/31/2016   CREATININE 0.85 03/31/2016   Diabetic Labs (most recent): Lab Results  Component Value Date   HGBA1C 12.2* 03/31/2016  HGBA1C 9.5* 09/17/2015   HGBA1C 10.8* 04/15/2012   Lipid Panel     Component Value Date/Time   CHOL 208* 05/02/2012 0945   TRIG 144.0 05/02/2012 0945   HDL 41.20 05/02/2012 0945   CHOLHDL 5 05/02/2012 0945   VLDL 28.8 05/02/2012 0945   LDLCALC 123* 02/13/2011 0842   LDLDIRECT 135.5 05/02/2012 0945      Assessment & Plan:   1. Uncontrolled type 2 diabetes mellitus with other circulatory complication, with long-term current use of insulin ([place])   - her diabetes is  complicated by carotid stenosis and reversed mild CK D and patient remains at a high risk for more acute and chronic complications of diabetes which include CAD, CVA, CKD, retinopathy, and neuropathy. These are all discussed in detail with the  patient.  Patient did  come with him better engagement and improved glycemic profile. Over the last 2 weeks her average blood glucose dropped to 192.Her most recent A1c was increasing to 12.5% from 9.5%.     Recent labs reviewed.   - I have re-counseled the patient on diet management and weight loss  by adopting a carbohydrate restricted / protein rich  Diet.  - Suggestion is made for patient to avoid simple carbohydrates   from their diet including Cakes , Desserts, Ice Cream,  Soda (  diet and regular) , Sweet Tea , Candies,  Chips, Cookies, Artificial Sweeteners,   and "Sugar-free" Products .  This will help patient to have stable blood glucose profile and potentially avoid unintended  Weight gain.  - Patient is advised to stick to a routine mealtimes to eat 3 meals  a day and avoid unnecessary snacks ( to snack only to correct hypoglycemia).  - The patient  will be  scheduled with Jearld Fenton, RDN, CDE for individualized DM education.  - I have approached patient with the following individualized plan to manage diabetes and patient agrees.  - Patient has been alarmingly noncompliant and easily disengages from self-care.  - She came with better engagement since last visit, I will continue  Levemir at 50 units qhs, and increase NovoLog to 12 units [facility] for premeal BG readings of 90-150mg /dl, plus patient specific sliding scale insulin for correction of unexpected hyperglycemia above 150mg /dl, associated with strict monitoring of BG AC and HS.  -Adjustment parameters for hypo and hyperglycemia were given in a written document to patient. -Patient is encouraged to call clinic for blood glucose levels less than 70 or above 300 mg /dl. -Hold Invokana for now, she does not tolerate metformin. - If she cannot afford insulin analogs, she will be considered for Novolin 70/30 to use twice a day.  - Patient specific target  for A1c; LDL, HDL, Triglycerides, and  Waist Circumference were discussed  in detail.  2) BP/HTN: Uncontrolled due to the fact that she did not take her Hyzaar this morning. I urged her to resume and Continue current medications including ACEI/ARB. 3) Lipids/HPL:  She has not been taking her Zocor nor Lipitor or so both are on her medication list. I discontinued Lipitor and advised her to continue Zocor 20 mg by mouth daily at bedtime. Marland Kitchen  4)  Weight/Diet: I would initiate CDE consult, exercise, and carbohydrates information provided.  5) Chronic Care/Health Maintenance:  -Patient  Is  on ACEI/ARB and Statin medications and encouraged to continue to follow up with Ophthalmology, Podiatrist at least yearly or according to recommendations, and advised to  stay away from smoking. I have recommended yearly  flu vaccine and pneumonia vaccination at least every 5 years; moderate intensity exercise for up to 150 minutes weekly; and  sleep for at least 7 hours a day.  - 25 minutes of time was spent on the care of this patient , 50% of which was applied for counseling on diabetes complications and their preventions.  - I advised patient to maintain close follow up with HAWKINS,EDWARD L, MD for primary care needs.  Patient is asked to bring meter and  blood glucose logs during their next visit.   Follow up plan: -Return in about 2 weeks (around 05/11/2016) for follow up with meter and logs- no labs.  Glade Lloyd, MD Phone: [phone]  Fax: [phone]   04/27/2016, 9:51 AM

## 2016-04-29 DIAGNOSIS — M9903 Segmental and somatic dysfunction of lumbar region: Secondary | ICD-10-CM | POA: Diagnosis not present

## 2016-04-29 DIAGNOSIS — M4806 Spinal stenosis, lumbar region: Secondary | ICD-10-CM | POA: Diagnosis not present

## 2016-04-29 DIAGNOSIS — M9905 Segmental and somatic dysfunction of pelvic region: Secondary | ICD-10-CM | POA: Diagnosis not present

## 2016-04-29 DIAGNOSIS — M9902 Segmental and somatic dysfunction of thoracic region: Secondary | ICD-10-CM | POA: Diagnosis not present

## 2016-04-29 DIAGNOSIS — M5441 Lumbago with sciatica, right side: Secondary | ICD-10-CM | POA: Diagnosis not present

## 2016-05-11 ENCOUNTER — Encounter: Payer: Self-pay | Admitting: "Endocrinology

## 2016-05-11 ENCOUNTER — Ambulatory Visit (INDEPENDENT_AMBULATORY_CARE_PROVIDER_SITE_OTHER): Payer: Medicare Other | Admitting: "Endocrinology

## 2016-05-11 VITALS — BP 153/81 | HR 82 | Ht 61.0 in | Wt 189.8 lb

## 2016-05-11 DIAGNOSIS — E1122 Type 2 diabetes mellitus with diabetic chronic kidney disease: Secondary | ICD-10-CM | POA: Diagnosis not present

## 2016-05-11 DIAGNOSIS — Z794 Long term (current) use of insulin: Secondary | ICD-10-CM

## 2016-05-11 DIAGNOSIS — N181 Chronic kidney disease, stage 1: Secondary | ICD-10-CM

## 2016-05-11 DIAGNOSIS — I6523 Occlusion and stenosis of bilateral carotid arteries: Secondary | ICD-10-CM

## 2016-05-11 DIAGNOSIS — I10 Essential (primary) hypertension: Secondary | ICD-10-CM

## 2016-05-11 DIAGNOSIS — E785 Hyperlipidemia, unspecified: Secondary | ICD-10-CM

## 2016-05-11 NOTE — Progress Notes (Signed)
Subjective:    Patient ID: Stacy [name], female    DOB: 1956/01/13, PCP Alonza Bogus, MD   Past Medical History:  Diagnosis Date  . Chronic back pain   . Diabetes mellitus   . HTN (hypertension)   . Hyperlipidemia   . Osteoarthritis   . Stroke [facility])    Past Surgical History:  Procedure Laterality Date  . carpel tunnel release     bilateral  . CESAREAN SECTION    . COLONOSCOPY  2006   normal, Dr. Benson [place]  . ESOPHAGOGASTRODUODENOSCOPY  2006   normal, Dr. Benson Norway   Social History   Social History  . Marital status: Divorced    Spouse name: N/A  . Number of children: N/A  . Years of education: N/A   Occupational History  .  [employer]    out due to back injury   Social History Main Topics  . Smoking status: Never Smoker  . Smokeless tobacco: Never Used  . Alcohol use No  . Drug use: No  . Sexual activity: No   Other Topics Concern  . None   Social History Narrative  . None   Outpatient Encounter Prescriptions as of 05/11/2016  Medication Sig  . amLODipine (NORVASC) 5 MG tablet Take 5 mg by mouth daily.  Marland Kitchen atorvastatin (LIPITOR) 80 MG tablet Take 80 mg by mouth daily.  . B-D UF III MINI PEN NEEDLES 31G X 5 MM MISC AS DIRECTED DX:250.0  . baclofen (LIORESAL) 10 MG tablet Reported on 02/17/2016  . clopidogrel (PLAVIX) 75 MG tablet Take 75 mg by mouth daily.  Marland Kitchen FREESTYLE TEST STRIPS test strip CHECK CBG THREE TIMES DAILY  . insulin aspart (NOVOLOG FLEXPEN) 100 UNIT/ML FlexPen Inject 12-18 Units into the skin 3 (three) times daily with meals.  . Insulin Detemir (LEVEMIR FLEXTOUCH) 100 UNIT/ML Pen Inject 50 Units into the skin at bedtime.  Marland Kitchen losartan-hydrochlorothiazide (HYZAAR) 50-12.5 MG tablet Take 1 tablet by mouth daily.   No facility-administered encounter medications on file as of 05/11/2016.    ALLERGIES: No Known Allergies VACCINATION STATUS: Immunization History  Administered Date(s) Administered  . Influenza Whole 07/13/2007, 08/07/2009  .  Pneumococcal Polysaccharide-23 04/22/2012  . Td 01/11/2007    Diabetes  She presents for her follow-up diabetic visit. She has type 2 diabetes mellitus. Onset time: She was diagnosed at approximate age of 38 years. Her disease course has been improving. There are no hypoglycemic associated symptoms. Pertinent negatives for hypoglycemia include no confusion, headaches, pallor or seizures. Associated symptoms include polyphagia and polyuria. Pertinent negatives for diabetes include no chest pain and no polydipsia. There are no hypoglycemic complications. Symptoms are improving. Diabetic complications include a CVA and nephropathy. Risk factors for coronary artery disease include diabetes mellitus, dyslipidemia, hypertension, obesity and sedentary lifestyle. Current diabetic treatment includes intensive insulin program. Compliance with diabetes treatment: She has history of noncompliance . Her weight is increasing steadily. She is following a generally unhealthy diet. When asked about meal planning, she reported none. She has not had a previous visit with a dietitian. She never (She has chronic back problem.) participates in exercise. Her home blood glucose trend is decreasing steadily. Her breakfast blood glucose range is generally 130-140 mg/dl. Her lunch blood glucose range is generally 180-200 mg/dl. Her dinner blood glucose range is generally 180-200 mg/dl. Her overall blood glucose range is 180-200 mg/dl. An ACE inhibitor/angiotensin II receptor blocker is being taken.  Hyperlipidemia  This is a chronic problem. The current  episode started more than 1 year ago. Exacerbating diseases include diabetes and obesity. Pertinent negatives include no chest pain, leg pain, myalgias or shortness of breath. Current antihyperlipidemic treatment includes statins. Compliance problems: She had history of noncompliance.  Risk factors for coronary artery disease include dyslipidemia, diabetes mellitus, hypertension,  obesity and a sedentary lifestyle.  Hypertension  This is a chronic problem. The current episode started more than 1 year ago. The problem is uncontrolled. Pertinent negatives include no chest pain, headaches, palpitations or shortness of breath. Risk factors for coronary artery disease include diabetes mellitus, dyslipidemia, obesity and sedentary lifestyle. Past treatments include angiotensin blockers. Hypertensive end-organ damage includes CVA.     Review of Systems  Constitutional: Negative for unexpected weight change.  HENT: Negative for trouble swallowing and voice change.   Eyes: Negative for visual disturbance.  Respiratory: Negative for cough, shortness of breath and wheezing.   Cardiovascular: Negative for chest pain, palpitations and leg swelling.  Gastrointestinal: Negative for diarrhea, nausea and vomiting.  Endocrine: Positive for polyphagia and polyuria. Negative for cold intolerance, heat intolerance and polydipsia.  Musculoskeletal: Negative for arthralgias and myalgias.  Skin: Negative for color change, pallor, rash and wound.  Neurological: Negative for seizures and headaches.  Psychiatric/Behavioral: Negative for confusion and suicidal ideas.    Objective:    BP (!) 153/81 (BP Location: Right Arm, Patient Position: Sitting, Cuff Size: Large)   Pulse 82   Ht 5\' 1"  (1.549 m)   Wt 189 lb 12.8 oz (86.1 kg)   SpO2 96%   BMI 35.86 kg/m   Wt Readings from Last 3 Encounters:  05/11/16 189 lb 12.8 oz (86.1 kg)  04/27/16 189 lb (85.7 kg)  04/15/16 181 lb (82.1 kg)    Physical Exam  Constitutional: She is oriented to person, place, and time. She appears well-developed.  HENT:  Head: Normocephalic and atraumatic.  Eyes: EOM are normal.  Neck: Normal range of motion. Neck supple. No tracheal deviation present. No thyromegaly present.  Cardiovascular: Normal rate and regular rhythm.   Pulmonary/Chest: Effort normal and breath sounds normal.  Abdominal: Soft. Bowel  sounds are normal. There is no tenderness. There is no guarding.  Musculoskeletal: Normal range of motion. She exhibits no edema.  Neurological: She is alert and oriented to person, place, and time. She has normal reflexes. No cranial nerve deficit. Coordination normal.  Skin: Skin is warm and dry. No rash noted. No erythema. No pallor.  Psychiatric: She has a normal mood and affect. Judgment normal.    Results for orders placed or performed in visit on 03/31/16  Hemoglobin A1c  Result Value Ref Range   Hgb A1c MFr Bld 12.2 (H) <5.7 %   Mean Plasma Glucose 303 mg/dL  Basic Metabolic Panel  Result Value Ref Range   Sodium 136 135 - 146 mmol/L   Potassium 3.8 3.5 - 5.3 mmol/L   Chloride 99 98 - 110 mmol/L   CO2 29 20 - 31 mmol/L   Glucose, Bld 315 (H) 65 - 99 mg/dL   BUN 13 7 - 25 mg/dL   Creat 0.85 0.50 - 0.99 mg/dL   Calcium 9.3 8.6 - 10.4 mg/dL   Chemistry (most recent): Lab Results  Component Value Date   NA 136 03/31/2016   K 3.8 03/31/2016   CL 99 03/31/2016   CO2 29 03/31/2016   BUN 13 03/31/2016   CREATININE 0.85 03/31/2016   Diabetic Labs (most recent): Lab Results  Component Value Date   HGBA1C 12.2 (  H) 03/31/2016   HGBA1C 9.5 (A) 09/17/2015   HGBA1C 10.8 (H) 04/15/2012   Lipid Panel     Component Value Date/Time   CHOL 208 (H) 05/02/2012 0945   TRIG 144.0 05/02/2012 0945   HDL 41.20 05/02/2012 0945   CHOLHDL 5 05/02/2012 0945   VLDL 28.8 05/02/2012 0945   LDLCALC 123 (H) 02/13/2011 0842   LDLDIRECT 135.5 05/02/2012 0945      Assessment & Plan:   1. Uncontrolled type 2 diabetes mellitus with other circulatory complication, with long-term current use of insulin ([place])   - her diabetes is  complicated by carotid stenosis and reversed mild CK D and patient remains at a high risk for more acute and chronic complications of diabetes which include CAD, CVA, CKD, retinopathy, and neuropathy. These are all discussed in detail with the patient.  Patient did   come with him better engagement and improved glycemic profile. Over the last 2 weeks her average blood glucose dropped to 192.Her most recent A1c was increasing to 12.5% from 9.5%.     Recent labs reviewed.   - I have re-counseled the patient on diet management and weight loss  by adopting a carbohydrate restricted / protein rich  Diet.  - Suggestion is made for patient to avoid simple carbohydrates   from their diet including Cakes , Desserts, Ice Cream,  Soda (  diet and regular) , Sweet Tea , Candies,  Chips, Cookies, Artificial Sweeteners,   and "Sugar-free" Products .  This will help patient to have stable blood glucose profile and potentially avoid unintended  Weight gain.  - Patient is advised to stick to a routine mealtimes to eat 3 meals  a day and avoid unnecessary snacks ( to snack only to correct hypoglycemia).  - The patient  will be  scheduled with Jearld Fenton, RDN, CDE for individualized DM education.  - I have approached patient with the following individualized plan to manage diabetes and patient agrees.  - Patient has been alarmingly noncompliant and easily disengages from self-care.  - She came with better engagement since last visit, I will continue  Levemir at 50 units qhs, and decrease NovoLog to 10 units TIDAC for premeal BG readings of 90-150mg /dl, plus patient specific sliding scale insulin for correction of unexpected hyperglycemia above 150mg /dl, associated with strict monitoring of BG AC and HS.  -Adjustment parameters for hypo and hyperglycemia were given in a written document to patient. -Patient is encouraged to call clinic for blood glucose levels less than 70 or above 300 mg /dl. -Hold Invokana for now, she does not tolerate metformin. - If she cannot afford insulin analogs, she will be considered for Novolin 70/30 to use twice a day.  - Patient specific target  for A1c; LDL, HDL, Triglycerides, and  Waist Circumference were discussed in detail.  2) BP/HTN:  Uncontrolled. I urged her to resume and Continue current medications including ACEI/ARB. 3) Lipids/HPL:   advised her to continue Zocor 20 mg by mouth daily at bedtime. Marland Kitchen  4)  Weight/Diet: I would initiate CDE consult, exercise, and carbohydrates information provided.  5) Chronic Care/Health Maintenance:  -Patient  Is  on ACEI/ARB and Statin medications and encouraged to continue to follow up with Ophthalmology, Podiatrist at least yearly or according to recommendations, and advised to  stay away from smoking. I have recommended yearly flu vaccine and pneumonia vaccination at least every 5 years; moderate intensity exercise for up to 150 minutes weekly; and  sleep for at least  7 hours a day.  - 25 minutes of time was spent on the care of this patient , 50% of which was applied for counseling on diabetes complications and their preventions.  - I advised patient to maintain close follow up with HAWKINS,EDWARD L, MD for primary care needs.  Patient is asked to bring meter and  blood glucose logs during their next visit.   Follow up plan: -Return in about 4 weeks (around 06/08/2016) for follow up with meter and logs- no labs.  Glade Lloyd, MD Phone: [phone]  Fax: [phone]   05/11/2016, 4:55 PM

## 2016-05-13 DIAGNOSIS — M9902 Segmental and somatic dysfunction of thoracic region: Secondary | ICD-10-CM | POA: Diagnosis not present

## 2016-05-13 DIAGNOSIS — M9903 Segmental and somatic dysfunction of lumbar region: Secondary | ICD-10-CM | POA: Diagnosis not present

## 2016-05-13 DIAGNOSIS — M5441 Lumbago with sciatica, right side: Secondary | ICD-10-CM | POA: Diagnosis not present

## 2016-05-13 DIAGNOSIS — M4806 Spinal stenosis, lumbar region: Secondary | ICD-10-CM | POA: Diagnosis not present

## 2016-05-13 DIAGNOSIS — M9905 Segmental and somatic dysfunction of pelvic region: Secondary | ICD-10-CM | POA: Diagnosis not present

## 2016-05-27 DIAGNOSIS — M9905 Segmental and somatic dysfunction of pelvic region: Secondary | ICD-10-CM | POA: Diagnosis not present

## 2016-05-27 DIAGNOSIS — M5441 Lumbago with sciatica, right side: Secondary | ICD-10-CM | POA: Diagnosis not present

## 2016-05-27 DIAGNOSIS — M9902 Segmental and somatic dysfunction of thoracic region: Secondary | ICD-10-CM | POA: Diagnosis not present

## 2016-05-27 DIAGNOSIS — M4806 Spinal stenosis, lumbar region: Secondary | ICD-10-CM | POA: Diagnosis not present

## 2016-05-27 DIAGNOSIS — M9903 Segmental and somatic dysfunction of lumbar region: Secondary | ICD-10-CM | POA: Diagnosis not present

## 2016-06-10 DIAGNOSIS — M4806 Spinal stenosis, lumbar region: Secondary | ICD-10-CM | POA: Diagnosis not present

## 2016-06-10 DIAGNOSIS — M9905 Segmental and somatic dysfunction of pelvic region: Secondary | ICD-10-CM | POA: Diagnosis not present

## 2016-06-10 DIAGNOSIS — M9902 Segmental and somatic dysfunction of thoracic region: Secondary | ICD-10-CM | POA: Diagnosis not present

## 2016-06-10 DIAGNOSIS — M9903 Segmental and somatic dysfunction of lumbar region: Secondary | ICD-10-CM | POA: Diagnosis not present

## 2016-06-10 DIAGNOSIS — M5441 Lumbago with sciatica, right side: Secondary | ICD-10-CM | POA: Diagnosis not present

## 2016-06-11 ENCOUNTER — Ambulatory Visit (INDEPENDENT_AMBULATORY_CARE_PROVIDER_SITE_OTHER): Payer: Medicare Other | Admitting: "Endocrinology

## 2016-06-11 ENCOUNTER — Encounter: Payer: Self-pay | Admitting: "Endocrinology

## 2016-06-11 VITALS — BP 168/92 | HR 70 | Ht 61.0 in | Wt 192.0 lb

## 2016-06-11 DIAGNOSIS — I6523 Occlusion and stenosis of bilateral carotid arteries: Secondary | ICD-10-CM | POA: Diagnosis not present

## 2016-06-11 DIAGNOSIS — I10 Essential (primary) hypertension: Secondary | ICD-10-CM

## 2016-06-11 DIAGNOSIS — E785 Hyperlipidemia, unspecified: Secondary | ICD-10-CM | POA: Diagnosis not present

## 2016-06-11 DIAGNOSIS — E1159 Type 2 diabetes mellitus with other circulatory complications: Secondary | ICD-10-CM

## 2016-06-11 NOTE — Progress Notes (Signed)
Subjective:    Patient ID: Stacy [name], female    DOB: 1955/12/09, PCP Alonza Bogus, MD   Past Medical History:  Diagnosis Date  . Chronic back pain   . Diabetes mellitus   . HTN (hypertension)   . Hyperlipidemia   . Osteoarthritis   . Stroke [facility])    Past Surgical History:  Procedure Laterality Date  . carpel tunnel release     bilateral  . CESAREAN SECTION    . COLONOSCOPY  2006   normal, Dr. Benson Norway  . ESOPHAGOGASTRODUODENOSCOPY  2006   normal, Dr. Benson Norway   Social History   Social History  . Marital status: Divorced    Spouse name: N/A  . Number of children: N/A  . Years of education: N/A   Occupational History  .  [employer]    out due to back injury   Social History Main Topics  . Smoking status: Never Smoker  . Smokeless tobacco: Never Used  . Alcohol use No  . Drug use: No  . Sexual activity: No   Other Topics Concern  . None   Social History Narrative  . None   Outpatient Encounter Prescriptions as of 06/11/2016  Medication Sig  . amLODipine (NORVASC) 5 MG tablet Take 5 mg by mouth daily.  Marland Kitchen atorvastatin (LIPITOR) 80 MG tablet Take 80 mg by mouth daily.  . B-D UF III MINI PEN NEEDLES 31G X 5 MM MISC AS DIRECTED DX:250.0  . baclofen (LIORESAL) 10 MG tablet Reported on 02/17/2016  . clopidogrel (PLAVIX) 75 MG tablet Take 75 mg by mouth daily.  Marland Kitchen FREESTYLE TEST STRIPS test strip CHECK CBG THREE TIMES DAILY  . insulin aspart (NOVOLOG FLEXPEN) 100 UNIT/ML FlexPen Inject 12-18 Units into the skin 3 (three) times daily with meals.  . Insulin Detemir (LEVEMIR FLEXTOUCH) 100 UNIT/ML Pen Inject 50 Units into the skin at bedtime.  Marland Kitchen losartan-hydrochlorothiazide (HYZAAR) 50-12.5 MG tablet Take 1 tablet by mouth daily.   No facility-administered encounter medications on file as of 06/11/2016.    ALLERGIES: No Known Allergies VACCINATION STATUS: Immunization History  Administered Date(s) Administered  . Influenza Whole 07/13/2007, 08/07/2009  .  Pneumococcal Polysaccharide-23 04/22/2012  . Td 01/11/2007    Diabetes  She presents for her follow-up diabetic visit. She has type 2 diabetes mellitus. Onset time: She was diagnosed at approximate age of 68 years. Her disease course has been improving. There are no hypoglycemic associated symptoms. Pertinent negatives for hypoglycemia include no confusion, headaches, pallor or seizures. Associated symptoms include polyphagia and polyuria. Pertinent negatives for diabetes include no chest pain and no polydipsia. There are no hypoglycemic complications. Symptoms are improving. Diabetic complications include a CVA and nephropathy. Risk factors for coronary artery disease include diabetes mellitus, dyslipidemia, hypertension, obesity and sedentary lifestyle. Current diabetic treatment includes intensive insulin program. Compliance with diabetes treatment: She has history of noncompliance . Her weight is increasing steadily. She is following a generally unhealthy diet. When asked about meal planning, she reported none. She has not had a previous visit with a dietitian. She never (She has chronic back problem.) participates in exercise. Her home blood glucose trend is decreasing steadily. Her breakfast blood glucose range is generally 130-140 mg/dl. Her lunch blood glucose range is generally 180-200 mg/dl. Her dinner blood glucose range is generally 180-200 mg/dl. Her overall blood glucose range is 180-200 mg/dl. An ACE inhibitor/angiotensin II receptor blocker is being taken.  Hyperlipidemia  This is a chronic problem. The current  episode started more than 1 year ago. Exacerbating diseases include diabetes and obesity. Pertinent negatives include no chest pain, leg pain, myalgias or shortness of breath. Current antihyperlipidemic treatment includes statins. Compliance problems: She had history of noncompliance.  Risk factors for coronary artery disease include dyslipidemia, diabetes mellitus, hypertension,  obesity and a sedentary lifestyle.  Hypertension  This is a chronic problem. The current episode started more than 1 year ago. The problem is uncontrolled. Pertinent negatives include no chest pain, headaches, palpitations or shortness of breath. Risk factors for coronary artery disease include diabetes mellitus, dyslipidemia, obesity and sedentary lifestyle. Past treatments include angiotensin blockers. Hypertensive end-organ damage includes CVA.     Review of Systems  Constitutional: Negative for unexpected weight change.  HENT: Negative for trouble swallowing and voice change.   Eyes: Negative for visual disturbance.  Respiratory: Negative for cough, shortness of breath and wheezing.   Cardiovascular: Negative for chest pain, palpitations and leg swelling.  Gastrointestinal: Negative for diarrhea, nausea and vomiting.  Endocrine: Positive for polyphagia and polyuria. Negative for cold intolerance, heat intolerance and polydipsia.  Musculoskeletal: Negative for arthralgias and myalgias.  Skin: Negative for color change, pallor, rash and wound.  Neurological: Negative for seizures and headaches.  Psychiatric/Behavioral: Negative for confusion and suicidal ideas.    Objective:    BP (!) 168/92   Pulse 70   Ht 5\' 1"  (1.549 m)   Wt 192 lb (87.1 kg)   BMI 36.28 kg/m   Wt Readings from Last 3 Encounters:  06/11/16 192 lb (87.1 kg)  05/11/16 189 lb 12.8 oz (86.1 kg)  04/27/16 189 lb (85.7 kg)    Physical Exam  Constitutional: She is oriented to person, place, and time. She appears well-developed.  HENT:  Head: Normocephalic and atraumatic.  Eyes: EOM are normal.  Neck: Normal range of motion. Neck supple. No tracheal deviation present. No thyromegaly present.  Cardiovascular: Normal rate and regular rhythm.   Pulmonary/Chest: Effort normal and breath sounds normal.  Abdominal: Soft. Bowel sounds are normal. There is no tenderness. There is no guarding.  Musculoskeletal: Normal  range of motion. She exhibits no edema.  Neurological: She is alert and oriented to person, place, and time. She has normal reflexes. No cranial nerve deficit. Coordination normal.  Skin: Skin is warm and dry. No rash noted. No erythema. No pallor.  Psychiatric: She has a normal mood and affect. Judgment normal.    Results for orders placed or performed in visit on 03/31/16  Hemoglobin A1c  Result Value Ref Range   Hgb A1c MFr Bld 12.2 (H) <5.7 %   Mean Plasma Glucose 303 mg/dL  Basic Metabolic Panel  Result Value Ref Range   Sodium 136 135 - 146 mmol/L   Potassium 3.8 3.5 - 5.3 mmol/L   Chloride 99 98 - 110 mmol/L   CO2 29 20 - 31 mmol/L   Glucose, Bld 315 (H) 65 - 99 mg/dL   BUN 13 7 - 25 mg/dL   Creat 0.85 0.50 - 0.99 mg/dL   Calcium 9.3 8.6 - 10.4 mg/dL   Chemistry (most recent): Lab Results  Component Value Date   NA 136 03/31/2016   K 3.8 03/31/2016   CL 99 03/31/2016   CO2 29 03/31/2016   BUN 13 03/31/2016   CREATININE 0.85 03/31/2016   Diabetic Labs (most recent): Lab Results  Component Value Date   HGBA1C 12.2 (H) 03/31/2016   HGBA1C 9.5 (A) 09/17/2015   HGBA1C 10.8 (H) 04/15/2012  Lipid Panel     Component Value Date/Time   CHOL 208 (H) 05/02/2012 0945   TRIG 144.0 05/02/2012 0945   HDL 41.20 05/02/2012 0945   CHOLHDL 5 05/02/2012 0945   VLDL 28.8 05/02/2012 0945   LDLCALC 123 (H) 02/13/2011 0842   LDLDIRECT 135.5 05/02/2012 0945      Assessment & Plan:   1. Uncontrolled type 2 diabetes mellitus with other circulatory complication, with long-term current use of insulin ([place])   - her diabetes is  complicated by carotid stenosis and reversed mild CK D and patient remains at a high risk for more acute and chronic complications of diabetes which include CAD, CVA, CKD, retinopathy, and neuropathy. These are all discussed in detail with the patient.  Patient did  come with him better engagement and improved glycemic profile. Over the last 2 weeks her  average blood glucose dropped to 177. It is not time for her to repeat A1c at. Her most recent A1c was increasing to 12.5% from 9.5%.     Recent labs reviewed.   - I have re-counseled the patient on diet management and weight loss  by adopting a carbohydrate restricted / protein rich  Diet.  - Suggestion is made for patient to avoid simple carbohydrates   from their diet including Cakes , Desserts, Ice Cream,  Soda (  diet and regular) , Sweet Tea , Candies,  Chips, Cookies, Artificial Sweeteners,   and "Sugar-free" Products .  This will help patient to have stable blood glucose profile and potentially avoid unintended  Weight gain.  - Patient is advised to stick to a routine mealtimes to eat 3 meals  a day and avoid unnecessary snacks ( to snack only to correct hypoglycemia).  - The patient  will be  scheduled with Jearld Fenton, RDN, CDE for individualized DM education.  - I have approached patient with the following individualized plan to manage diabetes and patient agrees.  - Patient has been alarmingly noncompliant and easily disengages from self-care, she is showing a better engagement this time. - She came with better average blood glucose of 177 over the last 14 days.  - I encouraged her for what she is doing and give her insulin samples. - I will continue  Levemir at 50 units qhs, and NovoLog  10 units TIDAC for premeal BG readings of 90-150mg /dl, plus patient specific sliding scale insulin for correction of unexpected hyperglycemia above 150mg /dl, associated with strict monitoring of BG AC and HS.  -Adjustment parameters for hypo and hyperglycemia were given in a written document to patient. -Patient is encouraged to call clinic for blood glucose levels less than 70 or above 300 mg /dl. -Hold Invokana for now, she does not tolerate metformin. - If she cannot afford insulin analogs, she will be considered for Novolin 70/30 to use twice a day.  - Patient specific target  for A1c;  LDL, HDL, Triglycerides, and  Waist Circumference were discussed in detail.  2) BP/HTN: Uncontrolled. I urged her to resume and Continue current medications including ACEI/ARB. 3) Lipids/HPL:   advised her to continue Zocor 20 mg by mouth daily at bedtime. Marland Kitchen  4)  Weight/Diet:  CDE consult has been requested, exercise, and carbohydrates information provided.  5) Chronic Care/Health Maintenance:  -Patient  Is  on ACEI/ARB and Statin medications and encouraged to continue to follow up with Ophthalmology, Podiatrist at least yearly or according to recommendations, and advised to  stay away from smoking. I have recommended yearly flu  vaccine and pneumonia vaccination at least every 5 years; moderate intensity exercise for up to 150 minutes weekly; and  sleep for at least 7 hours a day.  - 25 minutes of time was spent on the care of this patient , 50% of which was applied for counseling on diabetes complications and their preventions.  - I advised patient to maintain close follow up with HAWKINS,EDWARD L, MD for primary care needs.  Patient is asked to bring meter and  blood glucose logs during their next visit.   Follow up plan: -Return in about 6 weeks (around 07/23/2016) for follow up with pre-visit labs, meter, and logs.  Glade Lloyd, MD Phone: [phone]  Fax: [phone]   06/11/2016, 9:20 AM

## 2016-06-11 NOTE — Patient Instructions (Signed)

## 2016-06-16 ENCOUNTER — Other Ambulatory Visit: Payer: Self-pay | Admitting: "Endocrinology

## 2016-06-17 ENCOUNTER — Other Ambulatory Visit: Payer: Self-pay | Admitting: "Endocrinology

## 2016-06-17 DIAGNOSIS — M9905 Segmental and somatic dysfunction of pelvic region: Secondary | ICD-10-CM | POA: Diagnosis not present

## 2016-06-17 DIAGNOSIS — M5441 Lumbago with sciatica, right side: Secondary | ICD-10-CM | POA: Diagnosis not present

## 2016-06-17 DIAGNOSIS — M9903 Segmental and somatic dysfunction of lumbar region: Secondary | ICD-10-CM | POA: Diagnosis not present

## 2016-06-17 DIAGNOSIS — M9902 Segmental and somatic dysfunction of thoracic region: Secondary | ICD-10-CM | POA: Diagnosis not present

## 2016-06-17 DIAGNOSIS — M4806 Spinal stenosis, lumbar region: Secondary | ICD-10-CM | POA: Diagnosis not present

## 2016-07-01 DIAGNOSIS — M9903 Segmental and somatic dysfunction of lumbar region: Secondary | ICD-10-CM | POA: Diagnosis not present

## 2016-07-01 DIAGNOSIS — M5441 Lumbago with sciatica, right side: Secondary | ICD-10-CM | POA: Diagnosis not present

## 2016-07-01 DIAGNOSIS — M9902 Segmental and somatic dysfunction of thoracic region: Secondary | ICD-10-CM | POA: Diagnosis not present

## 2016-07-01 DIAGNOSIS — M9905 Segmental and somatic dysfunction of pelvic region: Secondary | ICD-10-CM | POA: Diagnosis not present

## 2016-07-01 DIAGNOSIS — M4806 Spinal stenosis, lumbar region: Secondary | ICD-10-CM | POA: Diagnosis not present

## 2016-07-23 ENCOUNTER — Other Ambulatory Visit: Payer: Self-pay | Admitting: "Endocrinology

## 2016-07-23 DIAGNOSIS — E785 Hyperlipidemia, unspecified: Secondary | ICD-10-CM | POA: Diagnosis not present

## 2016-07-23 DIAGNOSIS — E1159 Type 2 diabetes mellitus with other circulatory complications: Secondary | ICD-10-CM | POA: Diagnosis not present

## 2016-07-23 LAB — LIPID PANEL
CHOL/HDL RATIO: 3.5 ratio (ref <=5.0)
Cholesterol: 137 mg/dL (ref 125–200)
HDL: 39 mg/dL — AB (ref >=46)
LDL CALC: 73 mg/dL (ref <130)
Triglycerides: 126 mg/dL (ref <150)
VLDL: 25 mg/dL (ref <30)

## 2016-07-23 LAB — COMPLETE METABOLIC PANEL WITH GFR
ALBUMIN: 3.7 g/dL (ref 3.6–5.1)
ALK PHOS: 42 U/L (ref 33–130)
ALT: 16 U/L (ref 6–29)
AST: 18 U/L (ref 10–35)
BILIRUBIN TOTAL: 0.6 mg/dL (ref 0.2–1.2)
BUN: 9 mg/dL (ref 7–25)
CO2: 26 mmol/L (ref 20–31)
CREATININE: 0.61 mg/dL (ref 0.50–0.99)
Calcium: 9 mg/dL (ref 8.6–10.4)
Chloride: 106 mmol/L (ref 98–110)
GFR, Est African American: >89 mL/min (ref >=60)
GFR, Est Non African American: >89 mL/min (ref >=60)
GLUCOSE: 154 mg/dL — AB (ref 65–99)
Potassium: 4.1 mmol/L (ref 3.5–5.3)
SODIUM: 140 mmol/L (ref 135–146)
TOTAL PROTEIN: 6.5 g/dL (ref 6.1–8.1)

## 2016-07-24 LAB — MICROALBUMIN / CREATININE URINE RATIO
Creatinine, Urine: 195 mg/dL (ref 20–320)
MICROALB/CREAT RATIO: 4 ug/mg{creat} (ref <30)
Microalb, Ur: 0.8 mg/dL

## 2016-07-24 LAB — HEMOGLOBIN A1C
Hgb A1c MFr Bld: 9.2 % — ABNORMAL HIGH
Mean Plasma Glucose: 217 mg/dL

## 2016-07-31 ENCOUNTER — Encounter: Payer: Self-pay | Admitting: "Endocrinology

## 2016-07-31 ENCOUNTER — Ambulatory Visit (INDEPENDENT_AMBULATORY_CARE_PROVIDER_SITE_OTHER): Payer: Medicare Other | Admitting: "Endocrinology

## 2016-07-31 VITALS — BP 137/84 | HR 66 | Ht 61.0 in | Wt 185.0 lb

## 2016-07-31 DIAGNOSIS — I10 Essential (primary) hypertension: Secondary | ICD-10-CM

## 2016-07-31 DIAGNOSIS — E782 Mixed hyperlipidemia: Secondary | ICD-10-CM

## 2016-07-31 DIAGNOSIS — I6523 Occlusion and stenosis of bilateral carotid arteries: Secondary | ICD-10-CM

## 2016-07-31 DIAGNOSIS — E1159 Type 2 diabetes mellitus with other circulatory complications: Secondary | ICD-10-CM

## 2016-07-31 DIAGNOSIS — E559 Vitamin D deficiency, unspecified: Secondary | ICD-10-CM | POA: Insufficient documentation

## 2016-07-31 MED ORDER — VITAMIN D3 125 MCG (5000 UT) PO CAPS: 5000.0000 [IU] | ORAL_CAPSULE | Freq: Every day | ORAL | 0 refills | Status: DC

## 2016-07-31 NOTE — Progress Notes (Signed)
Subjective:    Patient ID: Stacy [name], female    DOB: [birth year], PCP Alonza Bogus, MD   Past Medical History:  Diagnosis Date  . Chronic back pain   . Diabetes mellitus   . HTN (hypertension)   . Hyperlipidemia   . Osteoarthritis   . Stroke [facility])    Past Surgical History:  Procedure Laterality Date  . carpel tunnel release     bilateral  . CESAREAN SECTION    . COLONOSCOPY  2006   normal, Dr. Benson [place]  . ESOPHAGOGASTRODUODENOSCOPY  2006   normal, Dr. Benson [place]   Social History   Social History  . Marital status: Divorced    Spouse name: N/A  . Number of children: N/A  . Years of education: N/A   Occupational History  .  [employer]    out due to back injury   Social History Main Topics  . Smoking status: Never Smoker  . Smokeless tobacco: Never Used  . Alcohol use No  . Drug use: No  . Sexual activity: No   Other Topics Concern  . None   Social History Narrative  . None   Outpatient Encounter Prescriptions as of 07/31/2016  Medication Sig  . amLODipine (NORVASC) 5 MG tablet Take 5 mg by mouth daily.  Marland Kitchen atorvastatin (LIPITOR) 80 MG tablet Take 80 mg by mouth daily.  . B-D UF III MINI PEN NEEDLES 31G X 5 MM MISC AS DIRECTED DX:250.0  . baclofen (LIORESAL) 10 MG tablet Reported on 02/17/2016  . Cholecalciferol (VITAMIN D3) 5000 units CAPS Take 1 capsule (5,000 Units total) by mouth daily.  . clopidogrel (PLAVIX) 75 MG tablet Take 75 mg by mouth daily.  Marland Kitchen FREESTYLE TEST STRIPS test strip USE TO TEST BLOOD SUGAR FOUR TIMES DAILY AS DIRECTED  . insulin aspart (NOVOLOG FLEXPEN) 100 UNIT/ML FlexPen Inject 12-18 Units into the skin 3 (three) times daily with meals.  . Insulin Detemir (LEVEMIR FLEXTOUCH) 100 UNIT/ML Pen Inject 50 Units into the skin at bedtime.  Marland Kitchen losartan-hydrochlorothiazide (HYZAAR) 50-12.5 MG tablet Take 1 tablet by mouth daily.   No facility-administered encounter medications on file as of 07/31/2016.    ALLERGIES: No Known  Allergies VACCINATION STATUS: Immunization History  Administered Date(s) Administered  . Influenza Whole 07/13/2007, 08/07/2009  . Pneumococcal Polysaccharide-23 04/22/2012  . Td 01/11/2007    Diabetes  She presents for her follow-up diabetic visit. She has type 2 diabetes mellitus. Onset time: She was diagnosed at approximate age of 68 years. Her disease course has been improving. There are no hypoglycemic associated symptoms. Pertinent negatives for hypoglycemia include no confusion, headaches, pallor or seizures. Pertinent negatives for diabetes include no chest pain, no polydipsia, no polyphagia and no polyuria. There are no hypoglycemic complications. Symptoms are improving. Diabetic complications include a CVA and nephropathy. Risk factors for coronary artery disease include diabetes mellitus, dyslipidemia, hypertension, obesity and sedentary lifestyle. Current diabetic treatment includes intensive insulin program. Compliance with diabetes treatment: She has history of noncompliance . Her weight is increasing steadily. She is following a generally unhealthy diet. When asked about meal planning, she reported none. She has not had a previous visit with a dietitian. She never (She has chronic back problem.) participates in exercise. Her home blood glucose trend is decreasing steadily. Her breakfast blood glucose range is generally 130-140 mg/dl. Her lunch blood glucose range is generally 180-200 mg/dl. Her dinner blood glucose range is generally 180-200 mg/dl. Her overall blood glucose range is 180-200  mg/dl. An ACE inhibitor/angiotensin II receptor blocker is being taken.  Hyperlipidemia  This is a chronic problem. The current episode started more than 1 year ago. Exacerbating diseases include diabetes and obesity. Pertinent negatives include no chest pain, leg pain, myalgias or shortness of breath. Current antihyperlipidemic treatment includes statins. Compliance problems: She had history of  noncompliance.  Risk factors for coronary artery disease include dyslipidemia, diabetes mellitus, hypertension, obesity and a sedentary lifestyle.  Hypertension  This is a chronic problem. The current episode started more than 1 year ago. The problem is uncontrolled. Pertinent negatives include no chest pain, headaches, palpitations or shortness of breath. Risk factors for coronary artery disease include diabetes mellitus, dyslipidemia, obesity and sedentary lifestyle. Past treatments include angiotensin blockers. Hypertensive end-organ damage includes CVA.     Review of Systems  Constitutional: Negative for unexpected weight change.  HENT: Negative for trouble swallowing and voice change.   Eyes: Negative for visual disturbance.  Respiratory: Negative for cough, shortness of breath and wheezing.   Cardiovascular: Negative for chest pain, palpitations and leg swelling.  Gastrointestinal: Negative for diarrhea, nausea and vomiting.  Endocrine: Negative for cold intolerance, heat intolerance, polydipsia, polyphagia and polyuria.  Musculoskeletal: Negative for arthralgias and myalgias.  Skin: Negative for color change, pallor, rash and wound.  Neurological: Negative for seizures and headaches.  Psychiatric/Behavioral: Negative for confusion and suicidal ideas.    Objective:    BP 137/84   Pulse 66   Ht 5\' 1"  (1.549 m)   Wt 185 lb (83.9 kg)   BMI 34.96 kg/m   Wt Readings from Last 3 Encounters:  07/31/16 185 lb (83.9 kg)  06/11/16 192 lb (87.1 kg)  05/11/16 189 lb 12.8 oz (86.1 kg)    Physical Exam  Constitutional: She is oriented to person, place, and time. She appears well-developed.  HENT:  Head: Normocephalic and atraumatic.  Eyes: EOM are normal.  Neck: Normal range of motion. Neck supple. No tracheal deviation present. No thyromegaly present.  Cardiovascular: Normal rate and regular rhythm.   Pulmonary/Chest: Effort normal and breath sounds normal.  Abdominal: Soft. Bowel  sounds are normal. There is no tenderness. There is no guarding.  Musculoskeletal: Normal range of motion. She exhibits no edema.  Neurological: She is alert and oriented to person, place, and time. She has normal reflexes. No cranial nerve deficit. Coordination normal.  Skin: Skin is warm and dry. No rash noted. No erythema. No pallor.  Psychiatric: She has a normal mood and affect. Judgment normal.    Results for orders placed or performed in visit on 07/23/16  COMPLETE METABOLIC PANEL WITH GFR  Result Value Ref Range   Sodium 140 135 - 146 mmol/L   Potassium 4.1 3.5 - 5.3 mmol/L   Chloride 106 98 - 110 mmol/L   CO2 26 20 - 31 mmol/L   Glucose, Bld 154 (H) 65 - 99 mg/dL   BUN 9 7 - 25 mg/dL   Creat 0.61 0.50 - 0.99 mg/dL   Total Bilirubin 0.6 0.2 - 1.2 mg/dL   Alkaline Phosphatase 42 33 - 130 U/L   AST 18 10 - 35 U/L   ALT 16 6 - 29 U/L   Total Protein 6.5 6.1 - 8.1 g/dL   Albumin 3.7 3.6 - 5.1 g/dL   Calcium 9.0 8.6 - 10.4 mg/dL   GFR, Est African American >89 >=60 mL/min   GFR, Est Non African American >89 >=60 mL/min  Microalbumin / creatinine urine ratio  Result Value Ref  Range   Creatinine, Urine 195 20 - 320 mg/dL   Microalb, Ur 0.8 Not estab mg/dL   Microalb Creat Ratio 4 <30 mcg/mg creat  Lipid panel  Result Value Ref Range   Cholesterol 137 125 - 200 mg/dL   Triglycerides 126 <150 mg/dL   HDL 39 (L) >=46 mg/dL   Total CHOL/HDL Ratio 3.5 <=5.0 Ratio   VLDL 25 <30 mg/dL   LDL Cholesterol 73 <130 mg/dL  Hemoglobin A1c  Result Value Ref Range   Hgb A1c MFr Bld 9.2 (H) <5.7 %   Mean Plasma Glucose 217 mg/dL   Chemistry (most recent): Lab Results  Component Value Date   NA 140 07/23/2016   K 4.1 07/23/2016   CL 106 07/23/2016   CO2 26 07/23/2016   BUN 9 07/23/2016   CREATININE 0.61 07/23/2016   Diabetic Labs (most recent): Lab Results  Component Value Date   HGBA1C 9.2 (H) 07/23/2016   HGBA1C 12.2 (H) 03/31/2016   HGBA1C 9.5 (A) 09/17/2015   Lipid  Panel     Component Value Date/Time   CHOL 137 07/23/2016 0856   TRIG 126 07/23/2016 0856   HDL 39 (L) 07/23/2016 0856   CHOLHDL 3.5 07/23/2016 0856   VLDL 25 07/23/2016 0856   LDLCALC 73 07/23/2016 0856   LDLDIRECT 135.5 05/02/2012 0945      Assessment & Plan:   1. Uncontrolled type 2 diabetes mellitus with other circulatory complication, with long-term current use of insulin ([place])   - her diabetes is  complicated by carotid stenosis and reversed mild CK D and patient remains at a high risk for more acute and chronic complications of diabetes which include CAD, CVA, CKD, retinopathy, and neuropathy. These are all discussed in detail with the patient.  Patient did  come with Much better engagement and improved glycemic profile. Over the last 2 weeks her average blood glucose dropped to 148. Her A1c has improved to 9.2% from 12.5%.   Recent labs reviewed.   - I have re-counseled the patient on diet management and weight loss  by adopting a carbohydrate restricted / protein rich  Diet.  - Suggestion is made for patient to avoid simple carbohydrates   from their diet including Cakes , Desserts, Ice Cream,  Soda (  diet and regular) , Sweet Tea , Candies,  Chips, Cookies, Artificial Sweeteners,   and "Sugar-free" Products .  This will help patient to have stable blood glucose profile and potentially avoid unintended  Weight gain.  - Patient is advised to stick to a routine mealtimes to eat 3 meals  a day and avoid unnecessary snacks ( to snack only to correct hypoglycemia).  - The patient  will be  scheduled with Jearld Fenton, RDN, CDE for individualized DM education.  - I have approached patient with the following individualized plan to manage diabetes and patient agrees.  - Patient has been alarmingly noncompliant and easily disengages from self-care, she is now  showing a better engagement this time. - She came with better average blood glucose of 148 over the last 14 days.  -  I encouraged her for what she is doing and urged her to stay focused.  - I will continue  Levemir  50 units qhs, and NovoLog  10 units TIDAC for premeal BG readings of 90-150mg /dl, plus patient specific sliding scale insulin for correction of unexpected hyperglycemia above 150mg /dl, associated with strict monitoring of BG AC and HS.  -Adjustment parameters for hypo and hyperglycemia were  given in a written document to patient. -Patient is encouraged to call clinic for blood glucose levels less than 70 or above 300 mg /dl. -Hold Invokana for now, she does not tolerate metformin. - If she cannot afford insulin analogs, she will be considered for Novolin 70/30 to use twice a day.  - Patient specific target  for A1c; LDL, HDL, Triglycerides, and  Waist Circumference were discussed in detail.  2) BP/HTN: controlled. I urged her to resume and Continue current medications including ACEI/ARB. 3) Lipids/HPL:   advised her to continue Zocor 20 mg by mouth daily at bedtime. Marland Kitchen  4)  Weight/Diet:  CDE consult has been requested, exercise, and carbohydrates information provided.  5) Chronic Care/Health Maintenance:  -Patient  Is  on ACEI/ARB and Statin medications and encouraged to continue to follow up with Ophthalmology, Podiatrist at least yearly or according to recommendations, and advised to  stay away from smoking. I have recommended yearly flu vaccine and pneumonia vaccination at least every 5 years; moderate intensity exercise for up to 150 minutes weekly; and  sleep for at least 7 hours a day.  - 25 minutes of time was spent on the care of this patient , 50% of which was applied for counseling on diabetes complications and their preventions.  - I advised patient to maintain close follow up with HAWKINS,EDWARD L, MD for primary care needs.  Patient is asked to bring meter and  blood glucose logs during their next visit.   Follow up plan: -Return in about 3 months (around 10/31/2016) for follow up with  pre-visit labs, meter, and logs.  Glade Lloyd, MD Phone: [phone]  Fax: [phone]   07/31/2016, 12:54 PM

## 2016-07-31 NOTE — Patient Instructions (Signed)

## 2016-10-01 ENCOUNTER — Other Ambulatory Visit: Payer: Self-pay | Admitting: "Endocrinology

## 2016-11-03 ENCOUNTER — Ambulatory Visit: Payer: Medicare Other | Admitting: "Endocrinology

## 2016-12-14 DIAGNOSIS — E119 Type 2 diabetes mellitus without complications: Secondary | ICD-10-CM | POA: Diagnosis not present

## 2017-01-06 DIAGNOSIS — M9903 Segmental and somatic dysfunction of lumbar region: Secondary | ICD-10-CM | POA: Diagnosis not present

## 2017-01-06 DIAGNOSIS — M9902 Segmental and somatic dysfunction of thoracic region: Secondary | ICD-10-CM | POA: Diagnosis not present

## 2017-01-06 DIAGNOSIS — M545 Low back pain: Secondary | ICD-10-CM | POA: Diagnosis not present

## 2017-01-06 DIAGNOSIS — M9905 Segmental and somatic dysfunction of pelvic region: Secondary | ICD-10-CM | POA: Diagnosis not present

## 2017-01-13 DIAGNOSIS — M9903 Segmental and somatic dysfunction of lumbar region: Secondary | ICD-10-CM | POA: Diagnosis not present

## 2017-01-13 DIAGNOSIS — M9902 Segmental and somatic dysfunction of thoracic region: Secondary | ICD-10-CM | POA: Diagnosis not present

## 2017-01-13 DIAGNOSIS — M9905 Segmental and somatic dysfunction of pelvic region: Secondary | ICD-10-CM | POA: Diagnosis not present

## 2017-01-13 DIAGNOSIS — M5441 Lumbago with sciatica, right side: Secondary | ICD-10-CM | POA: Diagnosis not present

## 2017-02-17 ENCOUNTER — Encounter: Payer: Self-pay | Admitting: Family

## 2017-02-22 ENCOUNTER — Other Ambulatory Visit: Payer: Self-pay | Admitting: *Deleted

## 2017-02-22 DIAGNOSIS — I6523 Occlusion and stenosis of bilateral carotid arteries: Secondary | ICD-10-CM

## 2017-02-23 ENCOUNTER — Encounter (HOSPITAL_COMMUNITY): Payer: Self-pay

## 2017-02-23 ENCOUNTER — Ambulatory Visit: Payer: Medicare Other | Admitting: Family

## 2017-03-01 ENCOUNTER — Other Ambulatory Visit: Payer: Self-pay | Admitting: "Endocrinology

## 2017-03-01 DIAGNOSIS — E118 Type 2 diabetes mellitus with unspecified complications: Principal | ICD-10-CM

## 2017-03-01 DIAGNOSIS — E1165 Type 2 diabetes mellitus with hyperglycemia: Secondary | ICD-10-CM

## 2017-03-09 DIAGNOSIS — E118 Type 2 diabetes mellitus with unspecified complications: Secondary | ICD-10-CM | POA: Diagnosis not present

## 2017-03-09 DIAGNOSIS — E1165 Type 2 diabetes mellitus with hyperglycemia: Secondary | ICD-10-CM | POA: Diagnosis not present

## 2017-03-09 LAB — COMPREHENSIVE METABOLIC PANEL
ALT: 15 U/L (ref 6–29)
AST: 16 U/L (ref 10–35)
Albumin: 4 g/dL (ref 3.6–5.1)
Alkaline Phosphatase: 60 U/L (ref 33–130)
BILIRUBIN TOTAL: 0.7 mg/dL (ref 0.2–1.2)
BUN: 12 mg/dL (ref 7–25)
CHLORIDE: 102 mmol/L (ref 98–110)
CO2: 28 mmol/L (ref 20–31)
CREATININE: 0.71 mg/dL (ref 0.50–0.99)
Calcium: 9 mg/dL (ref 8.6–10.4)
GLUCOSE: 329 mg/dL — AB (ref 65–99)
Potassium: 3.8 mmol/L (ref 3.5–5.3)
SODIUM: 138 mmol/L (ref 135–146)
Total Protein: 6.8 g/dL (ref 6.1–8.1)

## 2017-03-10 LAB — HEMOGLOBIN A1C
HEMOGLOBIN A1C: 12.1 % — AB (ref <5.7)
MEAN PLASMA GLUCOSE: 301 mg/dL

## 2017-03-15 ENCOUNTER — Ambulatory Visit (INDEPENDENT_AMBULATORY_CARE_PROVIDER_SITE_OTHER): Payer: Medicare Other | Admitting: "Endocrinology

## 2017-03-15 ENCOUNTER — Encounter: Payer: Self-pay | Admitting: "Endocrinology

## 2017-03-15 VITALS — BP 165/77 | HR 64 | Ht 61.0 in | Wt 173.0 lb

## 2017-03-15 DIAGNOSIS — I10 Essential (primary) hypertension: Secondary | ICD-10-CM | POA: Diagnosis not present

## 2017-03-15 DIAGNOSIS — I6523 Occlusion and stenosis of bilateral carotid arteries: Secondary | ICD-10-CM | POA: Diagnosis not present

## 2017-03-15 DIAGNOSIS — E782 Mixed hyperlipidemia: Secondary | ICD-10-CM | POA: Diagnosis not present

## 2017-03-15 DIAGNOSIS — E1159 Type 2 diabetes mellitus with other circulatory complications: Secondary | ICD-10-CM | POA: Diagnosis not present

## 2017-03-15 MED ORDER — INSULIN NPH ISOPHANE & REGULAR (70-30) 100 UNIT/ML ~~LOC~~ SUSP: 25.0000 [IU] | Freq: Two times a day (BID) | SUBCUTANEOUS | 2 refills | Status: DC

## 2017-03-15 MED ORDER — "INSULIN SYRINGE 29G X 1/2"" 0.5 ML MISC": 3 refills | Status: DC

## 2017-03-15 MED ORDER — GLUCOSE BLOOD VI STRP: ORAL_STRIP | 2 refills | Status: DC

## 2017-03-15 NOTE — Progress Notes (Signed)
Subjective:    Patient ID: Stacy [name], female    DOB: May 25, 1956, PCP Sinda Du, MD   Past Medical History:  Diagnosis Date  . Chronic back pain   . Diabetes mellitus   . HTN (hypertension)   . Hyperlipidemia   . Osteoarthritis   . Stroke [facility])    Past Surgical History:  Procedure Laterality Date  . carpel tunnel release     bilateral  . CESAREAN SECTION    . COLONOSCOPY  2006   normal, Dr. Benson [place]  . ESOPHAGOGASTRODUODENOSCOPY  2006   normal, Dr. Benson [place]   Social History   Social History  . Marital status: Divorced    Spouse name: N/A  . Number of children: N/A  . Years of education: N/A   Occupational History  .  [employer]    out due to back injury   Social History Main Topics  . Smoking status: Never Smoker  . Smokeless tobacco: Never Used  . Alcohol use No  . Drug use: No  . Sexual activity: No   Other Topics Concern  . None   Social History Narrative  . None   Outpatient Encounter Prescriptions as of 03/15/2017  Medication Sig  . amLODipine (NORVASC) 5 MG tablet Take 5 mg by mouth daily.  Marland Kitchen atorvastatin (LIPITOR) 80 MG tablet Take 80 mg by mouth daily.  . B-D UF III MINI PEN NEEDLES 31G X 5 MM MISC AS DIRECTED DX:250.0  . baclofen (LIORESAL) 10 MG tablet Reported on 02/17/2016  . Cholecalciferol (VITAMIN D3) 5000 units CAPS Take 1 capsule (5,000 Units total) by mouth daily.  . clopidogrel (PLAVIX) 75 MG tablet Take 75 mg by mouth daily.  Marland Kitchen glucose blood (FREESTYLE TEST STRIPS) test strip USE TO TEST BLOOD SUGAR FOUR TIMES DAILY AS DIRECTED  . insulin NPH-regular Human (NOVOLIN 70/30) (70-30) 100 UNIT/ML injection Inject 25 Units into the skin 2 (two) times daily with a meal.  . INSULIN SYRINGE .5CC/29G 29G X 1/2" 0.5 ML MISC Use to inject insulin 2 times a day  . losartan-hydrochlorothiazide (HYZAAR) 50-12.5 MG tablet Take 1 tablet by mouth daily.  . [DISCONTINUED] FREESTYLE TEST STRIPS test strip USE TO TEST BLOOD SUGAR FOUR TIMES DAILY AS  DIRECTED  . [DISCONTINUED] insulin aspart (NOVOLOG FLEXPEN) 100 UNIT/ML FlexPen Inject 12-18 Units into the skin 3 (three) times daily with meals.  . [DISCONTINUED] LEVEMIR FLEXTOUCH 100 UNIT/ML Pen INJECT 50 UNITS INTO THE SKIN EVERY NIGHT AT BEDTIME   No facility-administered encounter medications on file as of 03/15/2017.    ALLERGIES: No Known Allergies VACCINATION STATUS: Immunization History  Administered Date(s) Administered  . Influenza Whole 07/13/2007, 08/07/2009  . Pneumococcal Polysaccharide-23 04/22/2012  . Td 01/11/2007    Diabetes  She presents for her follow-up diabetic visit. She has type 2 diabetes mellitus. Onset time: She was diagnosed at approximate age of 9 years. Her disease course has been worsening. There are no hypoglycemic associated symptoms. Pertinent negatives for hypoglycemia include no confusion, headaches, pallor or seizures. Pertinent negatives for diabetes include no chest pain, no polydipsia, no polyphagia and no polyuria. There are no hypoglycemic complications. Symptoms are worsening. Diabetic complications include a CVA and nephropathy. Risk factors for coronary artery disease include diabetes mellitus, dyslipidemia, hypertension, obesity and sedentary lifestyle. Current diabetic treatments: She expresses concern about cost of medications. She ran out of her basal insulin 2 months ago and her bolus insulin few days  ago. Compliance with diabetes treatment: She has  history of noncompliance . Her weight is decreasing steadily. She is following a generally unhealthy diet. When asked about meal planning, she reported none. She has not had a previous visit with a dietitian. She never (She has chronic back problem.) participates in exercise. (She came with no meter nor logs to review today. Her A1c has increased to 12.1% indicating loss of control.) An ACE inhibitor/angiotensin II receptor blocker is being taken.  Hyperlipidemia  This is a chronic problem. The  current episode started more than 1 year ago. Exacerbating diseases include diabetes and obesity. Pertinent negatives include no chest pain, leg pain, myalgias or shortness of breath. Current antihyperlipidemic treatment includes statins. Compliance problems: She had history of noncompliance.  Risk factors for coronary artery disease include dyslipidemia, diabetes mellitus, hypertension, obesity and a sedentary lifestyle.  Hypertension  This is a chronic problem. The current episode started more than 1 year ago. The problem is uncontrolled. Pertinent negatives include no chest pain, headaches, palpitations or shortness of breath. Risk factors for coronary artery disease include diabetes mellitus, dyslipidemia, obesity and sedentary lifestyle. Past treatments include angiotensin blockers. Hypertensive end-organ damage includes CVA.     Review of Systems  Constitutional: Negative for unexpected weight change.  HENT: Negative for trouble swallowing and voice change.   Eyes: Negative for visual disturbance.  Respiratory: Negative for cough, shortness of breath and wheezing.   Cardiovascular: Negative for chest pain, palpitations and leg swelling.  Gastrointestinal: Negative for diarrhea, nausea and vomiting.  Endocrine: Negative for cold intolerance, heat intolerance, polydipsia, polyphagia and polyuria.  Musculoskeletal: Negative for arthralgias and myalgias.  Skin: Negative for color change, pallor, rash and wound.  Neurological: Negative for seizures and headaches.  Psychiatric/Behavioral: Negative for confusion and suicidal ideas.    Objective:    BP (!) 165/77   Pulse 64   Ht 5\' 1"  (1.549 m)   Wt 173 lb (78.5 kg)   BMI 32.69 kg/m   Wt Readings from Last 3 Encounters:  03/15/17 173 lb (78.5 kg)  07/31/16 185 lb (83.9 kg)  06/11/16 192 lb (87.1 kg)    Physical Exam  Constitutional: She is oriented to person, place, and time. She appears well-developed.  HENT:  Head: Normocephalic  and atraumatic.  Eyes: EOM are normal.  Neck: Normal range of motion. Neck supple. No tracheal deviation present. No thyromegaly present.  Cardiovascular: Normal rate and regular rhythm.   Pulmonary/Chest: Effort normal and breath sounds normal.  Abdominal: Soft. Bowel sounds are normal. There is no tenderness. There is no guarding.  Musculoskeletal: Normal range of motion. She exhibits no edema.  Neurological: She is alert and oriented to person, place, and time. She has normal reflexes. No cranial nerve deficit. Coordination normal.  Skin: Skin is warm and dry. No rash noted. No erythema. No pallor.  Psychiatric: She has a normal mood and affect. Judgment normal.    Results for orders placed or performed in visit on 03/01/17  Hemoglobin A1c  Result Value Ref Range   Hgb A1c MFr Bld 12.1 (H) <5.7 %   Mean Plasma Glucose 301 mg/dL  Comprehensive metabolic panel  Result Value Ref Range   Sodium 138 135 - 146 mmol/L   Potassium 3.8 3.5 - 5.3 mmol/L   Chloride 102 98 - 110 mmol/L   CO2 28 20 - 31 mmol/L   Glucose, Bld 329 (H) 65 - 99 mg/dL   BUN 12 7 - 25 mg/dL   Creat 0.71 0.50 - 0.99 mg/dL   Total  Bilirubin 0.7 0.2 - 1.2 mg/dL   Alkaline Phosphatase 60 33 - 130 U/L   AST 16 10 - 35 U/L   ALT 15 6 - 29 U/L   Total Protein 6.8 6.1 - 8.1 g/dL   Albumin 4.0 3.6 - 5.1 g/dL   Calcium 9.0 8.6 - 10.4 mg/dL   Chemistry (most recent): Lab Results  Component Value Date   NA 138 03/09/2017   K 3.8 03/09/2017   CL 102 03/09/2017   CO2 28 03/09/2017   BUN 12 03/09/2017   CREATININE 0.71 03/09/2017   Diabetic Labs (most recent): Lab Results  Component Value Date   HGBA1C 12.1 (H) 03/09/2017   HGBA1C 9.2 (H) 07/23/2016   HGBA1C 12.2 (H) 03/31/2016   Lipid Panel     Component Value Date/Time   CHOL 137 07/23/2016 0856   TRIG 126 07/23/2016 0856   HDL 39 (L) 07/23/2016 0856   CHOLHDL 3.5 07/23/2016 0856   VLDL 25 07/23/2016 0856   LDLCALC 73 07/23/2016 0856   LDLDIRECT 135.5  05/02/2012 0945      Assessment & Plan:   1. Uncontrolled type 2 diabetes mellitus with other circulatory complication, with long-term current use of insulin ([place])   - her diabetes is  complicated by carotid stenosis and reversed mild CK D and patient remains at a high risk for more acute and chronic complications of diabetes which include CAD, CVA, CKD, retinopathy, and neuropathy. These are all discussed in detail with the patient.  Patient did  come with Loss of control, with no meter nor logs. Her A1c has  Increased to 12.1% from 9.2%.   Recent labs reviewed.   - I have re-counseled the patient on diet management and weight loss  by adopting a carbohydrate restricted / protein rich  Diet.  - Suggestion is made for patient to avoid simple carbohydrates   from her diet including Cakes , Desserts, Ice Cream,  Soda (  diet and regular) , Sweet Tea , Candies,  Chips, Cookies, Artificial Sweeteners,   and "Sugar-free" Products .  This will help patient to have stable blood glucose profile and potentially avoid unintended  Weight gain.  - Patient is advised to stick to a routine mealtimes to eat 3 meals  a day and avoid unnecessary snacks ( to snack only to correct hypoglycemia).  - The patient  will be  scheduled with Jearld Fenton, RDN, CDE for individualized DM education.  - I have approached patient with the following individualized plan to manage diabetes and patient agrees.  - Patient has been alarmingly noncompliant and easily disengages from self-care, she is now  having trouble affording her medications including her insulin. -  I have discussed in switched her insulin to Novolin 70/30, 25 units with breakfast and 25 with supper for pre-meal blood glucose above 90 mg/dL, associated with strict monitoring of blood glucose 4 times a day-before meals and at bedtime.   -Adjustment parameters for hypo and hyperglycemia were given in a written document to patient. -Patient is  encouraged to call clinic for blood glucose levels less than 70 or above 300 mg /dl. -Hold Invokana for now, she does not tolerate metformin.   - Patient specific target  for A1c; LDL, HDL, Triglycerides, and  Waist Circumference were discussed in detail.  2) BP/HTN: uncontrolled. I urged her to resume and Continue current medications including ACEI/ARB. 3) Lipids/HPL:   advised her to continue Zocor 20 mg by mouth daily at bedtime. Marland Kitchen  4)  Weight/Diet:  CDE consult has been requested, exercise, and carbohydrates information provided.  5) Chronic Care/Health Maintenance:  -Patient  Is  on ACEI/ARB and Statin medications and encouraged to continue to follow up with Ophthalmology, Podiatrist at least yearly or according to recommendations, and advised to  stay away from smoking. I have recommended yearly flu vaccine and pneumonia vaccination at least every 5 years; moderate intensity exercise for up to 150 minutes weekly; and  sleep for at least 7 hours a day.  - 25 minutes of time was spent on the care of this patient , 50% of which was applied for counseling on diabetes complications and their preventions.  - I advised patient to maintain close follow up with Sinda Du, MD for primary care needs.  Patient is asked to bring meter and  blood glucose logs during her next visit.   Follow up plan: -Return in about 3 months (around 06/15/2017) for meter, and logs.  Glade Lloyd, MD Phone: [phone]  Fax: [phone]   03/15/2017, 4:39 PM

## 2017-04-27 DIAGNOSIS — M9902 Segmental and somatic dysfunction of thoracic region: Secondary | ICD-10-CM | POA: Diagnosis not present

## 2017-04-27 DIAGNOSIS — M545 Low back pain: Secondary | ICD-10-CM | POA: Diagnosis not present

## 2017-04-27 DIAGNOSIS — M9903 Segmental and somatic dysfunction of lumbar region: Secondary | ICD-10-CM | POA: Diagnosis not present

## 2017-04-27 DIAGNOSIS — M9905 Segmental and somatic dysfunction of pelvic region: Secondary | ICD-10-CM | POA: Diagnosis not present

## 2017-05-05 DIAGNOSIS — M545 Low back pain: Secondary | ICD-10-CM | POA: Diagnosis not present

## 2017-05-05 DIAGNOSIS — M9903 Segmental and somatic dysfunction of lumbar region: Secondary | ICD-10-CM | POA: Diagnosis not present

## 2017-05-05 DIAGNOSIS — M9905 Segmental and somatic dysfunction of pelvic region: Secondary | ICD-10-CM | POA: Diagnosis not present

## 2017-05-05 DIAGNOSIS — M9902 Segmental and somatic dysfunction of thoracic region: Secondary | ICD-10-CM | POA: Diagnosis not present

## 2017-06-08 ENCOUNTER — Other Ambulatory Visit: Payer: Self-pay | Admitting: "Endocrinology

## 2017-06-08 DIAGNOSIS — E1159 Type 2 diabetes mellitus with other circulatory complications: Secondary | ICD-10-CM | POA: Diagnosis not present

## 2017-06-08 LAB — RENAL FUNCTION PANEL
Albumin: 3.9 g/dL (ref 3.6–5.1)
BUN: 14 mg/dL (ref 7–25)
CALCIUM: 8.9 mg/dL (ref 8.6–10.4)
CO2: 28 mmol/L (ref 20–32)
CREATININE: 0.73 mg/dL (ref 0.50–0.99)
Chloride: 103 mmol/L (ref 98–110)
GLUCOSE: 230 mg/dL — AB (ref 65–99)
POTASSIUM: 4 mmol/L (ref 3.5–5.3)
Phosphorus: 3.9 mg/dL (ref 2.5–4.5)
Sodium: 138 mmol/L (ref 135–146)

## 2017-06-09 LAB — HEMOGLOBIN A1C
HEMOGLOBIN A1C: 10 % — AB (ref <5.7)
Mean Plasma Glucose: 240 mg/dL

## 2017-06-15 ENCOUNTER — Encounter: Payer: Self-pay | Admitting: "Endocrinology

## 2017-06-15 ENCOUNTER — Ambulatory Visit (INDEPENDENT_AMBULATORY_CARE_PROVIDER_SITE_OTHER): Payer: Medicare Other | Admitting: "Endocrinology

## 2017-06-15 VITALS — BP 161/80 | HR 68 | Ht 61.0 in | Wt 184.0 lb

## 2017-06-15 DIAGNOSIS — I6523 Occlusion and stenosis of bilateral carotid arteries: Secondary | ICD-10-CM

## 2017-06-15 DIAGNOSIS — E782 Mixed hyperlipidemia: Secondary | ICD-10-CM

## 2017-06-15 DIAGNOSIS — I10 Essential (primary) hypertension: Secondary | ICD-10-CM

## 2017-06-15 DIAGNOSIS — E1159 Type 2 diabetes mellitus with other circulatory complications: Secondary | ICD-10-CM | POA: Diagnosis not present

## 2017-06-15 MED ORDER — FREESTYLE LIBRE READER DEVI: 1.0000 | Freq: Once | 0 refills | Status: AC

## 2017-06-15 MED ORDER — FREESTYLE LIBRE SENSOR SYSTEM MISC: 2 refills | Status: DC

## 2017-06-15 MED ORDER — INSULIN NPH ISOPHANE & REGULAR (70-30) 100 UNIT/ML ~~LOC~~ SUSP: SUBCUTANEOUS | 2 refills | Status: DC

## 2017-06-15 NOTE — Patient Instructions (Signed)

## 2017-06-15 NOTE — Progress Notes (Signed)
Subjective:    Patient ID: Stacy [name], female    DOB: 04-09-1956, PCP Sinda Du, MD   Past Medical History:  Diagnosis Date  . Chronic back pain   . Diabetes mellitus   . HTN (hypertension)   . Hyperlipidemia   . Osteoarthritis   . Stroke [facility])    Past Surgical History:  Procedure Laterality Date  . carpel tunnel release     bilateral  . CESAREAN SECTION    . COLONOSCOPY  2006   normal, Dr. Benson [place]  . ESOPHAGOGASTRODUODENOSCOPY  2006   normal, Dr. Benson [place]   Social History   Social History  . Marital status: Divorced    Spouse name: N/A  . Number of children: N/A  . Years of education: N/A   Occupational History  .  [employer]    out due to back injury   Social History Main Topics  . Smoking status: Never Smoker  . Smokeless tobacco: Never Used  . Alcohol use No  . Drug use: No  . Sexual activity: No   Other Topics Concern  . None   Social History Narrative  . None   Outpatient Encounter Prescriptions as of 06/15/2017  Medication Sig  . amLODipine (NORVASC) 5 MG tablet Take 5 mg by mouth daily.  Marland Kitchen atorvastatin (LIPITOR) 80 MG tablet Take 80 mg by mouth daily.  . B-D UF III MINI PEN NEEDLES 31G X 5 MM MISC AS DIRECTED DX:250.0  . baclofen (LIORESAL) 10 MG tablet Reported on 02/17/2016  . Cholecalciferol (VITAMIN D3) 5000 units CAPS Take 1 capsule (5,000 Units total) by mouth daily.  . clopidogrel (PLAVIX) 75 MG tablet Take 75 mg by mouth daily.  . Continuous Blood Gluc Receiver (FREESTYLE LIBRE READER) DEVI 1 Piece by Does not apply route once.  . Continuous Blood Gluc Sensor (FREESTYLE LIBRE SENSOR SYSTEM) MISC Use one sensor every 10 days.  Marland Kitchen glucose blood (FREESTYLE TEST STRIPS) test strip USE TO TEST BLOOD SUGAR FOUR TIMES DAILY AS DIRECTED  . insulin NPH-regular Human (NOVOLIN 70/30) (70-30) 100 UNIT/ML injection Inject 25 units with breakfast and 30 units with supper when blood glucose readings are above 90 mg/dL.  . INSULIN SYRINGE .5CC/29G  29G X 1/2" 0.5 ML MISC Use to inject insulin 2 times a day  . losartan-hydrochlorothiazide (HYZAAR) 50-12.5 MG tablet Take 1 tablet by mouth daily.  . [DISCONTINUED] insulin NPH-regular Human (NOVOLIN 70/30) (70-30) 100 UNIT/ML injection Inject 25 Units into the skin 2 (two) times daily with a meal.   No facility-administered encounter medications on file as of 06/15/2017.    ALLERGIES: No Known Allergies VACCINATION STATUS: Immunization History  Administered Date(s) Administered  . Influenza Whole 07/13/2007, 08/07/2009  . Pneumococcal Polysaccharide-23 04/22/2012  . Td 01/11/2007    Diabetes  She presents for her follow-up diabetic visit. She has type 2 diabetes mellitus. Onset time: She was diagnosed at approximate age of 26 years. Her disease course has been improving. There are no hypoglycemic associated symptoms. Pertinent negatives for hypoglycemia include no confusion, headaches, pallor or seizures. Pertinent negatives for diabetes include no chest pain, no polydipsia, no polyphagia and no polyuria. There are no hypoglycemic complications. Symptoms are improving. Diabetic complications include a CVA and nephropathy. Risk factors for coronary artery disease include diabetes mellitus, dyslipidemia, hypertension, obesity and sedentary lifestyle. Compliance with diabetes treatment: She has history of noncompliance . Her weight is increasing steadily. She is following a generally unhealthy diet. When asked about meal planning,  she reported none. She has not had a previous visit with a dietitian. She never (She has chronic back problem.) participates in exercise. Her breakfast blood glucose range is generally >200 mg/dl. Her lunch blood glucose range is generally 180-200 mg/dl. Her dinner blood glucose range is generally 180-200 mg/dl. Her overall blood glucose range is 180-200 mg/dl. An ACE inhibitor/angiotensin II receptor blocker is being taken.  Hyperlipidemia  This is a chronic problem. The  current episode started more than 1 year ago. Exacerbating diseases include diabetes and obesity. Pertinent negatives include no chest pain, leg pain, myalgias or shortness of breath. Current antihyperlipidemic treatment includes statins. Compliance problems: She had history of noncompliance.  Risk factors for coronary artery disease include dyslipidemia, diabetes mellitus, hypertension, obesity and a sedentary lifestyle.  Hypertension  This is a chronic problem. The current episode started more than 1 year ago. The problem is uncontrolled. Pertinent negatives include no chest pain, headaches, palpitations or shortness of breath. Risk factors for coronary artery disease include diabetes mellitus, dyslipidemia, obesity and sedentary lifestyle. Past treatments include angiotensin blockers. Hypertensive end-organ damage includes CVA.    Review of Systems  Constitutional: Negative for unexpected weight change.  HENT: Negative for trouble swallowing and voice change.   Eyes: Negative for visual disturbance.  Respiratory: Negative for cough, shortness of breath and wheezing.   Cardiovascular: Negative for chest pain, palpitations and leg swelling.  Gastrointestinal: Negative for diarrhea, nausea and vomiting.  Endocrine: Negative for cold intolerance, heat intolerance, polydipsia, polyphagia and polyuria.  Musculoskeletal: Negative for arthralgias and myalgias.  Skin: Negative for color change, pallor, rash and wound.  Neurological: Negative for seizures and headaches.  Psychiatric/Behavioral: Negative for confusion and suicidal ideas.    Objective:    BP (!) 161/80   Pulse 68   Ht 5\' 1"  (1.549 m)   Wt 184 lb (83.5 kg)   BMI 34.77 kg/m   Wt Readings from Last 3 Encounters:  06/15/17 184 lb (83.5 kg)  03/15/17 173 lb (78.5 kg)  07/31/16 185 lb (83.9 kg)    Physical Exam  Constitutional: She is oriented to person, place, and time. She appears well-developed.  HENT:  Head: Normocephalic and  atraumatic.  Eyes: EOM are normal.  Neck: Normal range of motion. Neck supple. No tracheal deviation present. No thyromegaly present.  Cardiovascular: Normal rate and regular rhythm.   Pulmonary/Chest: Effort normal and breath sounds normal.  Abdominal: Soft. Bowel sounds are normal. There is no tenderness. There is no guarding.  Musculoskeletal: Normal range of motion. She exhibits no edema.  Neurological: She is alert and oriented to person, place, and time. She has normal reflexes. No cranial nerve deficit. Coordination normal.  Skin: Skin is warm and dry. No rash noted. No erythema. No pallor.  Psychiatric: She has a normal mood and affect. Judgment normal.    Results for orders placed or performed in visit on 06/08/17  Renal function panel  Result Value Ref Range   Sodium 138 135 - 146 mmol/L   Potassium 4.0 3.5 - 5.3 mmol/L   Chloride 103 98 - 110 mmol/L   CO2 28 20 - 32 mmol/L   Glucose, Bld 230 (H) 65 - 99 mg/dL   BUN 14 7 - 25 mg/dL   Creat 0.73 0.50 - 0.99 mg/dL   Albumin 3.9 3.6 - 5.1 g/dL   Calcium 8.9 8.6 - 10.4 mg/dL   Phosphorus 3.9 2.5 - 4.5 mg/dL  Hemoglobin A1c  Result Value Ref Range   Hgb  A1c MFr Bld 10.0 (H) <5.7 %   Mean Plasma Glucose 240 mg/dL   Chemistry (most recent): Lab Results  Component Value Date   NA 138 06/08/2017   K 4.0 06/08/2017   CL 103 06/08/2017   CO2 28 06/08/2017   BUN 14 06/08/2017   CREATININE 0.73 06/08/2017   Diabetic Labs (most recent): Lab Results  Component Value Date   HGBA1C 10.0 (H) 06/08/2017   HGBA1C 12.1 (H) 03/09/2017   HGBA1C 9.2 (H) 07/23/2016   Lipid Panel     Component Value Date/Time   CHOL 137 07/23/2016 0856   TRIG 126 07/23/2016 0856   HDL 39 (L) 07/23/2016 0856   CHOLHDL 3.5 07/23/2016 0856   VLDL 25 07/23/2016 0856   LDLCALC 73 07/23/2016 0856   LDLDIRECT 135.5 05/02/2012 0945      Assessment & Plan:   1. Uncontrolled type 2 diabetes mellitus with other circulatory complication, with  long-term current use of insulin ([place])   - her diabetes is  complicated by carotid stenosis and reversed mild CK D and patient remains at a high risk for more acute and chronic complications of diabetes which include CAD, CVA, CKD, retinopathy, and neuropathy. These are all discussed in detail with the patient.  Patient did  come with better blood glucose readings. Her A1c has  improved to 10% from  12.1%.  Recent labs reviewed.   - I have re-counseled the patient on diet management and weight loss  by adopting a carbohydrate restricted / protein rich  Diet.  -Suggestion is made for her to avoid simple carbohydrates  from her diet including Cakes, Sweet Desserts, Ice Cream, Soda (diet and regular), Sweet Tea, Candies, Chips, Cookies, Store Bought Juices, Alcohol in Excess of  1-2 drinks a day, Artificial Sweeteners, and "Sugar-free" Products. This will help patient to have stable blood glucose profile and potentially avoid unintended weight gain.   - Patient is advised to stick to a routine mealtimes to eat 3 meals  a day and avoid unnecessary snacks ( to snack only to correct hypoglycemia).  - I have approached patient with the following individualized plan to manage diabetes and patient agrees.  - Patient has been alarmingly noncompliant and easily disengages from self-care, she is now  having trouble affording her medications including her insulin. - She did better with premixed and cheaper insulin that insulin analogs on MDI.  -   I will continue with Novolin 70/30, 25 units with breakfast and increased to 30 units with supper for pre-meal blood glucose above 90 mg/dL, associated with strict monitoring of blood glucose 4 times a day-before meals and at bedtime. - She would benefit from continuous glucose monitoring, I have discussed in initiated a prescription for the FreeStyle libre device . -Adjustment parameters for hypo and hyperglycemia were given in a written document to  patient. -Patient is encouraged to call clinic for blood glucose levels less than 70 or above 300 mg /dl. - she does not tolerate metformin.   - Patient specific target  for A1c; LDL, HDL, Triglycerides, and  Waist Circumference were discussed in detail.  2) BP/HTN: uncontrolled.  she has not been consistent in taking her blood pressure medications. I urged her to resume and Continue current medications including ACEI/ARB. 3) Lipids/HPL:   advised her to continue Zocor 20 mg by mouth daily at bedtime. Marland Kitchen  4)  Weight/Diet:  CDE consult has been requested, exercise, and carbohydrates information provided.  5) Chronic Care/Health Maintenance:  -Patient  Is  on ACEI/ARB and Statin medications and encouraged to continue to follow up with Ophthalmology, Podiatrist at least yearly or according to recommendations, and advised to  stay away from smoking. I have recommended yearly flu vaccine and pneumonia vaccination at least every 5 years; moderate intensity exercise for up to 150 minutes weekly; and  sleep for at least 7 hours a day.  -  Time spent with the patient: 25 min, of which >50% was spent in reviewing her sugar logs , discussing her hypo- and hyper-glycemic episodes, reviewing her current and  previous labs and insulin doses and developing a plan to avoid hypo- and hyper-glycemia.    - I advised patient to maintain close follow up with Sinda Du, MD for primary care needs.  Patient is asked to bring meter and  blood glucose logs during her next visit.   Follow up plan: -Return in about 3 months (around 09/14/2017) for follow up with pre-visit labs, meter, and logs.  Glade Lloyd, MD Phone: [phone]  Fax: [phone]  This note was partially dictated with voice recognition software. Similar sounding words can be transcribed inadequately or may not  be corrected upon review.  06/15/2017, 1:53 PM

## 2017-07-28 ENCOUNTER — Other Ambulatory Visit: Payer: Self-pay | Admitting: "Endocrinology

## 2017-07-28 DIAGNOSIS — M9905 Segmental and somatic dysfunction of pelvic region: Secondary | ICD-10-CM | POA: Diagnosis not present

## 2017-07-28 DIAGNOSIS — M9902 Segmental and somatic dysfunction of thoracic region: Secondary | ICD-10-CM | POA: Diagnosis not present

## 2017-07-28 DIAGNOSIS — M545 Low back pain: Secondary | ICD-10-CM | POA: Diagnosis not present

## 2017-07-28 DIAGNOSIS — Z23 Encounter for immunization: Secondary | ICD-10-CM | POA: Diagnosis not present

## 2017-07-28 DIAGNOSIS — M9903 Segmental and somatic dysfunction of lumbar region: Secondary | ICD-10-CM | POA: Diagnosis not present

## 2017-09-13 ENCOUNTER — Other Ambulatory Visit: Payer: Self-pay | Admitting: "Endocrinology

## 2017-09-15 ENCOUNTER — Other Ambulatory Visit: Payer: Self-pay

## 2017-09-15 ENCOUNTER — Ambulatory Visit: Payer: Medicare Other | Admitting: "Endocrinology

## 2017-09-15 MED ORDER — GLUCOSE BLOOD VI STRP: ORAL_STRIP | 5 refills | Status: DC

## 2017-09-30 DIAGNOSIS — E782 Mixed hyperlipidemia: Secondary | ICD-10-CM | POA: Diagnosis not present

## 2017-09-30 DIAGNOSIS — E1159 Type 2 diabetes mellitus with other circulatory complications: Secondary | ICD-10-CM | POA: Diagnosis not present

## 2017-10-01 LAB — RENAL FUNCTION PANEL
ALBUMIN MSPROF: 4 g/dL (ref 3.6–5.1)
BUN: 11 mg/dL (ref 7–25)
CALCIUM: 9.6 mg/dL (ref 8.6–10.4)
CO2: 31 mmol/L (ref 20–32)
Chloride: 101 mmol/L (ref 98–110)
Creat: 0.75 mg/dL (ref 0.50–0.99)
GLUCOSE: 159 mg/dL — AB (ref 65–99)
PHOSPHORUS: 4.5 mg/dL (ref 2.5–4.5)
Potassium: 4 mmol/L (ref 3.5–5.3)
Sodium: 138 mmol/L (ref 135–146)

## 2017-10-01 LAB — LIPID PANEL
CHOLESTEROL: 213 mg/dL — AB (ref <200)
HDL: 44 mg/dL — AB (ref >50)
LDL CHOLESTEROL (CALC): 148 mg/dL — AB
Non-HDL Cholesterol (Calc): 169 mg/dL (calc) — ABNORMAL HIGH (ref <130)
TRIGLYCERIDES: 99 mg/dL (ref <150)
Total CHOL/HDL Ratio: 4.8 (calc) (ref <5.0)

## 2017-10-01 LAB — T4, FREE: Free T4: 1.1 ng/dL (ref 0.8–1.8)

## 2017-10-01 LAB — HEMOGLOBIN A1C
HEMOGLOBIN A1C: 11 %{Hb} — AB (ref <5.7)
MEAN PLASMA GLUCOSE: 269 (calc)
eAG (mmol/L): 14.9 (calc)

## 2017-10-01 LAB — TSH: TSH: 1.35 mIU/L (ref 0.40–4.50)

## 2017-10-04 DIAGNOSIS — M545 Low back pain: Secondary | ICD-10-CM | POA: Diagnosis not present

## 2017-10-04 DIAGNOSIS — E1165 Type 2 diabetes mellitus with hyperglycemia: Secondary | ICD-10-CM | POA: Diagnosis not present

## 2017-10-04 DIAGNOSIS — I10 Essential (primary) hypertension: Secondary | ICD-10-CM | POA: Diagnosis not present

## 2017-10-04 DIAGNOSIS — E785 Hyperlipidemia, unspecified: Secondary | ICD-10-CM | POA: Diagnosis not present

## 2017-10-08 ENCOUNTER — Ambulatory Visit (INDEPENDENT_AMBULATORY_CARE_PROVIDER_SITE_OTHER): Payer: Medicare Other | Admitting: "Endocrinology

## 2017-10-08 ENCOUNTER — Encounter: Payer: Self-pay | Admitting: "Endocrinology

## 2017-10-08 VITALS — BP 162/82 | HR 71 | Ht 61.0 in | Wt 184.0 lb

## 2017-10-08 DIAGNOSIS — I6523 Occlusion and stenosis of bilateral carotid arteries: Secondary | ICD-10-CM | POA: Diagnosis not present

## 2017-10-08 DIAGNOSIS — E559 Vitamin D deficiency, unspecified: Secondary | ICD-10-CM | POA: Diagnosis not present

## 2017-10-08 DIAGNOSIS — E782 Mixed hyperlipidemia: Secondary | ICD-10-CM

## 2017-10-08 DIAGNOSIS — I10 Essential (primary) hypertension: Secondary | ICD-10-CM | POA: Diagnosis not present

## 2017-10-08 DIAGNOSIS — E1159 Type 2 diabetes mellitus with other circulatory complications: Secondary | ICD-10-CM

## 2017-10-08 MED ORDER — INSULIN NPH ISOPHANE & REGULAR (70-30) 100 UNIT/ML ~~LOC~~ SUSP: SUBCUTANEOUS | 2 refills | Status: DC

## 2017-10-08 NOTE — Progress Notes (Signed)
Subjective:    Patient ID: Stacy [name], female    DOB: 1955/10/20, PCP Sinda Du, MD   Past Medical History:  Diagnosis Date  . Chronic back pain   . Diabetes mellitus   . HTN (hypertension)   . Hyperlipidemia   . Osteoarthritis   . Stroke [facility])    Past Surgical History:  Procedure Laterality Date  . carpel tunnel release     bilateral  . CESAREAN SECTION    . COLONOSCOPY  2006   normal, Dr. Benson [place]  . ESOPHAGOGASTRODUODENOSCOPY  2006   normal, Dr. Benson Norway   Social History   Socioeconomic History  . Marital status: Divorced    Spouse name: None  . Number of children: None  . Years of education: None  . Highest education level: None  Social Needs  . Financial resource strain: None  . Food insecurity - worry: None  . Food insecurity - inability: None  . Transportation needs - medical: None  . Transportation needs - non-medical: None  Occupational History    Employer: [employer]    Comment: out due to back injury  Tobacco Use  . Smoking status: Never Smoker  . Smokeless tobacco: Never Used  Substance and Sexual Activity  . Alcohol use: No    Alcohol/week: 0.0 oz  . Drug use: No  . Sexual activity: No  Other Topics Concern  . None  Social History Narrative  . None   Outpatient Encounter Medications as of 10/08/2017  Medication Sig  . amLODipine (NORVASC) 5 MG tablet Take 5 mg by mouth daily.  Marland Kitchen atorvastatin (LIPITOR) 80 MG tablet Take 80 mg by mouth daily.  . B-D UF III MINI PEN NEEDLES 31G X 5 MM MISC AS DIRECTED DX:250.0  . baclofen (LIORESAL) 10 MG tablet Reported on 02/17/2016  . Cholecalciferol (VITAMIN D3) 5000 units CAPS Take 1 capsule (5,000 Units total) by mouth daily.  . clopidogrel (PLAVIX) 75 MG tablet Take 75 mg by mouth daily.  . Continuous Blood Gluc Sensor (FREESTYLE LIBRE SENSOR SYSTEM) MISC Use one sensor every 10 days.  Marland Kitchen glucose blood (FREESTYLE TEST STRIPS) test strip USE TO TEST BLOOD SUGAR FOUR TIMES DAILY AS DIRECTED  .  insulin NPH-regular Human (NOVOLIN 70/30) (70-30) 100 UNIT/ML injection Inject 30 units with breakfast and 35 units with supper when blood glucose readings are above 90 mg/dL.  . INSULIN SYRINGE .5CC/29G 29G X 1/2" 0.5 ML MISC Use to inject insulin 2 times a day  . losartan-hydrochlorothiazide (HYZAAR) 50-12.5 MG tablet Take 1 tablet by mouth daily.  . [DISCONTINUED] insulin NPH-regular Human (NOVOLIN 70/30) (70-30) 100 UNIT/ML injection Inject 25 units with breakfast and 30 units with supper when blood glucose readings are above 90 mg/dL.  . [DISCONTINUED] insulin NPH-regular Human (NOVOLIN 70/30) (70-30) 100 UNIT/ML injection 25 units in the a.m. & 30 units with supper   No facility-administered encounter medications on file as of 10/08/2017.    ALLERGIES: No Known Allergies VACCINATION STATUS: Immunization History  Administered Date(s) Administered  . Influenza Whole 07/13/2007, 08/07/2009  . Pneumococcal Polysaccharide-23 04/22/2012  . Td 01/11/2007    Diabetes  She presents for her follow-up diabetic visit. She has type 2 diabetes mellitus. Onset time: She was diagnosed at approximate age of 12 years. Her disease course has been worsening. There are no hypoglycemic associated symptoms. Pertinent negatives for hypoglycemia include no confusion, headaches, pallor or seizures. Pertinent negatives for diabetes include no chest pain, no polydipsia, no polyphagia and  no polyuria. There are no hypoglycemic complications. Symptoms are worsening. Diabetic complications include a CVA and nephropathy. Risk factors for coronary artery disease include diabetes mellitus, dyslipidemia, hypertension, obesity and sedentary lifestyle. Compliance with diabetes treatment: She has history of noncompliance . Her weight is increasing steadily. She is following a generally unhealthy diet. When asked about meal planning, she reported none. She has not had a previous visit with a dietitian. She never (She has chronic  back problem.) participates in exercise. Her breakfast blood glucose range is generally 180-200 mg/dl. Her lunch blood glucose range is generally 180-200 mg/dl. Her dinner blood glucose range is generally 180-200 mg/dl. Her overall blood glucose range is 180-200 mg/dl. An ACE inhibitor/angiotensin II receptor blocker is being taken.  Hyperlipidemia  This is a chronic problem. The current episode started more than 1 year ago. The problem is uncontrolled. Exacerbating diseases include diabetes and obesity. Pertinent negatives include no chest pain, leg pain, myalgias or shortness of breath. Current antihyperlipidemic treatment includes statins. Compliance problems: She had history of noncompliance.  Risk factors for coronary artery disease include dyslipidemia, diabetes mellitus, hypertension, obesity and a sedentary lifestyle.  Hypertension  This is a chronic problem. The current episode started more than 1 year ago. The problem is uncontrolled. Pertinent negatives include no chest pain, headaches, palpitations or shortness of breath. Risk factors for coronary artery disease include diabetes mellitus, dyslipidemia, obesity and sedentary lifestyle. Past treatments include angiotensin blockers. Hypertensive end-organ damage includes CVA.    Review of Systems  Constitutional: Negative for unexpected weight change.  HENT: Negative for trouble swallowing and voice change.   Eyes: Negative for visual disturbance.  Respiratory: Negative for cough, shortness of breath and wheezing.   Cardiovascular: Negative for chest pain, palpitations and leg swelling.  Gastrointestinal: Negative for diarrhea, nausea and vomiting.  Endocrine: Negative for cold intolerance, heat intolerance, polydipsia, polyphagia and polyuria.  Musculoskeletal: Negative for arthralgias and myalgias.  Skin: Negative for color change, pallor, rash and wound.  Neurological: Negative for seizures and headaches.  Psychiatric/Behavioral:  Negative for confusion and suicidal ideas.    Objective:    BP (!) 162/82   Pulse 71   Ht 5\' 1"  (1.549 m)   Wt 184 lb (83.5 kg)   BMI 34.77 kg/m   Wt Readings from Last 3 Encounters:  10/08/17 184 lb (83.5 kg)  06/15/17 184 lb (83.5 kg)  03/15/17 173 lb (78.5 kg)    Physical Exam  Constitutional: She is oriented to person, place, and time. She appears well-developed.  HENT:  Head: Normocephalic and atraumatic.  Eyes: EOM are normal.  Neck: Normal range of motion. Neck supple. No tracheal deviation present. No thyromegaly present.  Cardiovascular: Normal rate and regular rhythm.  Pulmonary/Chest: Effort normal and breath sounds normal.  Abdominal: Soft. Bowel sounds are normal. There is no tenderness. There is no guarding.  Musculoskeletal: Normal range of motion. She exhibits no edema.  Neurological: She is alert and oriented to person, place, and time. She has normal reflexes. No cranial nerve deficit. Coordination normal.  Skin: Skin is warm and dry. No rash noted. No erythema. No pallor.  Psychiatric: She has a normal mood and affect.    Results for orders placed or performed in visit on 06/15/17  Renal function panel  Result Value Ref Range   Glucose, Bld 159 (H) 65 - 99 mg/dL   BUN 11 7 - 25 mg/dL   Creat 0.75 0.50 - 0.99 mg/dL   BUN/Creatinine Ratio NOT APPLICABLE 6 -  22 (calc)   Sodium 138 135 - 146 mmol/L   Potassium 4.0 3.5 - 5.3 mmol/L   Chloride 101 98 - 110 mmol/L   CO2 31 20 - 32 mmol/L   Calcium 9.6 8.6 - 10.4 mg/dL   Phosphorus 4.5 2.5 - 4.5 mg/dL   Albumin 4.0 3.6 - 5.1 g/dL  Hemoglobin A1c  Result Value Ref Range   Hgb A1c MFr Bld 11.0 (H) <5.7 % of total Hgb   Mean Plasma Glucose 269 (calc)   eAG (mmol/L) 14.9 (calc)  Lipid panel  Result Value Ref Range   Cholesterol 213 (H) <200 mg/dL   HDL 44 (L) >50 mg/dL   Triglycerides 99 <150 mg/dL   LDL Cholesterol (Calc) 148 (H) mg/dL (calc)   Total CHOL/HDL Ratio 4.8 <5.0 (calc)   Non-HDL Cholesterol  (Calc) 169 (H) <130 mg/dL (calc)  T4, free  Result Value Ref Range   Free T4 1.1 0.8 - 1.8 ng/dL  TSH  Result Value Ref Range   TSH 1.35 0.40 - 4.50 mIU/L   Chemistry (most recent): Lab Results  Component Value Date   NA 138 09/30/2017   K 4.0 09/30/2017   CL 101 09/30/2017   CO2 31 09/30/2017   BUN 11 09/30/2017   CREATININE 0.75 09/30/2017   Diabetic Labs (most recent): Lab Results  Component Value Date   HGBA1C 11.0 (H) 09/30/2017   HGBA1C 10.0 (H) 06/08/2017   HGBA1C 12.1 (H) 03/09/2017   Lipid Panel     Component Value Date/Time   CHOL 213 (H) 09/30/2017 0915   TRIG 99 09/30/2017 0915   HDL 44 (L) 09/30/2017 0915   CHOLHDL 4.8 09/30/2017 0915   VLDL 25 07/23/2016 0856   LDLCALC 73 07/23/2016 0856   LDLDIRECT 135.5 05/02/2012 0945      Assessment & Plan:   1. Uncontrolled type 2 diabetes mellitus with other circulatory complication, with long-term current use of insulin ([place]) - her diabetes is  complicated by carotid stenosis and reversed mild CK D and patient remains at a high risk for more acute and chronic complications of diabetes which include CAD, CVA, CKD, retinopathy, and neuropathy. These are all discussed in detail with the patient.  Patient did  come with better blood glucose readings. Her A1c has increased to 11% from 10% last visit. - Her recent labs reviewed.  - I have re-counseled the patient on diet management and weight loss  by adopting a carbohydrate restricted / protein rich  Diet.  -Suggestion is made for her to avoid simple carbohydrates  from her diet including Cakes, Sweet Desserts, Ice Cream, Soda (diet and regular), Sweet Tea, Candies, Chips, Cookies, Store Bought Juices, Alcohol in Excess of  1-2 drinks a day, Artificial Sweeteners, and "Sugar-free" Products. This will help patient to have stable blood glucose profile and potentially avoid unintended weight gain.  - Patient is advised to stick to a routine mealtimes to eat 3 meals  a  day and avoid unnecessary snacks ( to snack only to correct hypoglycemia).  - I have approached patient with the following individualized plan to manage diabetes and patient agrees.  - Patient has been alarmingly noncompliant and easily disengages from self-care, she kept havingtrouble affording co-pays for  her medications including her insulin. - she wishes to stay on the  cheaper insulin. -   I will would increase her  Novolin 70/30, to 30 units with breakfast and increased to 35 units with supper for pre-meal blood glucose above 90  mg/dL, associated with strict monitoring of blood glucose 4 times a day-before meals and at bedtime. - She would benefit from continuous glucose monitoring, however her insurance did not cover the prescription for  the FreeStyle libre device . -Adjustment parameters for hypo and hyperglycemia were given in a written document to patient. -Patient is encouraged to call clinic for blood glucose levels less than 70 or above 300 mg /dl. - She does not tolerate metformin.   - Patient specific target  for A1c; LDL, HDL, Triglycerides, and  Waist Circumference were discussed in detail.  2) BP/HTN:   state uncontrolled, this is mainly due to the fact that she has not been consistent in taking her blood pressure medications. I urged her to resume and to her current blood pressure medications including  ACEI/ARB. 3) Lipids/HPL:   advised her to continue Zocor 20 mg by mouth daily at bedtime. Marland Kitchen  4)  Weight/Diet:  CDE consult has been requested, exercise, and carbohydrates information provided.  5) Chronic Care/Health Maintenance:  -Patient  Is  on ACEI/ARB and Statin medications and encouraged to continue to follow up with Ophthalmology, Podiatrist at least yearly or according to recommendations, and advised to  stay away from smoking. I have recommended yearly flu vaccine and pneumonia vaccination at least every 5 years; moderate intensity exercise for up to 150 minutes  weekly; and  sleep for at least 7 hours a day.  - Time spent with the patient: 25 min, of which >50% was spent in reviewing her blood glucose logs , discussing her hypo- and hyper-glycemic episodes, reviewing her current and  previous labs and insulin doses and developing a plan to avoid hypo- and hyper-glycemia. Please refer to Patient Instructions for Blood Glucose Monitoring and Insulin/Medications Dosing Guide"  in media tab for additional information.  - I advised patient to maintain close follow up with Sinda Du, MD for primary care needs.  Follow up plan: -Return in about 3 months (around 01/06/2018) for follow up with pre-visit labs, meter, and logs.  Glade Lloyd, MD Phone: [phone]  Fax: [phone]  This note was partially dictated with voice recognition software. Similar sounding words can be transcribed inadequately or may not  be corrected upon review.  10/08/2017, 1:16 PM

## 2017-10-08 NOTE — Patient Instructions (Signed)

## 2017-10-20 DIAGNOSIS — M545 Low back pain: Secondary | ICD-10-CM | POA: Diagnosis not present

## 2017-10-20 DIAGNOSIS — M9905 Segmental and somatic dysfunction of pelvic region: Secondary | ICD-10-CM | POA: Diagnosis not present

## 2017-10-20 DIAGNOSIS — M9902 Segmental and somatic dysfunction of thoracic region: Secondary | ICD-10-CM | POA: Diagnosis not present

## 2017-10-20 DIAGNOSIS — M9903 Segmental and somatic dysfunction of lumbar region: Secondary | ICD-10-CM | POA: Diagnosis not present

## 2017-12-28 ENCOUNTER — Encounter: Payer: Self-pay | Admitting: Pulmonary Disease

## 2017-12-28 ENCOUNTER — Telehealth (HOSPITAL_COMMUNITY): Payer: Self-pay | Admitting: Psychiatry

## 2017-12-28 DIAGNOSIS — M545 Low back pain: Secondary | ICD-10-CM | POA: Diagnosis not present

## 2017-12-28 DIAGNOSIS — I10 Essential (primary) hypertension: Secondary | ICD-10-CM | POA: Diagnosis not present

## 2017-12-28 DIAGNOSIS — E1159 Type 2 diabetes mellitus with other circulatory complications: Secondary | ICD-10-CM | POA: Diagnosis not present

## 2017-12-28 DIAGNOSIS — E785 Hyperlipidemia, unspecified: Secondary | ICD-10-CM | POA: Diagnosis not present

## 2017-12-28 DIAGNOSIS — E1165 Type 2 diabetes mellitus with hyperglycemia: Secondary | ICD-10-CM | POA: Diagnosis not present

## 2017-12-28 LAB — HM HEPATITIS C SCREENING LAB: HM Hepatitis Screen: NEGATIVE

## 2017-12-28 NOTE — Telephone Encounter (Signed)
Therapist returned call to patient and informed patient therapist does not have access to notes prior to 2013. Therefore, therapist can't confirm content of therapy sessions and notes prior to that time.  Therapist and patient agreed therapist would provide statement indicating patient did receive service from 2010 to 2011. Therapist informed patient a consent would need to be completed in order for therapist to write statement. Patient states she will come to office during business hours and sign consent.

## 2017-12-29 LAB — COMPLETE METABOLIC PANEL WITH GFR
AG Ratio: 1.3 (calc) (ref 1.0–2.5)
ALBUMIN MSPROF: 4 g/dL (ref 3.6–5.1)
ALKALINE PHOSPHATASE (APISO): 54 U/L (ref 33–130)
ALT: 14 U/L (ref 6–29)
AST: 15 U/L (ref 10–35)
BILIRUBIN TOTAL: 0.4 mg/dL (ref 0.2–1.2)
BUN: 14 mg/dL (ref 7–25)
CHLORIDE: 103 mmol/L (ref 98–110)
CO2: 32 mmol/L (ref 20–32)
Calcium: 9.4 mg/dL (ref 8.6–10.4)
Creat: 0.75 mg/dL (ref 0.50–0.99)
GFR, Est African American: 99 mL/min/{1.73_m2} (ref >=60)
GFR, Est Non African American: 85 mL/min/{1.73_m2} (ref >=60)
GLOBULIN: 3.1 g/dL (ref 1.9–3.7)
Glucose, Bld: 176 mg/dL — ABNORMAL HIGH (ref 65–139)
POTASSIUM: 4.3 mmol/L (ref 3.5–5.3)
SODIUM: 139 mmol/L (ref 135–146)
Total Protein: 7.1 g/dL (ref 6.1–8.1)

## 2017-12-29 LAB — HEMOGLOBIN A1C
Hgb A1c MFr Bld: 9.3 % of total Hgb — ABNORMAL HIGH (ref <5.7)
Mean Plasma Glucose: 220 (calc)
eAG (mmol/L): 12.2 (calc)

## 2018-01-06 ENCOUNTER — Encounter: Payer: Self-pay | Admitting: "Endocrinology

## 2018-01-06 ENCOUNTER — Ambulatory Visit (INDEPENDENT_AMBULATORY_CARE_PROVIDER_SITE_OTHER): Payer: Medicare Other | Admitting: "Endocrinology

## 2018-01-06 VITALS — BP 160/88 | HR 69 | Ht 61.0 in | Wt 187.0 lb

## 2018-01-06 DIAGNOSIS — E1159 Type 2 diabetes mellitus with other circulatory complications: Secondary | ICD-10-CM | POA: Diagnosis not present

## 2018-01-06 DIAGNOSIS — I10 Essential (primary) hypertension: Secondary | ICD-10-CM

## 2018-01-06 DIAGNOSIS — E559 Vitamin D deficiency, unspecified: Secondary | ICD-10-CM

## 2018-01-06 DIAGNOSIS — E782 Mixed hyperlipidemia: Secondary | ICD-10-CM | POA: Diagnosis not present

## 2018-01-06 MED ORDER — ATORVASTATIN CALCIUM 10 MG PO TABS: 10.0000 mg | ORAL_TABLET | Freq: Every day | ORAL | 3 refills | Status: DC

## 2018-01-06 NOTE — Patient Instructions (Signed)

## 2018-01-06 NOTE — Progress Notes (Signed)
Subjective:    Patient ID: Stacy [name], female    DOB: April 19, 1956, PCP Sinda Du, MD   Past Medical History:  Diagnosis Date  . Chronic back pain   . Diabetes mellitus   . HTN (hypertension)   . Hyperlipidemia   . Osteoarthritis   . Stroke [facility])    Past Surgical History:  Procedure Laterality Date  . carpel tunnel release     bilateral  . CESAREAN SECTION    . COLONOSCOPY  2006   normal, Dr. Benson Norway  . ESOPHAGOGASTRODUODENOSCOPY  2006   normal, Dr. Benson Norway   Social History   Socioeconomic History  . Marital status: Divorced    Spouse name: Not on file  . Number of children: Not on file  . Years of education: Not on file  . Highest education level: Not on file  Occupational History    Employer: [employer]    Comment: out due to back injury  Social Needs  . Financial resource strain: Not on file  . Food insecurity:    Worry: Not on file    Inability: Not on file  . Transportation needs:    Medical: Not on file    Non-medical: Not on file  Tobacco Use  . Smoking status: Never Smoker  . Smokeless tobacco: Never Used  Substance and Sexual Activity  . Alcohol use: No    Alcohol/week: 0.0 oz  . Drug use: No  . Sexual activity: Never  Lifestyle  . Physical activity:    Days per week: Not on file    Minutes per session: Not on file  . Stress: Not on file  Relationships  . Social connections:    Talks on phone: Not on file    Gets together: Not on file    Attends religious service: Not on file    Active member of club or organization: Not on file    Attends meetings of clubs or organizations: Not on file    Relationship status: Not on file  Other Topics Concern  . Not on file  Social History Narrative  . Not on file   Outpatient Encounter Medications as of 01/06/2018  Medication Sig  . amLODipine (NORVASC) 5 MG tablet Take 5 mg by mouth daily.  Marland Kitchen atorvastatin (LIPITOR) 10 MG tablet Take 1 tablet (10 mg total) by mouth daily.  . B-D UF III MINI  PEN NEEDLES 31G X 5 MM MISC AS DIRECTED DX:250.0  . baclofen (LIORESAL) 10 MG tablet Reported on 02/17/2016  . benzonatate (TESSALON) 200 MG capsule 3 (three) times daily.  . Cholecalciferol (VITAMIN D3) 5000 units CAPS Take 1 capsule (5,000 Units total) by mouth daily.  . clopidogrel (PLAVIX) 75 MG tablet Take 75 mg by mouth daily.  . Continuous Blood Gluc Sensor (FREESTYLE LIBRE SENSOR SYSTEM) MISC Use one sensor every 10 days.  Marland Kitchen glucose blood (FREESTYLE TEST STRIPS) test strip USE TO TEST BLOOD SUGAR FOUR TIMES DAILY AS DIRECTED  . insulin NPH-regular Human (NOVOLIN 70/30) (70-30) 100 UNIT/ML injection Inject 30 units with breakfast and 35 units with supper when blood glucose readings are above 90 mg/dL.  . INSULIN SYRINGE .5CC/29G 29G X 1/2" 0.5 ML MISC Use to inject insulin 2 times a day  . losartan-hydrochlorothiazide (HYZAAR) 50-12.5 MG tablet Take 1 tablet by mouth daily.  . [DISCONTINUED] atorvastatin (LIPITOR) 80 MG tablet Take 80 mg by mouth daily.   No facility-administered encounter medications on file as of 01/06/2018.  ALLERGIES: No Known Allergies VACCINATION STATUS: Immunization History  Administered Date(s) Administered  . Influenza Whole 07/13/2007, 08/07/2009  . Pneumococcal Polysaccharide-23 04/22/2012  . Td 01/11/2007    Diabetes  She presents for her follow-up diabetic visit. She has type 2 diabetes mellitus. Onset time: She was diagnosed at approximate age of 83 years. Her disease course has been improving. There are no hypoglycemic associated symptoms. Pertinent negatives for hypoglycemia include no confusion, headaches, pallor or seizures. Pertinent negatives for diabetes include no chest pain, no polydipsia, no polyphagia and no polyuria. There are no hypoglycemic complications. Symptoms are improving. Diabetic complications include a CVA and nephropathy. Risk factors for coronary artery disease include diabetes mellitus, dyslipidemia, hypertension, obesity and  sedentary lifestyle. Compliance with diabetes treatment: She has history of noncompliance . Her weight is stable. She is following a generally unhealthy diet. When asked about meal planning, she reported none. She has not had a previous visit with a dietitian. She never (She has chronic back problem.) participates in exercise. Her breakfast blood glucose range is generally 140-180 mg/dl. Her lunch blood glucose range is generally 180-200 mg/dl. Her dinner blood glucose range is generally 140-180 mg/dl. Her bedtime blood glucose range is generally 140-180 mg/dl. Her overall blood glucose range is 180-200 mg/dl. An ACE inhibitor/angiotensin II receptor blocker is being taken.  Hyperlipidemia  This is a chronic problem. The current episode started more than 1 year ago. The problem is uncontrolled. Exacerbating diseases include diabetes and obesity. Pertinent negatives include no chest pain, leg pain, myalgias or shortness of breath. Current antihyperlipidemic treatment includes statins. Compliance problems: She had history of noncompliance.  Risk factors for coronary artery disease include dyslipidemia, diabetes mellitus, hypertension, obesity and a sedentary lifestyle.  Hypertension  This is a chronic problem. The current episode started more than 1 year ago. The problem is uncontrolled. Pertinent negatives include no chest pain, headaches, palpitations or shortness of breath. Risk factors for coronary artery disease include diabetes mellitus, dyslipidemia, obesity and sedentary lifestyle. Past treatments include angiotensin blockers. Hypertensive end-organ damage includes CVA.    Review of Systems  Constitutional: Negative for unexpected weight change.  HENT: Negative for trouble swallowing and voice change.   Eyes: Negative for visual disturbance.  Respiratory: Negative for cough, shortness of breath and wheezing.   Cardiovascular: Negative for chest pain, palpitations and leg swelling.   Gastrointestinal: Negative for diarrhea, nausea and vomiting.  Endocrine: Negative for cold intolerance, heat intolerance, polydipsia, polyphagia and polyuria.  Musculoskeletal: Negative for arthralgias and myalgias.  Skin: Negative for color change, pallor, rash and wound.  Neurological: Negative for seizures and headaches.  Psychiatric/Behavioral: Negative for confusion and suicidal ideas.    Objective:    BP (!) 160/88   Pulse 69   Ht 5\' 1"  (1.549 m)   Wt 187 lb (84.8 kg)   BMI 35.33 kg/m   Wt Readings from Last 3 Encounters:  01/06/18 187 lb (84.8 kg)  10/08/17 184 lb (83.5 kg)  06/15/17 184 lb (83.5 kg)    Physical Exam  Constitutional: She is oriented to person, place, and time. She appears well-developed.  HENT:  Head: Normocephalic and atraumatic.  Eyes: EOM are normal.  Neck: Normal range of motion. Neck supple. No tracheal deviation present. No thyromegaly present.  Cardiovascular: Normal rate.  Pulmonary/Chest: Effort normal.  Abdominal: There is no tenderness. There is no guarding.  Musculoskeletal: Normal range of motion. She exhibits no edema.  Neurological: She is alert and oriented to person, place, and time.  She has normal reflexes. No cranial nerve deficit. Coordination normal.  Skin: Skin is warm and dry. No rash noted. No erythema. No pallor.  Psychiatric: She has a normal mood and affect.    Results for orders placed or performed in visit on 10/08/17  COMPLETE METABOLIC PANEL WITH GFR  Result Value Ref Range   Glucose, Bld 176 (H) 65 - 139 mg/dL   BUN 14 7 - 25 mg/dL   Creat 0.75 0.50 - 0.99 mg/dL   GFR, Est Non African American 85 > OR = 60 mL/min/1.66m2   GFR, Est African American 99 > OR = 60 mL/min/1.12m2   BUN/Creatinine Ratio NOT APPLICABLE 6 - 22 (calc)   Sodium 139 135 - 146 mmol/L   Potassium 4.3 3.5 - 5.3 mmol/L   Chloride 103 98 - 110 mmol/L   CO2 32 20 - 32 mmol/L   Calcium 9.4 8.6 - 10.4 mg/dL   Total Protein 7.1 6.1 - 8.1 g/dL    Albumin 4.0 3.6 - 5.1 g/dL   Globulin 3.1 1.9 - 3.7 g/dL (calc)   AG Ratio 1.3 1.0 - 2.5 (calc)   Total Bilirubin 0.4 0.2 - 1.2 mg/dL   Alkaline phosphatase (APISO) 54 33 - 130 U/L   AST 15 10 - 35 U/L   ALT 14 6 - 29 U/L  Hemoglobin A1c  Result Value Ref Range   Hgb A1c MFr Bld 9.3 (H) <5.7 % of total Hgb   Mean Plasma Glucose 220 (calc)   eAG (mmol/L) 12.2 (calc)   Diabetic Labs (most recent): Lab Results  Component Value Date   HGBA1C 9.3 (H) 12/28/2017   HGBA1C 11.0 (H) 09/30/2017   HGBA1C 10.0 (H) 06/08/2017   Lipid Panel     Component Value Date/Time   CHOL 213 (H) 09/30/2017 0915   TRIG 99 09/30/2017 0915   HDL 44 (L) 09/30/2017 0915   CHOLHDL 4.8 09/30/2017 0915   VLDL 25 07/23/2016 0856   LDLCALC 148 (H) 09/30/2017 0915   LDLDIRECT 135.5 05/02/2012 0945      Assessment & Plan:   1. Uncontrolled type 2 diabetes mellitus with other circulatory complication, with long-term current use of insulin ([place]) - her diabetes is  complicated by carotid artery stenosis and mild renal insufficiency,  patient remains at a high risk for more acute and chronic complications of diabetes which include CAD, CVA, CKD, retinopathy, and neuropathy. These are all discussed in detail with the patient.  Patient did  come with better blood glucose readings, her A1c improving to 9.3%, generally improving from 12%.   - Her recent labs reviewed.  - I have re-counseled the patient on diet management and weight loss  by adopting a carbohydrate restricted / protein rich  Diet.  -  Suggestion is made for her to avoid simple carbohydrates  from her diet including Cakes, Sweet Desserts / Pastries, Ice Cream, Soda (diet and regular), Sweet Tea, Candies, Chips, Cookies, Store Bought Juices, Alcohol in Excess of  1-2 drinks a day, Artificial Sweeteners, and "Sugar-free" Products. This will help patient to have stable blood glucose profile and potentially avoid unintended weight gain.   - Patient is  advised to stick to a routine mealtimes to eat 3 meals  a day and avoid unnecessary snacks ( to snack only to correct hypoglycemia).  - I have approached patient with the following individualized plan to manage diabetes and patient agrees.  - Patient has history of noncompliance and easily disengages from self-care. -She has  trouble affording co-pays for insulin analogs, however, she did reasonably well on Novolin 70/30. -I advised her to continue Novolin 70/30 30 units with breakfast and 35 units with supper for  pre-meal blood glucose above 90 mg/dL, associated with strict monitoring of blood glucose 4 times a day-before meals and at bedtime. - She would have benefited from continuous glucose monitoring, however her insurance did not cover the prescription for  the FreeStyle libre device . -Adjustment parameters for hypo and hyperglycemia were given in a written document to patient. -Patient is encouraged to call clinic for blood glucose levels less than 70 or above 300 mg /dl. - She does not tolerate metformin.   - Patient specific target  for A1c; LDL, HDL, Triglycerides, and  Waist Circumference were discussed in detail.  2) BP/HTN: Her blood pressure is not controlled to target.  This is mainly due to the fact that she has not been consistent in taking her blood pressure medications. I urged her to resume and to her current blood pressure medications including  ACEI/ARB. 3) Lipids/HPL: She has severe dyslipidemia, not consistent taking her atorvastatin.  She has no particular reason.  I had lowered the dose to atorvastatin 10 mg p.o. nightly.  This will be advanced as tolerated.    4)  Weight/Diet:  CDE consult has been requested, exercise, and carbohydrates information provided.  5) Chronic Care/Health Maintenance:  -Patient  Is  on ACEI/ARB and Statin medications and encouraged to continue to follow up with Ophthalmology, Podiatrist at least yearly or according to recommendations, and  advised to  stay away from smoking. I have recommended yearly flu vaccine and pneumonia vaccination at least every 5 years; moderate intensity exercise for up to 150 minutes weekly; and  sleep for at least 7 hours a day.  - I advised patient to maintain close follow up with Sinda Du, MD for primary care needs.  - Time spent with the patient: 25 min, of which >50% was spent in reviewing her blood glucose logs , discussing her hypo- and hyper-glycemic episodes, reviewing her current and  previous labs and insulin doses and developing a plan to avoid hypo- and hyper-glycemia. Please refer to Patient Instructions for Blood Glucose Monitoring and Insulin/Medications Dosing Guide"  in media tab for additional information. Stacy [name] participated in the discussions, expressed understanding, and voiced agreement with the above plans.  All questions were answered to her satisfaction. she is encouraged to contact clinic should she have any questions or concerns prior to her return visit.   Follow up plan: -Return in about 4 months (around 05/08/2018) for follow up with pre-visit labs, meter, and logs.  Glade Lloyd, MD Phone: [phone]  Fax: [phone]  This note was partially dictated with voice recognition software. Similar sounding words can be transcribed inadequately or may not  be corrected upon review.  01/06/2018, 1:06 PM

## 2018-01-12 DIAGNOSIS — M9905 Segmental and somatic dysfunction of pelvic region: Secondary | ICD-10-CM | POA: Diagnosis not present

## 2018-01-12 DIAGNOSIS — M9902 Segmental and somatic dysfunction of thoracic region: Secondary | ICD-10-CM | POA: Diagnosis not present

## 2018-01-12 DIAGNOSIS — M545 Low back pain: Secondary | ICD-10-CM | POA: Diagnosis not present

## 2018-01-12 DIAGNOSIS — M9903 Segmental and somatic dysfunction of lumbar region: Secondary | ICD-10-CM | POA: Diagnosis not present

## 2018-02-20 ENCOUNTER — Other Ambulatory Visit: Payer: Self-pay

## 2018-02-20 ENCOUNTER — Encounter (HOSPITAL_COMMUNITY): Payer: Self-pay | Admitting: Emergency Medicine

## 2018-02-20 ENCOUNTER — Emergency Department (HOSPITAL_COMMUNITY): Payer: Medicare Other

## 2018-02-20 ENCOUNTER — Emergency Department (HOSPITAL_COMMUNITY)
Admission: EM | Admit: 2018-02-20 | Discharge: 2018-02-20 | Disposition: A | Discharge status: Home / Self Care | Payer: Medicare Other | Attending: Emergency Medicine | Admitting: Emergency Medicine

## 2018-02-20 DIAGNOSIS — Z79899 Other long term (current) drug therapy: Secondary | ICD-10-CM | POA: Insufficient documentation

## 2018-02-20 DIAGNOSIS — I10 Essential (primary) hypertension: Secondary | ICD-10-CM | POA: Insufficient documentation

## 2018-02-20 DIAGNOSIS — Z7902 Long term (current) use of antithrombotics/antiplatelets: Secondary | ICD-10-CM | POA: Insufficient documentation

## 2018-02-20 DIAGNOSIS — Y9301 Activity, walking, marching and hiking: Secondary | ICD-10-CM | POA: Insufficient documentation

## 2018-02-20 DIAGNOSIS — S299XXA Unspecified injury of thorax, initial encounter: Secondary | ICD-10-CM | POA: Diagnosis not present

## 2018-02-20 DIAGNOSIS — Z794 Long term (current) use of insulin: Secondary | ICD-10-CM | POA: Insufficient documentation

## 2018-02-20 DIAGNOSIS — M79631 Pain in right forearm: Secondary | ICD-10-CM | POA: Diagnosis not present

## 2018-02-20 DIAGNOSIS — E1159 Type 2 diabetes mellitus with other circulatory complications: Secondary | ICD-10-CM | POA: Diagnosis not present

## 2018-02-20 DIAGNOSIS — M25511 Pain in right shoulder: Secondary | ICD-10-CM | POA: Diagnosis not present

## 2018-02-20 DIAGNOSIS — Y999 Unspecified external cause status: Secondary | ICD-10-CM | POA: Insufficient documentation

## 2018-02-20 DIAGNOSIS — S59911A Unspecified injury of right forearm, initial encounter: Secondary | ICD-10-CM | POA: Diagnosis not present

## 2018-02-20 DIAGNOSIS — W0110XA Fall on same level from slipping, tripping and stumbling with subsequent striking against unspecified object, initial encounter: Secondary | ICD-10-CM | POA: Diagnosis not present

## 2018-02-20 DIAGNOSIS — S42211A Unspecified displaced fracture of surgical neck of right humerus, initial encounter for closed fracture: Secondary | ICD-10-CM

## 2018-02-20 DIAGNOSIS — S4991XA Unspecified injury of right shoulder and upper arm, initial encounter: Secondary | ICD-10-CM | POA: Diagnosis present

## 2018-02-20 DIAGNOSIS — Y929 Unspecified place or not applicable: Secondary | ICD-10-CM | POA: Insufficient documentation

## 2018-02-20 MED ORDER — HYDROCODONE-ACETAMINOPHEN 5-325 MG PO TABS: 1.0000 | ORAL_TABLET | Freq: Four times a day (QID) | ORAL | 0 refills | Status: DC | PRN

## 2018-02-20 MED ORDER — HYDROCODONE-ACETAMINOPHEN 5-325 MG PO TABS
2.0000 | ORAL_TABLET | Freq: Once | ORAL | Status: AC
  Administered 2018-02-20: 2 via ORAL
  Filled 2018-02-20: qty 2

## 2018-02-20 NOTE — ED Notes (Signed)
Patient transported to CT 

## 2018-02-20 NOTE — ED Provider Notes (Signed)
  Face-to-face evaluation   History: Here for trip and fall with isolated right shoulder injury. Denies head or neck pain.  Physical exam: Alert, elderly female. Right shoulder tender without deformity. N/V intact distally in right hand.  Medical screening examination/treatment/procedure(s) were conducted as a shared visit with non-physician practitioner(s) and myself.  I personally evaluated the patient during the encounter     Daleen Bo, MD 02/20/18 [phone]

## 2018-02-20 NOTE — ED Triage Notes (Signed)
Patient tripped and fell over a sign. Small laceration to right lower leg, pain to right shoulder. Denies LOC.

## 2018-02-20 NOTE — Discharge Instructions (Signed)
You can take 1000 mg of Tylenol.  Do not exceed 4000 mg of Tylenol a day. You can take pain medication for severe or breakthrough pain.    Follow the RICE (Rest, Ice, Compression, Elevation) protocol as directed.   Splint on for mobilization.  Follow-up with Dr. Rich Fuchs office on Tuesday.  You can either show up at clinic at 8 AM or call on Tuesday morning to arrange for the scheduled time.  Return to the emergency department for any numbness/weakness of your hand, discoloration of fingers, worsening pain or any other worsening or concerning symptoms.

## 2018-02-20 NOTE — ED Provider Notes (Signed)
[facility] EMERGENCY DEPARTMENT Provider Note   CSN: [id number] Arrival date & time: 02/20/18  1343     History   Chief Complaint Chief Complaint  Patient presents with  . Fall    HPI Stacy [name] is a 62 y.o. female history of diabetes, hyperlipidemia who presents for evaluation of right upper extremity pain after mechanical fall that occurred earlier today.  Patient reports she was walking when she tripped over something, causing her to fall on her right upper extremity.  Denies any preceding chest pain, dizziness.  Patient states she did not hit her head or lose consciousness.  Patient was able to ambulate immediately after the incident.  Patient reports that she thinks she fell with her arms outstretched and landed on her shoulder.  Patient reports that since then, she has had pain to the right upper extremity at the shoulder, proximal humerus that extends distally.  Patient also reports abrasions to the anterior aspect of the right lower extremity.  Patient reports she has not taken any medication for pain.  Patient denies any vision changes, neck pain, back pain, chest pain, difficulty breathing, abdominal pain, nausea/vomiting, numbness/weakness of her extremities.  The history is provided by the patient.    Past Medical History:  Diagnosis Date  . Chronic back pain   . Diabetes mellitus   . HTN (hypertension)   . Hyperlipidemia   . Osteoarthritis   . Stroke [facility])     Patient Active Problem List   Diagnosis Date Noted  . Vitamin D deficiency 07/31/2016  . Personal history of noncompliance with medical treatment, presenting hazards to health 04/15/2016  . Embolic stroke ([place]) 22/29/7989  . Carotid stenosis 07/02/2015  . GERD (gastroesophageal reflux disease) 02/02/2011  . Fatty infiltration of liver 02/02/2011  . Screening for colorectal cancer 02/02/2011  . ANXIETY DEPRESSION 02/06/2009  . DM type 2 causing vascular disease ([place]) 01/10/2008  .  Mixed hyperlipidemia 01/10/2008  . CARPAL TUNNEL SYNDROME, BILATERAL 01/10/2008  . Essential hypertension, benign 01/10/2008  . OSTEOARTHRITIS 01/10/2008    Past Surgical History:  Procedure Laterality Date  . carpel tunnel release     bilateral  . CESAREAN SECTION    . COLONOSCOPY  2006   normal, Dr. Benson Norway  . ESOPHAGOGASTRODUODENOSCOPY  2006   normal, Dr. Benson Norway     OB History   None      Home Medications    Prior to Admission medications   Medication Sig Start Date End Date Taking? Authorizing Provider  amLODipine (NORVASC) 5 MG tablet Take 5 mg by mouth daily.    [provider]  atorvastatin (LIPITOR) 10 MG tablet Take 1 tablet (10 mg total) by mouth daily. 01/06/18   Cassandria Anger, MD  B-D UF III MINI PEN NEEDLES 31G X 5 MM MISC AS DIRECTED DX:250.0 01/09/13   Bedsole, Amy E, MD  baclofen (LIORESAL) 10 MG tablet Reported on 02/17/2016 08/14/15   [provider]  benzonatate (TESSALON) 200 MG capsule 3 (three) times daily. 12/02/17   [provider]  Cholecalciferol (VITAMIN D3) 5000 units CAPS Take 1 capsule (5,000 Units total) by mouth daily. 07/31/16   Cassandria Anger, MD  clopidogrel (PLAVIX) 75 MG tablet Take 75 mg by mouth daily.    [provider]  Continuous Blood Gluc Sensor (FREESTYLE LIBRE SENSOR SYSTEM) MISC Use one sensor every 10 days. 06/15/17   Cassandria Anger, MD  glucose blood (FREESTYLE TEST STRIPS) test strip USE TO TEST  BLOOD SUGAR FOUR TIMES DAILY AS DIRECTED 09/15/17   Cassandria Anger, MD  HYDROcodone-acetaminophen (NORCO/VICODIN) 5-325 MG tablet Take 1-2 tablets by mouth every 6 (six) hours as needed. 02/20/18   Volanda Napoleon, PA-C  insulin NPH-regular Human (NOVOLIN 70/30) (70-30) 100 UNIT/ML injection Inject 30 units with breakfast and 35 units with supper when blood glucose readings are above 90 mg/dL. 10/08/17   Cassandria Anger, MD  INSULIN SYRINGE .5CC/29G 29G X 1/2" 0.5 ML MISC Use to  inject insulin 2 times a day 03/15/17   Cassandria Anger, MD  losartan-hydrochlorothiazide (HYZAAR) 50-12.5 MG tablet Take 1 tablet by mouth daily. 09/24/15 09/23/16  Cassandria Anger, MD    Family History Family History  Problem Relation Age of Onset  . Diabetes Mother   . Heart disease Mother   . Hyperlipidemia Mother   . Hypertension Mother   . Heart attack Mother   . Diabetes Father   . Heart disease Father   . Hyperlipidemia Father   . Hypertension Father   . Heart attack Father   . Heart disease Brother   . Liver disease Neg Hx   . Colon cancer Neg Hx   . GI problems Neg Hx   . Stroke Neg Hx     Social History Social History   Tobacco Use  . Smoking status: Never Smoker  . Smokeless tobacco: Never Used  Substance Use Topics  . Alcohol use: No    Alcohol/week: 0.0 oz  . Drug use: No     Allergies   Patient has no known allergies.   Review of Systems Review of Systems  Constitutional: Negative for fever.  Eyes: Negative for visual disturbance.  Respiratory: Negative for shortness of breath.   Cardiovascular: Negative for chest pain.  Gastrointestinal: Negative for abdominal pain, nausea and vomiting.  Genitourinary: Negative for hematuria.  Musculoskeletal: Negative for back pain and neck pain.       Right upper extremity pain  Skin: Positive for wound.  Neurological: Negative for weakness, numbness and headaches.     Physical Exam Updated Vital Signs BP (!) 189/81 (BP Location: Left Arm)   Pulse 64   Temp 98.3 F (36.8 C) (Oral)   Resp 16   SpO2 99%   Physical Exam  Constitutional: She is oriented to person, place, and time. She appears well-developed and well-nourished.  HENT:  Head: Normocephalic and atraumatic.  Mouth/Throat: Oropharynx is clear and moist and mucous membranes are normal.  No tenderness to palpation of skull. No deformities or crepitus noted. No open wounds, abrasions or lacerations.   Eyes: Pupils are equal,  round, and reactive to light. Conjunctivae, EOM and lids are normal.  PERRL  Neck: Full passive range of motion without pain.  Full flexion/extension and lateral movement of neck fully intact. No bony midline tenderness. No deformities or crepitus.   Cardiovascular: Normal rate, regular rhythm, normal heart sounds and normal pulses. Exam reveals no gallop and no friction rub.  No murmur heard. Pulses:      Radial pulses are 2+ on the right side, and 2+ on the left side.  Pulmonary/Chest: Effort normal and breath sounds normal.  Abdominal: Soft. Normal appearance. There is no tenderness. There is no rigidity and no guarding.  Musculoskeletal: Normal range of motion.  Tenderness palpation noted to the anterior aspect of the right shoulder, proximal humerus.  There is some overlying soft tissue swelling.  No deformity or crepitus noted.  Limited range of motion secondary  to pain.  No tenderness palpation noted at right elbow.  No deformity or crepitus.  No tenderness palpation noted to right wrist.  No deformity or crepitus noted.  No snuffbox tenderness.  Flexion/extension of left wrist intact without any difficulty.  Limited flexion/extension of elbow secondary to pain.  No abnormalities of the left upper extremity.  Patient with small abrasions noted to the anterior aspect of the right tib-fib.  No tenderness palpation to bilateral knees, bilateral tib-fib, bilateral ankles.  Neurological: She is alert and oriented to person, place, and time.  Follows commands, Moves all extremities  5/5 strength to BUE and BLE  Sensation intact throughout all major nerve distributions  Skin: Skin is warm and dry. Capillary refill takes less than 2 seconds.  Good distal cap refill. RUE is not dusky in appearance or cool to touch.  Psychiatric: She has a normal mood and affect. Her speech is normal.  Nursing note and vitals reviewed.    ED Treatments / Results  Labs (all labs ordered are listed, but only  abnormal results are displayed) Labs Reviewed - No data to display  EKG None  Radiology Dg Shoulder Right  Result Date: 02/20/2018 CLINICAL DATA:  Right shoulder pain since a trip and fall today. Initial encounter. EXAM: RIGHT SHOULDER - 2+ VIEW COMPARISON:  None. FINDINGS: The patient has a transverse fracture through the neck of the humerus. The fracture involves the greater tuberosity which demonstrates mild inferior displacement. No other acute abnormality is seen. IMPRESSION: Surgical neck fracture right humerus involves the greater tuberosity. Electronically Signed   By: Inge Rise M.D.   On: 02/20/2018 14:38   Dg Forearm Right  Result Date: 02/20/2018 CLINICAL DATA:  Right forearm pain since the patient suffered a trip and fall today. Initial encounter. EXAM: RIGHT FOREARM - 2 VIEW COMPARISON:  None. FINDINGS: There is no evidence of fracture or other focal bone lesions. Soft tissues are unremarkable. IMPRESSION: Negative exam. Electronically Signed   By: Inge Rise M.D.   On: 02/20/2018 14:39   Ct Shoulder Right Wo Contrast  Result Date: 02/20/2018 CLINICAL DATA:  Proximal right humerus fracture due to a trip and fall today. Initial encounter. EXAM: CT OF THE UPPER RIGHT EXTREMITY WITHOUT CONTRAST TECHNIQUE: Multidetector CT imaging of the upper right extremity was performed according to the standard protocol. COMPARISON:  Plain films right shoulder this same day. FINDINGS: Bones/Joint/Cartilage Transverse fracture through the surgical neck of the humerus is again identified. Mild impaction 0.7 cm is identified. The fracture involves both the greater and lesser tuberosities without notable displacement. No other acute bony abnormality is seen. Moderate acromioclavicular osteoarthritis is noted. The acromion is type 2. Ligaments Suboptimally assessed by CT. Muscles and Tendons Rotator cuff appears intact. Soft tissues Soft tissue contusion about the patient's fracture is  identified. There is fluid in the subacromial/subdeltoid bursa. IMPRESSION: Mildly impacted surgical neck fracture of the right humerus includes nondisplaced components involving both the greater and lesser tuberosities. Moderate acromioclavicular osteoarthritis. The rotator cuff appears intact. Electronically Signed   By: Inge Rise M.D.   On: 02/20/2018 15:56    Procedures Procedures (including critical care time)  Medications Ordered in ED Medications  HYDROcodone-acetaminophen (NORCO/VICODIN) 5-325 MG per tablet 2 tablet (2 tablets Oral Given 02/20/18 1459)     Initial Impression / Assessment and Plan / ED Course  I have reviewed the triage vital signs and the nursing notes.  Pertinent labs & imaging results that were available during my care of  the patient were reviewed by me and considered in my medical decision making (see chart for details).     62 year old female who presents for evaluation of right upper extremity pain after mechanical fall that occurred today.  Reports she tripped over something.  Reports pain to right upper shoulder right arm since then.  No head injury, LOC.  Patient is not on blood thinners. Patient is afebrile, non-toxic appearing, sitting comfortably on examination table. Vital signs reviewed and stable.  Initial evaluation, patient is hypertensive.  She does have a history of hypertension and states she did not take her medications suspect that this is likely due to pain and the fact that she has not taken her medications.  Patient is neurovascularly intact.  On exam, patient with tenderness palpation noted to the right shoulder, right proximal upper extremity.  Patient with small abrasions noted to anterior aspect of tib-fib.  No overlying deformity.  Consider fracture versus dislocation versus contusion.  X-rays ordered at triage. No tenderness to palpation of skull. No deformities or crepitus noted. No open wounds, abrasions or lacerations.  Given  reassuring exam and NEXUS criteria, no indication for cervical imaging at this time.  Analgesics provided.  X-rays reveal a transverse fracture through the surgical neck of the humerus that involves the greater tuberosity.  There is mention of some mild inferior displacement.  Discussed results with patient.  Discussed patient with Dr. Griffin Basil (Orthopedic) guarding patient's x-ray findings.  He recommends obtaining a CT of her right shoulder for further evaluation of her injury.  Recommend patient be put in a sling immobilizer and plans to follow-up with her in 2 days in his office.  Updated patient on plan she is agreeable.   Reevaluation after sling placement.  Discussed home supportive therapies.  Instructed patient follow-up with orthopedics as directed. Patient had ample opportunity for questions and discussion. All patient's questions were answered with full understanding. Strict return precautions discussed. Patient expresses understanding and agreement to plan.   Final Clinical Impressions(s) / ED Diagnoses   Final diagnoses:  Closed displaced fracture of surgical neck of right humerus, unspecified fracture morphology, initial encounter    ED Discharge Orders        Ordered    HYDROcodone-acetaminophen (NORCO/VICODIN) 5-325 MG tablet  Every 6 hours PRN     02/20/18 1550       Volanda Napoleon, PA-C 02/20/18 1607    Daleen Bo, MD 02/21/18 [phone]

## 2018-02-22 DIAGNOSIS — M25511 Pain in right shoulder: Secondary | ICD-10-CM | POA: Diagnosis not present

## 2018-02-22 DIAGNOSIS — S42294A Other nondisplaced fracture of upper end of right humerus, initial encounter for closed fracture: Secondary | ICD-10-CM | POA: Diagnosis not present

## 2018-03-01 DIAGNOSIS — M25511 Pain in right shoulder: Secondary | ICD-10-CM | POA: Diagnosis not present

## 2018-03-03 ENCOUNTER — Other Ambulatory Visit: Payer: Self-pay

## 2018-03-03 MED ORDER — INSULIN NPH ISOPHANE & REGULAR (70-30) 100 UNIT/ML ~~LOC~~ SUSP: SUBCUTANEOUS | 2 refills | Status: DC

## 2018-03-22 DIAGNOSIS — S42294D Other nondisplaced fracture of upper end of right humerus, subsequent encounter for fracture with routine healing: Secondary | ICD-10-CM | POA: Diagnosis not present

## 2018-04-01 DIAGNOSIS — S42201D Unspecified fracture of upper end of right humerus, subsequent encounter for fracture with routine healing: Secondary | ICD-10-CM | POA: Diagnosis not present

## 2018-04-01 DIAGNOSIS — M25511 Pain in right shoulder: Secondary | ICD-10-CM | POA: Diagnosis not present

## 2018-04-04 DIAGNOSIS — S42201D Unspecified fracture of upper end of right humerus, subsequent encounter for fracture with routine healing: Secondary | ICD-10-CM | POA: Diagnosis not present

## 2018-04-04 DIAGNOSIS — M25511 Pain in right shoulder: Secondary | ICD-10-CM | POA: Diagnosis not present

## 2018-04-08 DIAGNOSIS — M25511 Pain in right shoulder: Secondary | ICD-10-CM | POA: Diagnosis not present

## 2018-04-08 DIAGNOSIS — S42201D Unspecified fracture of upper end of right humerus, subsequent encounter for fracture with routine healing: Secondary | ICD-10-CM | POA: Diagnosis not present

## 2018-04-12 DIAGNOSIS — S42201D Unspecified fracture of upper end of right humerus, subsequent encounter for fracture with routine healing: Secondary | ICD-10-CM | POA: Diagnosis not present

## 2018-04-12 DIAGNOSIS — M25511 Pain in right shoulder: Secondary | ICD-10-CM | POA: Diagnosis not present

## 2018-04-18 DIAGNOSIS — L97901 Non-pressure chronic ulcer of unspecified part of unspecified lower leg limited to breakdown of skin: Secondary | ICD-10-CM | POA: Diagnosis not present

## 2018-04-18 DIAGNOSIS — E119 Type 2 diabetes mellitus without complications: Secondary | ICD-10-CM | POA: Diagnosis not present

## 2018-04-19 DIAGNOSIS — S42201D Unspecified fracture of upper end of right humerus, subsequent encounter for fracture with routine healing: Secondary | ICD-10-CM | POA: Diagnosis not present

## 2018-04-19 DIAGNOSIS — M25511 Pain in right shoulder: Secondary | ICD-10-CM | POA: Diagnosis not present

## 2018-04-26 DIAGNOSIS — S42201D Unspecified fracture of upper end of right humerus, subsequent encounter for fracture with routine healing: Secondary | ICD-10-CM | POA: Diagnosis not present

## 2018-04-26 DIAGNOSIS — M25511 Pain in right shoulder: Secondary | ICD-10-CM | POA: Diagnosis not present

## 2018-04-28 DIAGNOSIS — M25511 Pain in right shoulder: Secondary | ICD-10-CM | POA: Diagnosis not present

## 2018-04-28 DIAGNOSIS — S42201D Unspecified fracture of upper end of right humerus, subsequent encounter for fracture with routine healing: Secondary | ICD-10-CM | POA: Diagnosis not present

## 2018-05-02 DIAGNOSIS — M25511 Pain in right shoulder: Secondary | ICD-10-CM | POA: Diagnosis not present

## 2018-05-02 DIAGNOSIS — S42201D Unspecified fracture of upper end of right humerus, subsequent encounter for fracture with routine healing: Secondary | ICD-10-CM | POA: Diagnosis not present

## 2018-05-04 DIAGNOSIS — E1159 Type 2 diabetes mellitus with other circulatory complications: Secondary | ICD-10-CM | POA: Diagnosis not present

## 2018-05-05 DIAGNOSIS — M25361 Other instability, right knee: Secondary | ICD-10-CM | POA: Diagnosis not present

## 2018-05-05 DIAGNOSIS — S42201D Unspecified fracture of upper end of right humerus, subsequent encounter for fracture with routine healing: Secondary | ICD-10-CM | POA: Diagnosis not present

## 2018-05-05 LAB — COMPLETE METABOLIC PANEL WITH GFR
AG RATIO: 1.3 (calc) (ref 1.0–2.5)
ALKALINE PHOSPHATASE (APISO): 62 U/L (ref 33–130)
ALT: 15 U/L (ref 6–29)
AST: 13 U/L (ref 10–35)
Albumin: 4 g/dL (ref 3.6–5.1)
BILIRUBIN TOTAL: 0.5 mg/dL (ref 0.2–1.2)
BUN: 17 mg/dL (ref 7–25)
CHLORIDE: 101 mmol/L (ref 98–110)
CO2: 30 mmol/L (ref 20–32)
Calcium: 9.8 mg/dL (ref 8.6–10.4)
Creat: 0.85 mg/dL (ref 0.50–0.99)
GFR, Est African American: 85 mL/min/{1.73_m2} (ref >=60)
GFR, Est Non African American: 73 mL/min/{1.73_m2} (ref >=60)
GLUCOSE: 288 mg/dL — AB (ref 65–99)
Globulin: 3 g/dL (calc) (ref 1.9–3.7)
Potassium: 4.4 mmol/L (ref 3.5–5.3)
Sodium: 136 mmol/L (ref 135–146)
TOTAL PROTEIN: 7 g/dL (ref 6.1–8.1)

## 2018-05-05 LAB — HEMOGLOBIN A1C
EAG (MMOL/L): 14.4 (calc)
Hgb A1c MFr Bld: 10.7 % of total Hgb — ABNORMAL HIGH (ref <5.7)
Mean Plasma Glucose: 260 (calc)

## 2018-05-09 DIAGNOSIS — M25511 Pain in right shoulder: Secondary | ICD-10-CM | POA: Diagnosis not present

## 2018-05-09 DIAGNOSIS — S42201D Unspecified fracture of upper end of right humerus, subsequent encounter for fracture with routine healing: Secondary | ICD-10-CM | POA: Diagnosis not present

## 2018-05-10 ENCOUNTER — Ambulatory Visit (INDEPENDENT_AMBULATORY_CARE_PROVIDER_SITE_OTHER): Payer: Medicare Other | Admitting: "Endocrinology

## 2018-05-10 ENCOUNTER — Encounter: Payer: Self-pay | Admitting: "Endocrinology

## 2018-05-10 VITALS — BP 154/73 | HR 73 | Ht 61.0 in | Wt 191.2 lb

## 2018-05-10 DIAGNOSIS — Z9119 Patient's noncompliance with other medical treatment and regimen: Secondary | ICD-10-CM

## 2018-05-10 DIAGNOSIS — E1159 Type 2 diabetes mellitus with other circulatory complications: Secondary | ICD-10-CM | POA: Diagnosis not present

## 2018-05-10 DIAGNOSIS — I10 Essential (primary) hypertension: Secondary | ICD-10-CM | POA: Diagnosis not present

## 2018-05-10 DIAGNOSIS — Z91199 Patient's noncompliance with other medical treatment and regimen due to unspecified reason: Secondary | ICD-10-CM

## 2018-05-10 DIAGNOSIS — E782 Mixed hyperlipidemia: Secondary | ICD-10-CM | POA: Diagnosis not present

## 2018-05-10 MED ORDER — GLUCOSE BLOOD VI STRP: ORAL_STRIP | 5 refills | Status: DC

## 2018-05-10 MED ORDER — INSULIN NPH ISOPHANE & REGULAR (70-30) 100 UNIT/ML ~~LOC~~ SUSP: SUBCUTANEOUS | 2 refills | Status: DC

## 2018-05-10 MED ORDER — FREESTYLE LIBRE SENSOR SYSTEM MISC: 0 refills | Status: DC

## 2018-05-10 NOTE — Progress Notes (Signed)
Endocrinology follow-up note  Subjective:    Patient ID: Stacy [name], female    DOB: 09-17-1956, PCP Sinda Du, MD   Past Medical History:  Diagnosis Date  . Chronic back pain   . Diabetes mellitus   . HTN (hypertension)   . Hyperlipidemia   . Osteoarthritis   . Stroke [facility])    Past Surgical History:  Procedure Laterality Date  . carpel tunnel release     bilateral  . CESAREAN SECTION    . COLONOSCOPY  2006   normal, Dr. Benson Norway  . ESOPHAGOGASTRODUODENOSCOPY  2006   normal, Dr. Benson Norway   Social History   Socioeconomic History  . Marital status: Divorced    Spouse name: Not on file  . Number of children: Not on file  . Years of education: Not on file  . Highest education level: Not on file  Occupational History    Employer: [employer]    Comment: out due to back injury  Social Needs  . Financial resource strain: Not on file  . Food insecurity:    Worry: Not on file    Inability: Not on file  . Transportation needs:    Medical: Not on file    Non-medical: Not on file  Tobacco Use  . Smoking status: Never Smoker  . Smokeless tobacco: Never Used  Substance and Sexual Activity  . Alcohol use: No    Alcohol/week: 0.0 oz  . Drug use: No  . Sexual activity: Never  Lifestyle  . Physical activity:    Days per week: Not on file    Minutes per session: Not on file  . Stress: Not on file  Relationships  . Social connections:    Talks on phone: Not on file    Gets together: Not on file    Attends religious service: Not on file    Active member of club or organization: Not on file    Attends meetings of clubs or organizations: Not on file    Relationship status: Not on file  Other Topics Concern  . Not on file  Social History Narrative  . Not on file   Outpatient Encounter Medications as of 05/10/2018  Medication Sig  . Cholecalciferol (VITAMIN D3) 5000 units CAPS Take 1 capsule (5,000 Units total) by mouth daily.  . Continuous Blood Gluc Sensor  (FREESTYLE LIBRE SENSOR SYSTEM) MISC Use one sensor every 10 days.  Marland Kitchen glucose blood (FREESTYLE TEST STRIPS) test strip USE TO TEST BLOOD SUGAR FOUR TIMES DAILY AS DIRECTED  . insulin NPH-regular Human (NOVOLIN 70/30) (70-30) 100 UNIT/ML injection Inject 30 units with breakfast and 30 units with supper when blood glucose readings are above 90 mg/dL.  . INSULIN SYRINGE .5CC/29G 29G X 1/2" 0.5 ML MISC Use to inject insulin 2 times a day  . [DISCONTINUED] Continuous Blood Gluc Sensor (FREESTYLE LIBRE SENSOR SYSTEM) MISC Use one sensor every 10 days.  . [DISCONTINUED] glucose blood (FREESTYLE TEST STRIPS) test strip USE TO TEST BLOOD SUGAR FOUR TIMES DAILY AS DIRECTED  . [DISCONTINUED] insulin NPH-regular Human (NOVOLIN 70/30) (70-30) 100 UNIT/ML injection Inject 30 units with breakfast and 35 units with supper when blood glucose readings are above 90 mg/dL.  Marland Kitchen amLODipine (NORVASC) 5 MG tablet Take 5 mg by mouth daily.  . B-D UF III MINI PEN NEEDLES 31G X 5 MM MISC AS DIRECTED DX:250.0 (Patient not taking: Reported on 05/10/2018)  . baclofen (LIORESAL) 10 MG tablet Reported on 02/17/2016  . benzonatate (TESSALON) 200  MG capsule 3 (three) times daily.  . clopidogrel (PLAVIX) 75 MG tablet Take 75 mg by mouth daily.  Marland Kitchen HYDROcodone-acetaminophen (NORCO/VICODIN) 5-325 MG tablet Take 1-2 tablets by mouth every 6 (six) hours as needed. (Patient not taking: Reported on 05/10/2018)  . losartan-hydrochlorothiazide (HYZAAR) 50-12.5 MG tablet Take 1 tablet by mouth daily.  . [DISCONTINUED] atorvastatin (LIPITOR) 10 MG tablet Take 1 tablet (10 mg total) by mouth daily. (Patient not taking: Reported on 05/10/2018)   No facility-administered encounter medications on file as of 05/10/2018.    ALLERGIES: No Known Allergies VACCINATION STATUS: Immunization History  Administered Date(s) Administered  . Influenza Whole 07/13/2007, 08/07/2009  . Pneumococcal Polysaccharide-23 04/22/2012  . Td 01/11/2007    Diabetes   She presents for her follow-up diabetic visit. She has type 2 diabetes mellitus. Onset time: She was diagnosed at approximate age of 58 years. Her disease course has been worsening. There are no hypoglycemic associated symptoms. Pertinent negatives for hypoglycemia include no confusion, headaches, pallor or seizures. Pertinent negatives for diabetes include no chest pain, no polydipsia, no polyphagia and no polyuria. There are no hypoglycemic complications. Symptoms are worsening. Diabetic complications include a CVA and nephropathy. Risk factors for coronary artery disease include diabetes mellitus, dyslipidemia, hypertension, obesity and sedentary lifestyle. Compliance with diabetes treatment: She has history of noncompliance . Her weight is increasing steadily. She is following a generally unhealthy diet. When asked about meal planning, she reported none. She has not had a previous visit with a dietitian. She never (She has chronic back problem.) participates in exercise. There is no compliance with monitoring of blood glucose. (She came with a dysfunctional meter, no logs to review her insulin administration activities.  Her A1c is 10.7% higher than last visit when it was 9.3%.) An ACE inhibitor/angiotensin II receptor blocker is being taken.  Hyperlipidemia  This is a chronic problem. The current episode started more than 1 year ago. The problem is uncontrolled. Exacerbating diseases include diabetes and obesity. Pertinent negatives include no chest pain, leg pain, myalgias or shortness of breath. Current antihyperlipidemic treatment includes statins. Compliance problems: She had history of noncompliance.  Risk factors for coronary artery disease include dyslipidemia, diabetes mellitus, hypertension, obesity and a sedentary lifestyle.  Hypertension  This is a chronic problem. The current episode started more than 1 year ago. The problem is uncontrolled. Pertinent negatives include no chest pain,  headaches, palpitations or shortness of breath. Risk factors for coronary artery disease include diabetes mellitus, dyslipidemia, obesity and sedentary lifestyle. Past treatments include angiotensin blockers. Hypertensive end-organ damage includes CVA.    Review of Systems  Constitutional: Negative for unexpected weight change.  HENT: Negative for trouble swallowing and voice change.   Eyes: Negative for visual disturbance.  Respiratory: Negative for cough, shortness of breath and wheezing.   Cardiovascular: Negative for chest pain, palpitations and leg swelling.  Gastrointestinal: Negative for diarrhea, nausea and vomiting.  Endocrine: Negative for cold intolerance, heat intolerance, polydipsia, polyphagia and polyuria.  Musculoskeletal: Negative for arthralgias and myalgias.  Skin: Negative for color change, pallor, rash and wound.  Neurological: Negative for seizures and headaches.  Psychiatric/Behavioral: Negative for confusion and suicidal ideas.    Objective:    BP (!) 154/73   Pulse 73   Ht 5\' 1"  (1.549 m)   Wt 191 lb 3.2 oz (86.7 kg)   SpO2 96%   BMI 36.13 kg/m   Wt Readings from Last 3 Encounters:  05/10/18 191 lb 3.2 oz (86.7 kg)  01/06/18 187  lb (84.8 kg)  10/08/17 184 lb (83.5 kg)    Physical Exam  Constitutional: She is oriented to person, place, and time. She appears well-developed.  HENT:  Head: Normocephalic and atraumatic.  Eyes: EOM are normal.  Neck: Normal range of motion. Neck supple. No tracheal deviation present. No thyromegaly present.  Cardiovascular: Normal rate.  Pulmonary/Chest: Effort normal.  Abdominal: There is no tenderness. There is no guarding.  Musculoskeletal: Normal range of motion. She exhibits no edema.  Neurological: She is alert and oriented to person, place, and time. No cranial nerve deficit. Coordination normal.  Skin: Skin is warm and dry. No rash noted. No erythema. No pallor.  Psychiatric: She has a normal mood and affect.     Results for orders placed or performed in visit on 01/06/18  COMPLETE METABOLIC PANEL WITH GFR  Result Value Ref Range   Glucose, Bld 288 (H) 65 - 99 mg/dL   BUN 17 7 - 25 mg/dL   Creat 0.85 0.50 - 0.99 mg/dL   GFR, Est Non African American 73 > OR = 60 mL/min/1.102m2   GFR, Est African American 85 > OR = 60 mL/min/1.24m2   BUN/Creatinine Ratio NOT APPLICABLE 6 - 22 (calc)   Sodium 136 135 - 146 mmol/L   Potassium 4.4 3.5 - 5.3 mmol/L   Chloride 101 98 - 110 mmol/L   CO2 30 20 - 32 mmol/L   Calcium 9.8 8.6 - 10.4 mg/dL   Total Protein 7.0 6.1 - 8.1 g/dL   Albumin 4.0 3.6 - 5.1 g/dL   Globulin 3.0 1.9 - 3.7 g/dL (calc)   AG Ratio 1.3 1.0 - 2.5 (calc)   Total Bilirubin 0.5 0.2 - 1.2 mg/dL   Alkaline phosphatase (APISO) 62 33 - 130 U/L   AST 13 10 - 35 U/L   ALT 15 6 - 29 U/L  Hemoglobin A1c  Result Value Ref Range   Hgb A1c MFr Bld 10.7 (H) <5.7 % of total Hgb   Mean Plasma Glucose 260 (calc)   eAG (mmol/L) 14.4 (calc)   Diabetic Labs (most recent): Lab Results  Component Value Date   HGBA1C 10.7 (H) 05/04/2018   HGBA1C 9.3 (H) 12/28/2017   HGBA1C 11.0 (H) 09/30/2017   Lipid Panel     Component Value Date/Time   CHOL 213 (H) 09/30/2017 0915   TRIG 99 09/30/2017 0915   HDL 44 (L) 09/30/2017 0915   CHOLHDL 4.8 09/30/2017 0915   VLDL 25 07/23/2016 0856   LDLCALC 148 (H) 09/30/2017 0915   LDLDIRECT 135.5 05/02/2012 0945      Assessment & Plan:   1. Uncontrolled type 2 diabetes mellitus with other circulatory complication, with long-term current use of insulin ([place]) - her diabetes is  complicated by carotid artery stenosis and mild renal insufficiency, and compliant/nonadherence, patient remains at extremely high risk for more acute and chronic complications of diabetes which include CAD, CVA, CKD, retinopathy, and neuropathy. These are all discussed in detail with the patient.  -She did not bring her logs, came with dysfunctional meter, unable to read her readings  off of it.  Her A1c is 10.7% increasing from 9.3%.    - Her recent labs reviewed.  - I have re-counseled the patient on diet management and weight loss  by adopting a carbohydrate restricted / protein rich  Diet. -  Suggestion is made for her to avoid simple carbohydrates  from her diet including Cakes, Sweet Desserts / Pastries, Ice Cream, Soda (diet and regular),  Sweet Tea, Candies, Chips, Cookies, Store Bought Juices, Alcohol in Excess of  1-2 drinks a day, Artificial Sweeteners, and "Sugar-free" Products. This will help patient to have stable blood glucose profile and potentially avoid unintended weight gain.  - Patient is advised to stick to a routine mealtimes to eat 3 meals  a day and avoid unnecessary snacks ( to snack only to correct hypoglycemia).  - I have approached patient with the following individualized plan to manage diabetes and patient agrees.  -He has chronically uncontrolled type 2 diabetes worsened by her chronic  noncompliance /nonadherence and patient still easily disengages from self-care. -She has trouble affording co-pays for insulin analogs, however, she did reasonably well on Novolin 70/30 in the past. -I advised her to resume Novolin 70/30 30 units with breakfast and 30 units with supper for  pre-meal blood glucose above 90 mg/dL. -She is advised to start monitoring blood glucose 4 times a day-before meals and at bedtime and return in 10 days with her meter and logs for reevaluation.  -If her glycemic profile is not satisfactory, she will be considered for insulin analogs.   - She would have benefited from continuous glucose monitoring, however her insurance did not provide coverage for the prescription.   -Adjustment parameters for hypo and hyperglycemia were given in a written document to patient. -Patient is encouraged to call clinic for blood glucose levels less than 70 or above 300 mg /dl. - She does not tolerate metformin.   - Patient specific target  for  A1c; LDL, HDL, Triglycerides, and  Waist Circumference were discussed in detail.  2) BP/HTN: Her blood pressure is not controlled to target.  This is mainly due to the fact that she has not been consistent in taking her blood pressure medications. I urged her to resume and to her current blood pressure medications including losartan-hydrochlorothiazide, amlodipine.    3) Lipids/HPL: She has severe dyslipidemia, not consistent taking her atorvastatin.  She complains of diffuse arthralgias as a side effect of atorvastatin.  She will be considered for Repatha prescription on subsequent visits after repeat fasting lipid panel.     4)  Weight/Diet:  CDE consult has been requested, exercise, and carbohydrates information provided.  5) Chronic Care/Health Maintenance:  -Patient  Is  on ACEI/ARB and Statin medications and encouraged to continue to follow up with Ophthalmology, Podiatrist at least yearly or according to recommendations, and advised to  stay away from smoking. I have recommended yearly flu vaccine and pneumonia vaccination at least every 5 years; moderate intensity exercise for up to 150 minutes weekly; and  sleep for at least 7 hours a day.  - I advised patient to maintain close follow up with Sinda Du, MD for primary care needs.  - Time spent with the patient: 25 min, of which >50% was spent in reviewing her blood glucose logs , discussing her hypo- and hyper-glycemic episodes, reviewing her current and  previous labs and insulin doses and developing a plan to avoid hypo- and hyper-glycemia. Please refer to Patient Instructions for Blood Glucose Monitoring and Insulin/Medications Dosing Guide"  in media tab for additional information. Stacy [name] participated in the discussions, expressed understanding, and voiced agreement with the above plans.  All questions were answered to her satisfaction. she is encouraged to contact clinic should she have any questions or concerns prior to  her return visit.  Follow up plan: -Return in about 10 days (around 05/20/2018) for follow up with meter and logs- no labs.  Glade Lloyd, MD Phone: [phone]  Fax: [phone]  This note was partially dictated with voice recognition software. Similar sounding words can be transcribed inadequately or may not  be corrected upon review.  05/10/2018, 1:21 PM

## 2018-05-10 NOTE — Patient Instructions (Signed)

## 2018-05-12 DIAGNOSIS — M25511 Pain in right shoulder: Secondary | ICD-10-CM | POA: Diagnosis not present

## 2018-05-12 DIAGNOSIS — S42201D Unspecified fracture of upper end of right humerus, subsequent encounter for fracture with routine healing: Secondary | ICD-10-CM | POA: Diagnosis not present

## 2018-05-17 DIAGNOSIS — M25511 Pain in right shoulder: Secondary | ICD-10-CM | POA: Diagnosis not present

## 2018-05-17 DIAGNOSIS — S42201D Unspecified fracture of upper end of right humerus, subsequent encounter for fracture with routine healing: Secondary | ICD-10-CM | POA: Diagnosis not present

## 2018-05-20 DIAGNOSIS — M25511 Pain in right shoulder: Secondary | ICD-10-CM | POA: Diagnosis not present

## 2018-05-30 ENCOUNTER — Encounter: Payer: Self-pay | Admitting: "Endocrinology

## 2018-05-30 ENCOUNTER — Ambulatory Visit (INDEPENDENT_AMBULATORY_CARE_PROVIDER_SITE_OTHER): Payer: Medicare Other | Admitting: "Endocrinology

## 2018-05-30 VITALS — BP 146/80 | HR 73 | Ht 61.0 in | Wt 193.0 lb

## 2018-05-30 DIAGNOSIS — E1159 Type 2 diabetes mellitus with other circulatory complications: Secondary | ICD-10-CM

## 2018-05-30 DIAGNOSIS — I10 Essential (primary) hypertension: Secondary | ICD-10-CM

## 2018-05-30 DIAGNOSIS — E782 Mixed hyperlipidemia: Secondary | ICD-10-CM | POA: Diagnosis not present

## 2018-05-30 MED ORDER — INSULIN NPH ISOPHANE & REGULAR (70-30) 100 UNIT/ML ~~LOC~~ SUSP: SUBCUTANEOUS | 2 refills | Status: DC

## 2018-05-30 MED ORDER — GLUCOSE BLOOD VI STRP: ORAL_STRIP | 5 refills | Status: DC

## 2018-05-30 NOTE — Patient Instructions (Signed)

## 2018-05-30 NOTE — Progress Notes (Signed)
Endocrinology follow-up note  Subjective:    Patient ID: Stacy [name], female    DOB: Jul 15, 1956, PCP Sinda Du, MD   Past Medical History:  Diagnosis Date  . Chronic back pain   . Diabetes mellitus   . HTN (hypertension)   . Hyperlipidemia   . Osteoarthritis   . Stroke [facility])    Past Surgical History:  Procedure Laterality Date  . carpel tunnel release     bilateral  . CESAREAN SECTION    . COLONOSCOPY  2006   normal, Dr. Benson [place]  . ESOPHAGOGASTRODUODENOSCOPY  2006   normal, Dr. Benson Norway   Social History   Socioeconomic History  . Marital status: Divorced    Spouse name: Not on file  . Number of children: Not on file  . Years of education: Not on file  . Highest education level: Not on file  Occupational History    Employer: [employer]    Comment: out due to back injury  Social Needs  . Financial resource strain: Not on file  . Food insecurity:    Worry: Not on file    Inability: Not on file  . Transportation needs:    Medical: Not on file    Non-medical: Not on file  Tobacco Use  . Smoking status: Never Smoker  . Smokeless tobacco: Never Used  Substance and Sexual Activity  . Alcohol use: No    Alcohol/week: 0.0 standard drinks  . Drug use: No  . Sexual activity: Never  Lifestyle  . Physical activity:    Days per week: Not on file    Minutes per session: Not on file  . Stress: Not on file  Relationships  . Social connections:    Talks on phone: Not on file    Gets together: Not on file    Attends religious service: Not on file    Active member of club or organization: Not on file    Attends meetings of clubs or organizations: Not on file    Relationship status: Not on file  Other Topics Concern  . Not on file  Social History Narrative  . Not on file   Outpatient Encounter Medications as of 05/30/2018  Medication Sig  . amLODipine (NORVASC) 5 MG tablet Take 5 mg by mouth daily.  . B-D UF III MINI PEN NEEDLES 31G X 5 MM MISC AS DIRECTED  DX:250.0 (Patient not taking: Reported on 05/10/2018)  . baclofen (LIORESAL) 10 MG tablet Reported on 02/17/2016  . benzonatate (TESSALON) 200 MG capsule 3 (three) times daily.  . Cholecalciferol (VITAMIN D3) 5000 units CAPS Take 1 capsule (5,000 Units total) by mouth daily.  . clopidogrel (PLAVIX) 75 MG tablet Take 75 mg by mouth daily.  Marland Kitchen glucose blood (FREESTYLE TEST STRIPS) test strip USE TO TEST BLOOD SUGAR FOUR TIMES DAILY AS DIRECTED  . insulin NPH-regular Human (NOVOLIN 70/30) (70-30) 100 UNIT/ML injection Inject 30 units with breakfast and 30 units with supper when blood glucose readings are above 90 mg/dL.  . INSULIN SYRINGE .5CC/29G 29G X 1/2" 0.5 ML MISC Use to inject insulin 2 times a day  . losartan-hydrochlorothiazide (HYZAAR) 50-12.5 MG tablet Take 1 tablet by mouth daily.  . [DISCONTINUED] Continuous Blood Gluc Sensor (FREESTYLE LIBRE SENSOR SYSTEM) MISC Use one sensor every 10 days.  . [DISCONTINUED] glucose blood (FREESTYLE TEST STRIPS) test strip USE TO TEST BLOOD SUGAR FOUR TIMES DAILY AS DIRECTED  . [DISCONTINUED] HYDROcodone-acetaminophen (NORCO/VICODIN) 5-325 MG tablet Take 1-2 tablets by mouth every  6 (six) hours as needed. (Patient not taking: Reported on 05/10/2018)  . [DISCONTINUED] insulin NPH-regular Human (NOVOLIN 70/30) (70-30) 100 UNIT/ML injection Inject 30 units with breakfast and 30 units with supper when blood glucose readings are above 90 mg/dL.   No facility-administered encounter medications on file as of 05/30/2018.    ALLERGIES: No Known Allergies VACCINATION STATUS: Immunization History  Administered Date(s) Administered  . Influenza Whole 07/13/2007, 08/07/2009  . Pneumococcal Polysaccharide-23 04/22/2012  . Td 01/11/2007    Diabetes  She presents for her follow-up diabetic visit. She has type 2 diabetes mellitus. Onset time: She was diagnosed at approximate age of 69 years. Her disease course has been improving. There are no hypoglycemic associated  symptoms. Pertinent negatives for hypoglycemia include no confusion, headaches, pallor or seizures. Pertinent negatives for diabetes include no chest pain, no polydipsia, no polyphagia and no polyuria. There are no hypoglycemic complications. Symptoms are improving. Diabetic complications include a CVA and nephropathy. Risk factors for coronary artery disease include diabetes mellitus, dyslipidemia, hypertension, obesity and sedentary lifestyle. Compliance with diabetes treatment: She has history of noncompliance . Her weight is increasing steadily. She is following a generally unhealthy diet. When asked about meal planning, she reported none. She has not had a previous visit with a dietitian. She never (She has chronic back problem.) participates in exercise. She monitors blood glucose at home 3-4 x per day. Blood glucose monitoring compliance is adequate. Her home blood glucose trend is fluctuating dramatically. Her overall blood glucose range is 180-200 mg/dl. (She came with a dysfunctional meter, no logs to review her insulin administration activities.  Her A1c is 10.7% higher than last visit when it was 9.3%.) An ACE inhibitor/angiotensin II receptor blocker is being taken.  Hyperlipidemia  This is a chronic problem. The current episode started more than 1 year ago. The problem is uncontrolled. Exacerbating diseases include diabetes and obesity. Pertinent negatives include no chest pain, leg pain, myalgias or shortness of breath. Current antihyperlipidemic treatment includes statins. Compliance problems: She had history of noncompliance.  Risk factors for coronary artery disease include dyslipidemia, diabetes mellitus, hypertension, obesity and a sedentary lifestyle.  Hypertension  This is a chronic problem. The current episode started more than 1 year ago. The problem is uncontrolled. Pertinent negatives include no chest pain, headaches, palpitations or shortness of breath. Risk factors for coronary artery  disease include diabetes mellitus, dyslipidemia, obesity and sedentary lifestyle. Past treatments include angiotensin blockers. Hypertensive end-organ damage includes CVA.    Review of Systems  Constitutional: Negative for unexpected weight change.  HENT: Negative for trouble swallowing and voice change.   Eyes: Negative for visual disturbance.  Respiratory: Negative for cough, shortness of breath and wheezing.   Cardiovascular: Negative for chest pain, palpitations and leg swelling.  Gastrointestinal: Negative for diarrhea, nausea and vomiting.  Endocrine: Negative for cold intolerance, heat intolerance, polydipsia, polyphagia and polyuria.  Musculoskeletal: Negative for arthralgias and myalgias.  Skin: Negative for color change, pallor, rash and wound.  Neurological: Negative for seizures and headaches.  Psychiatric/Behavioral: Negative for confusion and suicidal ideas.    Objective:    BP (!) 146/80   Pulse 73   Ht 5\' 1"  (1.549 m)   Wt 193 lb (87.5 kg)   BMI 36.47 kg/m   Wt Readings from Last 3 Encounters:  05/30/18 193 lb (87.5 kg)  05/10/18 191 lb 3.2 oz (86.7 kg)  01/06/18 187 lb (84.8 kg)    Physical Exam  Constitutional: She is oriented to person,  place, and time. She appears well-developed.  HENT:  Head: Normocephalic and atraumatic.  Eyes: EOM are normal.  Neck: Normal range of motion. Neck supple. No tracheal deviation present. No thyromegaly present.  Cardiovascular: Normal rate.  Pulmonary/Chest: Effort normal.  Abdominal: There is no tenderness. There is no guarding.  Musculoskeletal: Normal range of motion. She exhibits no edema.  Neurological: She is alert and oriented to person, place, and time. No cranial nerve deficit. Coordination normal.  Skin: Skin is warm and dry. No rash noted. No erythema. No pallor.  Psychiatric: She has a normal mood and affect.    Results for orders placed or performed in visit on 01/06/18  COMPLETE METABOLIC PANEL WITH GFR   Result Value Ref Range   Glucose, Bld 288 (H) 65 - 99 mg/dL   BUN 17 7 - 25 mg/dL   Creat 0.85 0.50 - 0.99 mg/dL   GFR, Est Non African American 73 > OR = 60 mL/min/1.49m2   GFR, Est African American 85 > OR = 60 mL/min/1.66m2   BUN/Creatinine Ratio NOT APPLICABLE 6 - 22 (calc)   Sodium 136 135 - 146 mmol/L   Potassium 4.4 3.5 - 5.3 mmol/L   Chloride 101 98 - 110 mmol/L   CO2 30 20 - 32 mmol/L   Calcium 9.8 8.6 - 10.4 mg/dL   Total Protein 7.0 6.1 - 8.1 g/dL   Albumin 4.0 3.6 - 5.1 g/dL   Globulin 3.0 1.9 - 3.7 g/dL (calc)   AG Ratio 1.3 1.0 - 2.5 (calc)   Total Bilirubin 0.5 0.2 - 1.2 mg/dL   Alkaline phosphatase (APISO) 62 33 - 130 U/L   AST 13 10 - 35 U/L   ALT 15 6 - 29 U/L  Hemoglobin A1c  Result Value Ref Range   Hgb A1c MFr Bld 10.7 (H) <5.7 % of total Hgb   Mean Plasma Glucose 260 (calc)   eAG (mmol/L) 14.4 (calc)   Diabetic Labs (most recent): Lab Results  Component Value Date   HGBA1C 10.7 (H) 05/04/2018   HGBA1C 9.3 (H) 12/28/2017   HGBA1C 11.0 (H) 09/30/2017   Lipid Panel     Component Value Date/Time   CHOL 213 (H) 09/30/2017 0915   TRIG 99 09/30/2017 0915   HDL 44 (L) 09/30/2017 0915   CHOLHDL 4.8 09/30/2017 0915   VLDL 25 07/23/2016 0856   LDLCALC 148 (H) 09/30/2017 0915   LDLDIRECT 135.5 05/02/2012 0945      Assessment & Plan:   1. Uncontrolled type 2 diabetes mellitus with other circulatory complication, with long-term current use of insulin ([place]) - her diabetes is  complicated by carotid artery stenosis and mild renal insufficiency, and history of noncompliance / non-aherence, patient remains at extremely high risk for more acute and chronic complications of diabetes which include CAD, CVA, CKD, retinopathy, and neuropathy. These are all discussed in detail with the patient.  -She  came with improving glycemic profile, better consistency on an insulin injection since her last visit.  Her most recent A1c was 10.7% increasing from 9.3%.    - Her  recent labs reviewed.  - I have re-counseled the patient on diet management and weight loss  by adopting a carbohydrate restricted / protein rich  Diet.  -  Suggestion is made for her to avoid simple carbohydrates  from her diet including Cakes, Sweet Desserts / Pastries, Ice Cream, Soda (diet and regular), Sweet Tea, Candies, Chips, Cookies, Store Bought Juices, Alcohol in Excess of  1-2 drinks  a day, Artificial Sweeteners, and "Sugar-free" Products. This will help patient to have stable blood glucose profile and potentially avoid unintended weight gain.   - Patient is advised to stick to a routine mealtimes to eat 3 meals  a day and avoid unnecessary snacks ( to snack only to correct hypoglycemia).  - I have approached patient with the following individualized plan to manage diabetes and patient agrees.  -She has chronically uncontrolled type 2 diabetes worsened by her chronic  noncompliance /nonadherence and patient still easily disengages from self-care. -She has trouble affording co-pays for insulin analogs, however, she did reasonably well on Novolin 70/30 in the past. -I advised her to continue Novolin 70/30  30 units with breakfast and 30 units with supper for  pre-meal blood glucose above 90 mg/dL. -She is advised to start monitoring blood glucose 4 times a day-before meals and at bedtime .   -If her glycemic profile is not satisfactory, she will be considered for insulin analogs.   - She would have benefited from continuous glucose monitoring, however her insurance did not provide coverage for the prescription.   -Adjustment parameters for hypo and hyperglycemia were given in a written document to patient. -Patient is encouraged to call clinic for blood glucose levels less than 70 or above 300 mg /dl. - She does not tolerate metformin.   - Patient specific target  for A1c; LDL, HDL, Triglycerides, and  Waist Circumference were discussed in detail.  2) BP/HTN: Her blood pressure is  not controlled to target .  She is advised to continue her current blood pressure medications including losartan-hydrochlorothiazide, amlodipine.    3) Lipids/HPL: She has severe dyslipidemia, not consistent taking her atorvastatin.  She complains of diffuse arthralgias as a side effect of atorvastatin.  She will be considered for Repatha prescription on subsequent visits after repeat fasting lipid panel.     4)  Weight/Diet:  CDE consult has been requested, exercise, and carbohydrates information provided.  5) Chronic Care/Health Maintenance:  -Patient  is  on ACEI/ARB and Statin medications and encouraged to continue to follow up with Ophthalmology, Podiatrist at least yearly or according to recommendations, and advised to  stay away from smoking. I have recommended yearly flu vaccine and pneumonia vaccination at least every 5 years; moderate intensity exercise for up to 150 minutes weekly; and  sleep for at least 7 hours a day.  - I advised patient to maintain close follow up with Sinda Du, MD for primary care needs. - Time spent with the patient: 25 min, of which >50% was spent in reviewing her blood glucose logs , discussing her hypo- and hyper-glycemic episodes, reviewing her current and  previous labs and insulin doses and developing a plan to avoid hypo- and hyper-glycemia. Please refer to Patient Instructions for Blood Glucose Monitoring and Insulin/Medications Dosing Guide"  in media tab for additional information. Stacy [name] participated in the discussions, expressed understanding, and voiced agreement with the above plans.  All questions were answered to her satisfaction. she is encouraged to contact clinic should she have any questions or concerns prior to her return visit.  Follow up plan: -Return in about 3 months (around 08/30/2018) for Meter, and Logs.  Glade Lloyd, MD Phone: [phone]  Fax: [phone]  This note was partially dictated with voice recognition  software. Similar sounding words can be transcribed inadequately or may not  be corrected upon review.  05/30/2018, 4:35 PM

## 2018-05-31 DIAGNOSIS — S42201D Unspecified fracture of upper end of right humerus, subsequent encounter for fracture with routine healing: Secondary | ICD-10-CM | POA: Diagnosis not present

## 2018-05-31 DIAGNOSIS — M25511 Pain in right shoulder: Secondary | ICD-10-CM | POA: Diagnosis not present

## 2018-06-01 DIAGNOSIS — M9902 Segmental and somatic dysfunction of thoracic region: Secondary | ICD-10-CM | POA: Diagnosis not present

## 2018-06-01 DIAGNOSIS — M545 Low back pain: Secondary | ICD-10-CM | POA: Diagnosis not present

## 2018-06-01 DIAGNOSIS — M9905 Segmental and somatic dysfunction of pelvic region: Secondary | ICD-10-CM | POA: Diagnosis not present

## 2018-06-01 DIAGNOSIS — M9903 Segmental and somatic dysfunction of lumbar region: Secondary | ICD-10-CM | POA: Diagnosis not present

## 2018-06-07 DIAGNOSIS — I10 Essential (primary) hypertension: Secondary | ICD-10-CM | POA: Diagnosis not present

## 2018-06-07 DIAGNOSIS — M545 Low back pain: Secondary | ICD-10-CM | POA: Diagnosis not present

## 2018-06-07 DIAGNOSIS — E1165 Type 2 diabetes mellitus with hyperglycemia: Secondary | ICD-10-CM | POA: Diagnosis not present

## 2018-06-07 DIAGNOSIS — S42301A Unspecified fracture of shaft of humerus, right arm, initial encounter for closed fracture: Secondary | ICD-10-CM | POA: Diagnosis not present

## 2018-06-14 DIAGNOSIS — S42201D Unspecified fracture of upper end of right humerus, subsequent encounter for fracture with routine healing: Secondary | ICD-10-CM | POA: Diagnosis not present

## 2018-06-14 DIAGNOSIS — M25511 Pain in right shoulder: Secondary | ICD-10-CM | POA: Diagnosis not present

## 2018-07-04 DIAGNOSIS — M9902 Segmental and somatic dysfunction of thoracic region: Secondary | ICD-10-CM | POA: Diagnosis not present

## 2018-07-04 DIAGNOSIS — M9905 Segmental and somatic dysfunction of pelvic region: Secondary | ICD-10-CM | POA: Diagnosis not present

## 2018-07-04 DIAGNOSIS — M5441 Lumbago with sciatica, right side: Secondary | ICD-10-CM | POA: Diagnosis not present

## 2018-07-04 DIAGNOSIS — M9903 Segmental and somatic dysfunction of lumbar region: Secondary | ICD-10-CM | POA: Diagnosis not present

## 2018-08-01 ENCOUNTER — Ambulatory Visit: Payer: Medicare Other | Admitting: "Endocrinology

## 2018-08-08 DIAGNOSIS — E1159 Type 2 diabetes mellitus with other circulatory complications: Secondary | ICD-10-CM | POA: Diagnosis not present

## 2018-08-09 LAB — COMPLETE METABOLIC PANEL WITH GFR
AG Ratio: 1.3 (calc) (ref 1.0–2.5)
ALKALINE PHOSPHATASE (APISO): 46 U/L (ref 33–130)
ALT: 17 U/L (ref 6–29)
AST: 16 U/L (ref 10–35)
Albumin: 4.2 g/dL (ref 3.6–5.1)
BUN: 12 mg/dL (ref 7–25)
CALCIUM: 9.7 mg/dL (ref 8.6–10.4)
CO2: 29 mmol/L (ref 20–32)
CREATININE: 0.75 mg/dL (ref 0.50–0.99)
Chloride: 104 mmol/L (ref 98–110)
GFR, Est African American: 98 mL/min/{1.73_m2} (ref >=60)
GFR, Est Non African American: 85 mL/min/{1.73_m2} (ref >=60)
GLOBULIN: 3.2 g/dL (ref 1.9–3.7)
GLUCOSE: 73 mg/dL (ref 65–139)
Potassium: 3.9 mmol/L (ref 3.5–5.3)
SODIUM: 139 mmol/L (ref 135–146)
Total Bilirubin: 0.6 mg/dL (ref 0.2–1.2)
Total Protein: 7.4 g/dL (ref 6.1–8.1)

## 2018-08-09 LAB — HEMOGLOBIN A1C
Hgb A1c MFr Bld: 9.5 % of total Hgb — ABNORMAL HIGH (ref <5.7)
Mean Plasma Glucose: 226 (calc)
eAG (mmol/L): 12.5 (calc)

## 2018-08-18 ENCOUNTER — Ambulatory Visit (INDEPENDENT_AMBULATORY_CARE_PROVIDER_SITE_OTHER): Payer: Medicare Other | Admitting: "Endocrinology

## 2018-08-18 ENCOUNTER — Encounter: Payer: Self-pay | Admitting: "Endocrinology

## 2018-08-18 VITALS — BP 143/85 | HR 70 | Ht 61.0 in | Wt 198.0 lb

## 2018-08-18 DIAGNOSIS — E1159 Type 2 diabetes mellitus with other circulatory complications: Secondary | ICD-10-CM | POA: Diagnosis not present

## 2018-08-18 DIAGNOSIS — I10 Essential (primary) hypertension: Secondary | ICD-10-CM

## 2018-08-18 DIAGNOSIS — E782 Mixed hyperlipidemia: Secondary | ICD-10-CM | POA: Diagnosis not present

## 2018-08-18 MED ORDER — EVOLOCUMAB 140 MG/ML ~~LOC~~ SOAJ: 140.0000 mg | SUBCUTANEOUS | 3 refills | Status: DC

## 2018-08-18 MED ORDER — INSULIN NPH ISOPHANE & REGULAR (70-30) 100 UNIT/ML ~~LOC~~ SUSP: SUBCUTANEOUS | 2 refills | Status: DC

## 2018-08-18 NOTE — Progress Notes (Signed)
Endocrinology follow-up note  Subjective:    Patient ID: Stacy [name], female    DOB: Jul 26, 1956, PCP Sinda Du, MD   Past Medical History:  Diagnosis Date  . Chronic back pain   . Diabetes mellitus   . HTN (hypertension)   . Hyperlipidemia   . Osteoarthritis   . Stroke [facility])    Past Surgical History:  Procedure Laterality Date  . carpel tunnel release     bilateral  . CESAREAN SECTION    . COLONOSCOPY  2006   normal, Dr. Benson [place]  . ESOPHAGOGASTRODUODENOSCOPY  2006   normal, Dr. Benson [place]   Social History   Socioeconomic History  . Marital status: Divorced    Spouse name: Not on file  . Number of children: Not on file  . Years of education: Not on file  . Highest education level: Not on file  Occupational History    Employer: [employer]    Comment: out due to back injury  Social Needs  . Financial resource strain: Not on file  . Food insecurity:    Worry: Not on file    Inability: Not on file  . Transportation needs:    Medical: Not on file    Non-medical: Not on file  Tobacco Use  . Smoking status: Never Smoker  . Smokeless tobacco: Never Used  Substance and Sexual Activity  . Alcohol use: No    Alcohol/week: 0.0 standard drinks  . Drug use: No  . Sexual activity: Never  Lifestyle  . Physical activity:    Days per week: Not on file    Minutes per session: Not on file  . Stress: Not on file  Relationships  . Social connections:    Talks on phone: Not on file    Gets together: Not on file    Attends religious service: Not on file    Active member of club or organization: Not on file    Attends meetings of clubs or organizations: Not on file    Relationship status: Not on file  Other Topics Concern  . Not on file  Social History Narrative  . Not on file   Outpatient Encounter Medications as of 08/18/2018  Medication Sig  . amLODipine (NORVASC) 5 MG tablet Take 5 mg by mouth daily.  . B-D UF III MINI PEN NEEDLES 31G X 5 MM MISC AS DIRECTED  DX:250.0 (Patient not taking: Reported on 05/10/2018)  . Evolocumab (REPATHA SURECLICK) 962 MG/ML SOAJ Inject 140 mg into the skin every 14 (fourteen) days.  Marland Kitchen glucose blood (FREESTYLE TEST STRIPS) test strip USE TO TEST BLOOD SUGAR FOUR TIMES DAILY AS DIRECTED  . insulin NPH-regular Human (NOVOLIN 70/30) (70-30) 100 UNIT/ML injection Inject 35 units with breakfast and 35  units with supper when blood glucose readings are above 90 mg/dL.  . INSULIN SYRINGE .5CC/29G 29G X 1/2" 0.5 ML MISC Use to inject insulin 2 times a day  . losartan-hydrochlorothiazide (HYZAAR) 50-12.5 MG tablet Take 1 tablet by mouth daily.  . [DISCONTINUED] baclofen (LIORESAL) 10 MG tablet Reported on 02/17/2016  . [DISCONTINUED] benzonatate (TESSALON) 200 MG capsule 3 (three) times daily.  . [DISCONTINUED] Cholecalciferol (VITAMIN D3) 5000 units CAPS Take 1 capsule (5,000 Units total) by mouth daily.  . [DISCONTINUED] clopidogrel (PLAVIX) 75 MG tablet Take 75 mg by mouth daily.  . [DISCONTINUED] insulin NPH-regular Human (NOVOLIN 70/30) (70-30) 100 UNIT/ML injection Inject 30 units with breakfast and 30 units with supper when blood glucose readings are above 90  mg/dL.   No facility-administered encounter medications on file as of 08/18/2018.    ALLERGIES: No Known Allergies VACCINATION STATUS: Immunization History  Administered Date(s) Administered  . Influenza Whole 07/13/2007, 08/07/2009  . Pneumococcal Polysaccharide-23 04/22/2012  . Td 01/11/2007    Diabetes  She presents for her follow-up diabetic visit. She has type 2 diabetes mellitus. Onset time: She was diagnosed at approximate age of 29 years. Her disease course has been improving. There are no hypoglycemic associated symptoms. Pertinent negatives for hypoglycemia include no confusion, headaches, pallor or seizures. Pertinent negatives for diabetes include no chest pain, no polydipsia, no polyphagia and no polyuria. There are no hypoglycemic complications.  Symptoms are improving. Diabetic complications include a CVA and nephropathy. Risk factors for coronary artery disease include diabetes mellitus, dyslipidemia, hypertension, obesity and sedentary lifestyle. Current diabetic treatment includes insulin injections. Compliance with diabetes treatment: She has history of noncompliance . Her weight is increasing steadily. She is following a generally unhealthy diet. When asked about meal planning, she reported none. She has not had a previous visit with a dietitian. She never (She has chronic back problem.) participates in exercise. She monitors blood glucose at home 3-4 x per day. Blood glucose monitoring compliance is fair. Her home blood glucose trend is fluctuating dramatically. Her breakfast blood glucose range is generally 180-200 mg/dl. Her overall blood glucose range is 180-200 mg/dl. An ACE inhibitor/angiotensin II receptor blocker is being taken.  Hyperlipidemia  This is a chronic problem. The current episode started more than 1 year ago. The problem is uncontrolled. Exacerbating diseases include diabetes and obesity. Pertinent negatives include no chest pain, leg pain, myalgias or shortness of breath. Current antihyperlipidemic treatment includes statins. Compliance problems: She had history of noncompliance.  Risk factors for coronary artery disease include dyslipidemia, diabetes mellitus, hypertension, obesity and a sedentary lifestyle.  Hypertension  This is a chronic problem. The current episode started more than 1 year ago. The problem is uncontrolled. Pertinent negatives include no chest pain, headaches, palpitations or shortness of breath. Risk factors for coronary artery disease include diabetes mellitus, dyslipidemia, obesity and sedentary lifestyle. Past treatments include angiotensin blockers. Hypertensive end-organ damage includes CVA.    Review of Systems  Constitutional: Negative for unexpected weight change.  HENT: Negative for trouble  swallowing and voice change.   Eyes: Negative for visual disturbance.  Respiratory: Negative for cough, shortness of breath and wheezing.   Cardiovascular: Negative for chest pain, palpitations and leg swelling.  Gastrointestinal: Negative for diarrhea, nausea and vomiting.  Endocrine: Negative for cold intolerance, heat intolerance, polydipsia, polyphagia and polyuria.  Musculoskeletal: Negative for arthralgias and myalgias.  Skin: Negative for color change, pallor, rash and wound.  Neurological: Negative for seizures and headaches.  Psychiatric/Behavioral: Negative for confusion and suicidal ideas.    Objective:    BP (!) 143/85   Pulse 70   Ht 5\' 1"  (1.549 m)   Wt 198 lb (89.8 kg)   BMI 37.41 kg/m   Wt Readings from Last 3 Encounters:  08/18/18 198 lb (89.8 kg)  05/30/18 193 lb (87.5 kg)  05/10/18 191 lb 3.2 oz (86.7 kg)    Physical Exam  Constitutional: She is oriented to person, place, and time. She appears well-developed.  HENT:  Head: Normocephalic and atraumatic.  Eyes: EOM are normal.  Neck: Normal range of motion. Neck supple. No tracheal deviation present. No thyromegaly present.  Cardiovascular: Normal rate.  Pulmonary/Chest: Effort normal.  Abdominal: There is no tenderness. There is no guarding.  Musculoskeletal: Normal range of motion. She exhibits no edema.  Neurological: She is alert and oriented to person, place, and time. No cranial nerve deficit. Coordination normal.  Skin: Skin is warm and dry. No rash noted. No erythema. No pallor.  Psychiatric: She has a normal mood and affect.    Results for orders placed or performed in visit on 05/30/18  Hemoglobin A1c  Result Value Ref Range   Hgb A1c MFr Bld 9.5 (H) <5.7 % of total Hgb   Mean Plasma Glucose 226 (calc)   eAG (mmol/L) 12.5 (calc)  COMPLETE METABOLIC PANEL WITH GFR  Result Value Ref Range   Glucose, Bld 73 65 - 139 mg/dL   BUN 12 7 - 25 mg/dL   Creat 0.75 0.50 - 0.99 mg/dL   GFR, Est Non  African American 85 > OR = 60 mL/min/1.13m2   GFR, Est African American 98 > OR = 60 mL/min/1.21m2   BUN/Creatinine Ratio NOT APPLICABLE 6 - 22 (calc)   Sodium 139 135 - 146 mmol/L   Potassium 3.9 3.5 - 5.3 mmol/L   Chloride 104 98 - 110 mmol/L   CO2 29 20 - 32 mmol/L   Calcium 9.7 8.6 - 10.4 mg/dL   Total Protein 7.4 6.1 - 8.1 g/dL   Albumin 4.2 3.6 - 5.1 g/dL   Globulin 3.2 1.9 - 3.7 g/dL (calc)   AG Ratio 1.3 1.0 - 2.5 (calc)   Total Bilirubin 0.6 0.2 - 1.2 mg/dL   Alkaline phosphatase (APISO) 46 33 - 130 U/L   AST 16 10 - 35 U/L   ALT 17 6 - 29 U/L   Diabetic Labs (most recent): Lab Results  Component Value Date   HGBA1C 9.5 (H) 08/08/2018   HGBA1C 10.7 (H) 05/04/2018   HGBA1C 9.3 (H) 12/28/2017   Lipid Panel     Component Value Date/Time   CHOL 213 (H) 09/30/2017 0915   TRIG 99 09/30/2017 0915   HDL 44 (L) 09/30/2017 0915   CHOLHDL 4.8 09/30/2017 0915   VLDL 25 07/23/2016 0856   LDLCALC 148 (H) 09/30/2017 0915   LDLDIRECT 135.5 05/02/2012 0945      Assessment & Plan:   1. Uncontrolled type 2 diabetes mellitus with other circulatory complication, with long-term current use of insulin ([place]) - her diabetes is  complicated by carotid artery stenosis and mild renal insufficiency, and history of noncompliance / non-aherence, patient remains at extremely high risk for more acute and chronic complications of diabetes which include CAD, CVA, CKD, retinopathy, and neuropathy. These are all discussed in detail with the patient.  -She  came with improving glycemic profile, better consistency on an insulin injection since her last visit.  Her revisit labs show A1c of 9.5% improving from 10.7%.     - Her recent labs reviewed.  - I have re-counseled the patient on diet management and weight loss  by adopting a carbohydrate restricted / protein rich  Diet.  -  Suggestion is made for her to avoid simple carbohydrates  from her diet including Cakes, Sweet Desserts / Pastries, Ice  Cream, Soda (diet and regular), Sweet Tea, Candies, Chips, Cookies, Store Bought Juices, Alcohol in Excess of  1-2 drinks a day, Artificial Sweeteners, and "Sugar-free" Products. This will help patient to have stable blood glucose profile and potentially avoid unintended weight gain.  - Patient is advised to stick to a routine mealtimes to eat 3 meals  a day and avoid unnecessary snacks ( to snack only to correct  hypoglycemia).  - I have approached patient with the following individualized plan to manage diabetes and patient agrees.  -She has chronically uncontrolled type 2 diabetes worsened by her chronic  noncompliance /nonadherence and patient still easily disengages from self-care. -She has trouble affording co-pays for insulin analogs, however, she did reasonably well on Novolin 70/30 , and wishes to stay on this regimen for cost reasons. -She is advised to increase her Novolin  70/30  To 35 units with breakfast and 35 units with supper for  pre-meal blood glucose above 90 mg/dL. -She is advised to start monitoring blood glucose 4 times a day-before meals and at bedtime .   -If her glycemic profile is not satisfactory, she will be considered for insulin analogs.   - She would have benefited from continuous glucose monitoring, however her insurance did not provide coverage for the prescription.   -Adjustment parameters for hypo and hyperglycemia were given in a written document to patient. -Patient is encouraged to call clinic for blood glucose levels less than 70 or above 300 mg /dl. - She does not tolerate metformin.   - Patient specific target  for A1c; LDL, HDL, Triglycerides, and  Waist Circumference were discussed in detail.  2) BP/HTN: Her blood pressure is not controlled to target.   She is advised to continue her current blood pressure medications including losartan-hydrochlorothiazide, amlodipine.    3) Lipids/HPL: She has severe dyslipidemia, LDLnot controlled to target.  She  has not been taking her statins due to diffuse arthralgias as a side effect of atorvastatin.  I discussed and prescribed Repatha 140 mg subcutaneously every 2 weeks, if her insurance provides coverage.     4)  Weight/Diet:  CDE consult has been requested, exercise, and carbohydrates information provided.  5) Chronic Care/Health Maintenance:  -Patient  is  on ACEI/ARB and Statin medications and encouraged to continue to follow up with Ophthalmology, Podiatrist at least yearly or according to recommendations, and advised to  stay away from smoking. I have recommended yearly flu vaccine and pneumonia vaccination at least every 5 years; moderate intensity exercise for up to 150 minutes weekly; and  sleep for at least 7 hours a day.  - I advised patient to maintain close follow up with Sinda Du, MD for primary care needs.   - Time spent with the patient: 25 min, of which >50% was spent in reviewing her blood glucose logs , discussing her hypo- and hyper-glycemic episodes, reviewing her current and  previous labs and insulin doses and developing a plan to avoid hypo- and hyper-glycemia. Please refer to Patient Instructions for Blood Glucose Monitoring and Insulin/Medications Dosing Guide"  in media tab for additional information. Stacy [name] participated in the discussions, expressed understanding, and voiced agreement with the above plans.  All questions were answered to her satisfaction. she is encouraged to contact clinic should she have any questions or concerns prior to her return visit.   Follow up plan: -Return in about 4 months (around 12/17/2018) for Follow up with Pre-visit Labs, Meter, and Logs.  Glade Lloyd, MD Phone: [phone]  Fax: [phone]  This note was partially dictated with voice recognition software. Similar sounding words can be transcribed inadequately or may not  be corrected upon review.  08/18/2018, 1:30 PM

## 2018-08-18 NOTE — Patient Instructions (Signed)

## 2018-08-19 DIAGNOSIS — M9905 Segmental and somatic dysfunction of pelvic region: Secondary | ICD-10-CM | POA: Diagnosis not present

## 2018-08-19 DIAGNOSIS — M9903 Segmental and somatic dysfunction of lumbar region: Secondary | ICD-10-CM | POA: Diagnosis not present

## 2018-08-19 DIAGNOSIS — M9902 Segmental and somatic dysfunction of thoracic region: Secondary | ICD-10-CM | POA: Diagnosis not present

## 2018-08-19 DIAGNOSIS — M5441 Lumbago with sciatica, right side: Secondary | ICD-10-CM | POA: Diagnosis not present

## 2018-08-22 DIAGNOSIS — E119 Type 2 diabetes mellitus without complications: Secondary | ICD-10-CM | POA: Diagnosis not present

## 2018-08-22 DIAGNOSIS — Z23 Encounter for immunization: Secondary | ICD-10-CM | POA: Diagnosis not present

## 2018-08-22 DIAGNOSIS — J301 Allergic rhinitis due to pollen: Secondary | ICD-10-CM | POA: Diagnosis not present

## 2018-08-22 DIAGNOSIS — I10 Essential (primary) hypertension: Secondary | ICD-10-CM | POA: Diagnosis not present

## 2018-08-22 DIAGNOSIS — M5431 Sciatica, right side: Secondary | ICD-10-CM | POA: Diagnosis not present

## 2018-08-24 DIAGNOSIS — M9903 Segmental and somatic dysfunction of lumbar region: Secondary | ICD-10-CM | POA: Diagnosis not present

## 2018-08-24 DIAGNOSIS — M9905 Segmental and somatic dysfunction of pelvic region: Secondary | ICD-10-CM | POA: Diagnosis not present

## 2018-08-24 DIAGNOSIS — M5441 Lumbago with sciatica, right side: Secondary | ICD-10-CM | POA: Diagnosis not present

## 2018-08-24 DIAGNOSIS — M9902 Segmental and somatic dysfunction of thoracic region: Secondary | ICD-10-CM | POA: Diagnosis not present

## 2018-08-31 DIAGNOSIS — M9903 Segmental and somatic dysfunction of lumbar region: Secondary | ICD-10-CM | POA: Diagnosis not present

## 2018-08-31 DIAGNOSIS — M9905 Segmental and somatic dysfunction of pelvic region: Secondary | ICD-10-CM | POA: Diagnosis not present

## 2018-08-31 DIAGNOSIS — M5441 Lumbago with sciatica, right side: Secondary | ICD-10-CM | POA: Diagnosis not present

## 2018-08-31 DIAGNOSIS — M9902 Segmental and somatic dysfunction of thoracic region: Secondary | ICD-10-CM | POA: Diagnosis not present

## 2018-09-14 DIAGNOSIS — M9902 Segmental and somatic dysfunction of thoracic region: Secondary | ICD-10-CM | POA: Diagnosis not present

## 2018-09-14 DIAGNOSIS — M9905 Segmental and somatic dysfunction of pelvic region: Secondary | ICD-10-CM | POA: Diagnosis not present

## 2018-09-14 DIAGNOSIS — M9903 Segmental and somatic dysfunction of lumbar region: Secondary | ICD-10-CM | POA: Diagnosis not present

## 2018-09-14 DIAGNOSIS — M5441 Lumbago with sciatica, right side: Secondary | ICD-10-CM | POA: Diagnosis not present

## 2018-09-21 ENCOUNTER — Other Ambulatory Visit: Payer: Self-pay

## 2018-09-21 DIAGNOSIS — M9902 Segmental and somatic dysfunction of thoracic region: Secondary | ICD-10-CM | POA: Diagnosis not present

## 2018-09-21 DIAGNOSIS — M9905 Segmental and somatic dysfunction of pelvic region: Secondary | ICD-10-CM | POA: Diagnosis not present

## 2018-09-21 DIAGNOSIS — M9903 Segmental and somatic dysfunction of lumbar region: Secondary | ICD-10-CM | POA: Diagnosis not present

## 2018-09-21 DIAGNOSIS — M5441 Lumbago with sciatica, right side: Secondary | ICD-10-CM | POA: Diagnosis not present

## 2018-09-21 MED ORDER — INSULIN NPH ISOPHANE & REGULAR (70-30) 100 UNIT/ML ~~LOC~~ SUSP: SUBCUTANEOUS | 2 refills | Status: DC

## 2018-09-28 DIAGNOSIS — M5441 Lumbago with sciatica, right side: Secondary | ICD-10-CM | POA: Diagnosis not present

## 2018-09-28 DIAGNOSIS — M9903 Segmental and somatic dysfunction of lumbar region: Secondary | ICD-10-CM | POA: Diagnosis not present

## 2018-09-28 DIAGNOSIS — M9902 Segmental and somatic dysfunction of thoracic region: Secondary | ICD-10-CM | POA: Diagnosis not present

## 2018-09-28 DIAGNOSIS — M9905 Segmental and somatic dysfunction of pelvic region: Secondary | ICD-10-CM | POA: Diagnosis not present

## 2018-11-04 ENCOUNTER — Other Ambulatory Visit: Payer: Self-pay | Admitting: "Endocrinology

## 2018-11-22 DIAGNOSIS — E1165 Type 2 diabetes mellitus with hyperglycemia: Secondary | ICD-10-CM | POA: Diagnosis not present

## 2018-11-22 DIAGNOSIS — M545 Low back pain: Secondary | ICD-10-CM | POA: Diagnosis not present

## 2018-11-22 DIAGNOSIS — M25551 Pain in right hip: Secondary | ICD-10-CM | POA: Diagnosis not present

## 2018-11-22 DIAGNOSIS — I10 Essential (primary) hypertension: Secondary | ICD-10-CM | POA: Diagnosis not present

## 2018-12-05 ENCOUNTER — Other Ambulatory Visit (HOSPITAL_COMMUNITY): Payer: Self-pay | Admitting: Pulmonary Disease

## 2018-12-05 ENCOUNTER — Ambulatory Visit (HOSPITAL_COMMUNITY)
Admission: RE | Admit: 2018-12-05 | Discharge: 2018-12-05 | Disposition: A | Discharge status: Home / Self Care | Payer: Medicare Other | Source: Ambulatory Visit | Attending: Pulmonary Disease | Admitting: Pulmonary Disease

## 2018-12-05 ENCOUNTER — Encounter (HOSPITAL_COMMUNITY): Payer: Self-pay

## 2018-12-05 DIAGNOSIS — M25551 Pain in right hip: Secondary | ICD-10-CM

## 2018-12-05 DIAGNOSIS — M1611 Unilateral primary osteoarthritis, right hip: Secondary | ICD-10-CM | POA: Diagnosis not present

## 2018-12-05 DIAGNOSIS — M545 Low back pain, unspecified: Secondary | ICD-10-CM

## 2018-12-05 DIAGNOSIS — M4805 Spinal stenosis, thoracolumbar region: Secondary | ICD-10-CM | POA: Diagnosis not present

## 2018-12-20 ENCOUNTER — Ambulatory Visit: Payer: Medicare Other | Admitting: "Endocrinology

## 2018-12-21 DIAGNOSIS — M9903 Segmental and somatic dysfunction of lumbar region: Secondary | ICD-10-CM | POA: Diagnosis not present

## 2018-12-21 DIAGNOSIS — M9902 Segmental and somatic dysfunction of thoracic region: Secondary | ICD-10-CM | POA: Diagnosis not present

## 2018-12-21 DIAGNOSIS — M545 Low back pain: Secondary | ICD-10-CM | POA: Diagnosis not present

## 2018-12-21 DIAGNOSIS — M9905 Segmental and somatic dysfunction of pelvic region: Secondary | ICD-10-CM | POA: Diagnosis not present

## 2019-01-06 ENCOUNTER — Other Ambulatory Visit: Payer: Self-pay | Admitting: "Endocrinology

## 2019-01-25 DIAGNOSIS — E1165 Type 2 diabetes mellitus with hyperglycemia: Secondary | ICD-10-CM | POA: Diagnosis not present

## 2019-01-25 DIAGNOSIS — I10 Essential (primary) hypertension: Secondary | ICD-10-CM | POA: Diagnosis not present

## 2019-01-25 DIAGNOSIS — M25551 Pain in right hip: Secondary | ICD-10-CM | POA: Diagnosis not present

## 2019-01-25 DIAGNOSIS — M545 Low back pain: Secondary | ICD-10-CM | POA: Diagnosis not present

## 2019-01-25 LAB — TSH: TSH: 1.55 (ref <=5.90)

## 2019-01-25 LAB — BASIC METABOLIC PANEL
BUN: 13 (ref 4–21)
Creatinine: 0.7 (ref <=1.1)
Glucose: 164

## 2019-01-25 LAB — COMPREHENSIVE METABOLIC PANEL
GFR calc Af Amer: 103
GFR calc non Af Amer: 89

## 2019-01-25 LAB — CBC AND DIFFERENTIAL
HCT: 40 (ref 36–46)
Hemoglobin: 13.1 (ref 12.0–16.0)
Neutrophils Absolute: 1867
Platelets: 324 (ref 150–399)
WBC: 5.1

## 2019-01-25 LAB — CBC: RBC: 4.48 (ref 3.87–5.11)

## 2019-01-25 LAB — HEPATIC FUNCTION PANEL
Alkaline Phosphatase: 51 (ref 25–125)
Bilirubin, Total: 0.6

## 2019-02-10 ENCOUNTER — Other Ambulatory Visit: Payer: Self-pay | Admitting: "Endocrinology

## 2019-02-20 DIAGNOSIS — M9903 Segmental and somatic dysfunction of lumbar region: Secondary | ICD-10-CM | POA: Diagnosis not present

## 2019-02-20 DIAGNOSIS — M5441 Lumbago with sciatica, right side: Secondary | ICD-10-CM | POA: Diagnosis not present

## 2019-02-20 DIAGNOSIS — M9902 Segmental and somatic dysfunction of thoracic region: Secondary | ICD-10-CM | POA: Diagnosis not present

## 2019-02-20 DIAGNOSIS — M9905 Segmental and somatic dysfunction of pelvic region: Secondary | ICD-10-CM | POA: Diagnosis not present

## 2019-02-21 DIAGNOSIS — M545 Low back pain: Secondary | ICD-10-CM | POA: Diagnosis not present

## 2019-02-21 DIAGNOSIS — E1165 Type 2 diabetes mellitus with hyperglycemia: Secondary | ICD-10-CM | POA: Diagnosis not present

## 2019-02-21 DIAGNOSIS — I10 Essential (primary) hypertension: Secondary | ICD-10-CM | POA: Diagnosis not present

## 2019-02-21 DIAGNOSIS — K210 Gastro-esophageal reflux disease with esophagitis: Secondary | ICD-10-CM | POA: Diagnosis not present

## 2019-04-17 ENCOUNTER — Other Ambulatory Visit: Payer: Self-pay | Admitting: "Endocrinology

## 2019-04-26 ENCOUNTER — Telehealth: Payer: Self-pay

## 2019-04-26 DIAGNOSIS — E1159 Type 2 diabetes mellitus with other circulatory complications: Secondary | ICD-10-CM | POA: Diagnosis not present

## 2019-04-26 DIAGNOSIS — E782 Mixed hyperlipidemia: Secondary | ICD-10-CM

## 2019-04-26 MED ORDER — NOVOLIN 70/30 (70-30) 100 UNIT/ML ~~LOC~~ SUSP: SUBCUTANEOUS | 2 refills | Status: DC

## 2019-04-26 NOTE — Telephone Encounter (Signed)
LeighAnn Quashaun Lazalde, CMA  

## 2019-04-26 NOTE — Telephone Encounter (Signed)
LeighAnn Emanual Lamountain, CMA  

## 2019-04-27 LAB — HEMOGLOBIN A1C
Hgb A1c MFr Bld: 9 % of total Hgb — ABNORMAL HIGH (ref <5.7)
Mean Plasma Glucose: 212 (calc)
eAG (mmol/L): 11.7 (calc)

## 2019-04-27 LAB — COMPLETE METABOLIC PANEL WITH GFR
AG Ratio: 1.4 (calc) (ref 1.0–2.5)
ALT: 16 U/L (ref 6–29)
AST: 14 U/L (ref 10–35)
Albumin: 4.2 g/dL (ref 3.6–5.1)
Alkaline phosphatase (APISO): 50 U/L (ref 37–153)
BUN: 13 mg/dL (ref 7–25)
CO2: 28 mmol/L (ref 20–32)
Calcium: 9.3 mg/dL (ref 8.6–10.4)
Chloride: 101 mmol/L (ref 98–110)
Creat: 0.81 mg/dL (ref 0.50–0.99)
GFR, Est African American: 89 mL/min/{1.73_m2} (ref >=60)
GFR, Est Non African American: 77 mL/min/{1.73_m2} (ref >=60)
Globulin: 3 g/dL (calc) (ref 1.9–3.7)
Glucose, Bld: 270 mg/dL — ABNORMAL HIGH (ref 65–139)
Potassium: 4.2 mmol/L (ref 3.5–5.3)
Sodium: 136 mmol/L (ref 135–146)
Total Bilirubin: 0.5 mg/dL (ref 0.2–1.2)
Total Protein: 7.2 g/dL (ref 6.1–8.1)

## 2019-05-08 ENCOUNTER — Encounter: Payer: Self-pay | Admitting: "Endocrinology

## 2019-05-08 ENCOUNTER — Other Ambulatory Visit: Payer: Self-pay

## 2019-05-08 ENCOUNTER — Ambulatory Visit (INDEPENDENT_AMBULATORY_CARE_PROVIDER_SITE_OTHER): Payer: Medicare Other | Admitting: "Endocrinology

## 2019-05-08 DIAGNOSIS — E1159 Type 2 diabetes mellitus with other circulatory complications: Secondary | ICD-10-CM | POA: Diagnosis not present

## 2019-05-08 DIAGNOSIS — I10 Essential (primary) hypertension: Secondary | ICD-10-CM

## 2019-05-08 DIAGNOSIS — E782 Mixed hyperlipidemia: Secondary | ICD-10-CM | POA: Diagnosis not present

## 2019-05-08 NOTE — Progress Notes (Signed)
05/08/2019                                                    Endocrinology Telehealth Visit Follow up Note -During COVID -19 Pandemic  This visit type was conducted due to national recommendations for restrictions regarding the COVID-19 Pandemic  in an effort to limit this patient's exposure and mitigate transmission of the corona virus.  Due to her co-morbid illnesses, Stacy [name] is at  moderate to high risk for complications without adequate follow up.  This format is felt to be most appropriate for her at this time.  I connected with this patient on 05/08/2019   by telephone and verified that I am speaking with the correct person using two identifiers. Stacy [name], Jul 01, 1956. she has verbally consented to this visit. All issues noted in this document were discussed and addressed. The format was not optimal for physical exam.    Subjective:    Patient ID: Stacy [name], female    DOB: September 27, 1956, PCP Sinda Du, MD   Past Medical History:  Diagnosis Date  . Chronic back pain   . Diabetes mellitus   . HTN (hypertension)   . Hyperlipidemia   . Osteoarthritis   . Stroke [facility])    Past Surgical History:  Procedure Laterality Date  . carpel tunnel release     bilateral  . CESAREAN SECTION    . COLONOSCOPY  2006   normal, Dr. Benson Norway  . ESOPHAGOGASTRODUODENOSCOPY  2006   normal, Dr. Benson Norway   Social History   Socioeconomic History  . Marital status: Divorced    Spouse name: Not on file  . Number of children: Not on file  . Years of education: Not on file  . Highest education level: Not on file  Occupational History    Employer: [employer]    Comment: out due to back injury  Social Needs  . Financial resource strain: Not on file  . Food insecurity    Worry: Not on file    Inability: Not on file  . Transportation needs    Medical: Not on file    Non-medical: Not on file  Tobacco Use  . Smoking status: Never Smoker  . Smokeless tobacco: Never Used  Substance  and Sexual Activity  . Alcohol use: No    Alcohol/week: 0.0 standard drinks  . Drug use: No  . Sexual activity: Never  Lifestyle  . Physical activity    Days per week: Not on file    Minutes per session: Not on file  . Stress: Not on file  Relationships  . Social Herbalist on phone: Not on file    Gets together: Not on file    Attends religious service: Not on file    Active member of club or organization: Not on file    Attends meetings of clubs or organizations: Not on file    Relationship status: Not on file  Other Topics Concern  . Not on file  Social History Narrative  . Not on file   Outpatient Encounter Medications as of 05/08/2019  Medication Sig  . amLODipine (NORVASC) 5 MG tablet Take 5 mg by mouth daily.  . B-D UF III MINI PEN NEEDLES 31G X 5 MM MISC AS DIRECTED DX:250.0 (Patient not taking: Reported on 05/10/2018)  .  Evolocumab (REPATHA SURECLICK) 638 MG/ML SOAJ Inject 140 mg into the skin every 14 (fourteen) days.  Marland Kitchen glucose blood (FREESTYLE TEST STRIPS) test strip USE TO TEST BLOOD SUGAR FOUR TIMES DAILY AS DIRECTED.  Marland Kitchen insulin NPH-regular Human (NOVOLIN 70/30) (70-30) 100 UNIT/ML injection INJECT 35 UNITS INTO THE SKIN WITH BREAKFAST AND 35 UNITS WITH SUPPER WHEN BLOOD GLUCOSE READINGS ARE ABOVE 90 MG/DL  . INSULIN SYRINGE .5CC/29G 29G X 1/2" 0.5 ML MISC USE TO INJECT INSULIN TWICE DAILY  . losartan-hydrochlorothiazide (HYZAAR) 50-12.5 MG tablet Take 1 tablet by mouth daily.   No facility-administered encounter medications on file as of 05/08/2019.    ALLERGIES: No Known Allergies VACCINATION STATUS: Immunization History  Administered Date(s) Administered  . Influenza Whole 07/13/2007, 08/07/2009  . Pneumococcal Polysaccharide-23 04/22/2012  . Td 01/11/2007    Diabetes She presents for her follow-up diabetic visit. She has type 2 diabetes mellitus. Onset time: She was diagnosed at approximate age of 40 years. Her disease course has been improving.  There are no hypoglycemic associated symptoms. Pertinent negatives for hypoglycemia include no confusion, headaches, pallor or seizures. Pertinent negatives for diabetes include no chest pain, no polydipsia, no polyphagia and no polyuria. There are no hypoglycemic complications. Symptoms are improving. Diabetic complications include a CVA and nephropathy. Risk factors for coronary artery disease include diabetes mellitus, dyslipidemia, hypertension, obesity and sedentary lifestyle. Current diabetic treatment includes insulin injections. Compliance with diabetes treatment: She has history of noncompliance . Her weight is increasing steadily. She is following a generally unhealthy diet. When asked about meal planning, she reported none. She has not had a previous visit with a dietitian. She never (She has chronic back problem.) participates in exercise. She monitors blood glucose at home 1-2 x per day. Blood glucose monitoring compliance is inadequate. Her home blood glucose trend is fluctuating dramatically. Her breakfast blood glucose range is generally 140-180 mg/dl. Her overall blood glucose range is 140-180 mg/dl. (She monitors 2 times a day averaging 148 mg per DL in the last 90 days.  Her labs show an A1c of 9%, progressively improving from 10.7%.) An ACE inhibitor/angiotensin II receptor blocker is being taken.  Hyperlipidemia This is a chronic problem. The current episode started more than 1 year ago. The problem is uncontrolled. Exacerbating diseases include diabetes and obesity. Pertinent negatives include no chest pain, leg pain, myalgias or shortness of breath. Current antihyperlipidemic treatment includes statins. Compliance problems: She had history of noncompliance.  Risk factors for coronary artery disease include dyslipidemia, diabetes mellitus, hypertension, obesity and a sedentary lifestyle.  Hypertension This is a chronic problem. The current episode started more than 1 year ago. The problem  is uncontrolled. Pertinent negatives include no chest pain, headaches, palpitations or shortness of breath. Risk factors for coronary artery disease include diabetes mellitus, dyslipidemia, obesity and sedentary lifestyle. Past treatments include angiotensin blockers. Hypertensive end-organ damage includes CVA.    Review of Systems  Constitutional: Negative for unexpected weight change.  HENT: Negative for trouble swallowing and voice change.   Eyes: Negative for visual disturbance.  Respiratory: Negative for cough, shortness of breath and wheezing.   Cardiovascular: Negative for chest pain, palpitations and leg swelling.  Gastrointestinal: Negative for diarrhea, nausea and vomiting.  Endocrine: Negative for cold intolerance, heat intolerance, polydipsia, polyphagia and polyuria.  Musculoskeletal: Negative for arthralgias and myalgias.  Skin: Negative for color change, pallor, rash and wound.  Neurological: Negative for seizures and headaches.  Psychiatric/Behavioral: Negative for confusion and suicidal ideas.    Objective:  There were no vitals taken for this visit.  Wt Readings from Last 3 Encounters:  08/18/18 198 lb (89.8 kg)  05/30/18 193 lb (87.5 kg)  05/10/18 191 lb 3.2 oz (86.7 kg)      Results for orders placed or performed in visit on 04/26/19  COMPLETE METABOLIC PANEL WITH GFR  Result Value Ref Range   Glucose, Bld 270 (H) 65 - 139 mg/dL   BUN 13 7 - 25 mg/dL   Creat 0.81 0.50 - 0.99 mg/dL   GFR, Est Non African American 77 > OR = 60 mL/min/1.40m2   GFR, Est African American 89 > OR = 60 mL/min/1.15m2   BUN/Creatinine Ratio NOT APPLICABLE 6 - 22 (calc)   Sodium 136 135 - 146 mmol/L   Potassium 4.2 3.5 - 5.3 mmol/L   Chloride 101 98 - 110 mmol/L   CO2 28 20 - 32 mmol/L   Calcium 9.3 8.6 - 10.4 mg/dL   Total Protein 7.2 6.1 - 8.1 g/dL   Albumin 4.2 3.6 - 5.1 g/dL   Globulin 3.0 1.9 - 3.7 g/dL (calc)   AG Ratio 1.4 1.0 - 2.5 (calc)   Total Bilirubin 0.5 0.2 -  1.2 mg/dL   Alkaline phosphatase (APISO) 50 37 - 153 U/L   AST 14 10 - 35 U/L   ALT 16 6 - 29 U/L  Hemoglobin A1c  Result Value Ref Range   Hgb A1c MFr Bld 9.0 (H) <5.7 % of total Hgb   Mean Plasma Glucose 212 (calc)   eAG (mmol/L) 11.7 (calc)   Diabetic Labs (most recent): Lab Results  Component Value Date   HGBA1C 9.0 (H) 04/26/2019   HGBA1C 9.5 (H) 08/08/2018   HGBA1C 10.7 (H) 05/04/2018   Lipid Panel     Component Value Date/Time   CHOL 213 (H) 09/30/2017 0915   TRIG 99 09/30/2017 0915   HDL 44 (L) 09/30/2017 0915   CHOLHDL 4.8 09/30/2017 0915   VLDL 25 07/23/2016 0856   LDLCALC 148 (H) 09/30/2017 0915   LDLDIRECT 135.5 05/02/2012 0945      Assessment & Plan:   1. Uncontrolled type 2 diabetes mellitus with other circulatory complication, with long-term current use of insulin ([place]) - her diabetes is  complicated by carotid artery stenosis and mild renal insufficiency, and history of noncompliance / non-aherence, patient remains at extremely high risk for more acute and chronic complications of diabetes which include CAD, CVA, CKD, retinopathy, and neuropathy. These are all discussed in detail with the patient.  -She missed her appointment since November 2019, reports glycemic profile averaging 140-150 the last 30 days and A1c of 9%, progressively improving from 10.7%.    - Her recent labs reviewed.  - I have re-counseled the patient on diet management and weight loss  by adopting a carbohydrate restricted / protein rich  Diet.  - she  admits there is a room for improvement in her diet and drink choices. -  Suggestion is made for her to avoid simple carbohydrates  from her diet including Cakes, Sweet Desserts / Pastries, Ice Cream, Soda (diet and regular), Sweet Tea, Candies, Chips, Cookies, Sweet Pastries,  Store Bought Juices, Alcohol in Excess of  1-2 drinks a day, Artificial Sweeteners, Coffee Creamer, and "Sugar-free" Products. This will help patient to have stable  blood glucose profile and potentially avoid unintended weight gain.   - Patient is advised to stick to a routine mealtimes to eat 3 meals  a day and avoid unnecessary snacks (  to snack only to correct hypoglycemia).  - I have approached patient with the following individualized plan to manage diabetes and patient agrees.  -She has chronically uncontrolled type 2 diabetes worsened by her chronic  noncompliance /nonadherence and patient still easily disengages from self-care. -She has trouble affording co-pays for insulin analogs, however, she did reasonably well on Novolin 70/30 , and wishes to stay on this regimen for cost reasons. -She was offered addition of low-dose glipizide, declines this intervention due to concern of hypoglycemia. -She reports glycemic profile near target, in discrepancy with her A1c of 9%, she is advised to continue  Novolin  70/30   35 units with breakfast and 35 units with supper for  pre-meal blood glucose above 90 mg/dL. -She is advised to start monitoring blood glucose 4 times a day-before meals and at bedtime .   -If her glycemic profile is not satisfactory, she will be considered for insulin analogs.   - She would have benefited from continuous glucose monitoring, however her insurance did not provide coverage for the prescription.   -Adjustment parameters for hypo and hyperglycemia were given in a written document to patient. -Patient is encouraged to call clinic for blood glucose levels less than 70 or above 300 mg /dl. - She does not tolerate metformin.   - Patient specific target  for A1c; LDL, HDL, Triglycerides, and  Waist Circumference were discussed in detail.  2) BP/HTN: Her blood pressure is not controlled to target.   She is advised to continue her current blood pressure medications including losartan-hydrochlorothiazide, amlodipine.    3) Lipids/HPL: She has severe dyslipidemia, LDLnot controlled to target.  She has not been taking her statins due  to diffuse arthralgias as a side effect of atorvastatin.  I discussed and prescribed Repatha 140 mg subcutaneously every 2 weeks, if her insurance provides coverage.     4)  Weight/Diet:  CDE consult has been requested, exercise, and carbohydrates information provided.  5) Chronic Care/Health Maintenance:  -Patient  is  on ACEI/ARB and Statin medications and encouraged to continue to follow up with Ophthalmology, Podiatrist at least yearly or according to recommendations, and advised to  stay away from smoking. I have recommended yearly flu vaccine and pneumonia vaccination at least every 5 years; moderate intensity exercise for up to 150 minutes weekly; and  sleep for at least 7 hours a day.  - I advised patient to maintain close follow up with Sinda Du, MD for primary care needs.   - Patient Care Time Today:  25 min, of which >50% was spent in reviewing her  current and  previous labs/studies, previous treatments, and medications doses and developing a plan for long-term care based on the latest recommendations for standards of care.  Stacy [name] participated in the discussions, expressed understanding, and voiced agreement with the above plans.  All questions were answered to her satisfaction. she is encouraged to contact clinic should she have any questions or concerns prior to her return visit.   Follow up plan: -Return in about 3 months (around 08/08/2019), or logs-6, for Follow up with Pre-visit Labs, Meter, and Logs.  Glade Lloyd, MD Phone: [phone]  Fax: [phone]  This note was partially dictated with voice recognition software. Similar sounding words can be transcribed inadequately or may not  be corrected upon review.  05/08/2019, 10:08 AM

## 2019-05-19 DIAGNOSIS — H53132 Sudden visual loss, left eye: Secondary | ICD-10-CM | POA: Diagnosis not present

## 2019-05-19 DIAGNOSIS — Z8673 Personal history of transient ischemic attack (TIA), and cerebral infarction without residual deficits: Secondary | ICD-10-CM | POA: Diagnosis not present

## 2019-05-19 DIAGNOSIS — I10 Essential (primary) hypertension: Secondary | ICD-10-CM | POA: Diagnosis not present

## 2019-05-19 DIAGNOSIS — H4312 Vitreous hemorrhage, left eye: Secondary | ICD-10-CM | POA: Diagnosis not present

## 2019-05-19 DIAGNOSIS — H2513 Age-related nuclear cataract, bilateral: Secondary | ICD-10-CM | POA: Diagnosis not present

## 2019-05-19 DIAGNOSIS — E785 Hyperlipidemia, unspecified: Secondary | ICD-10-CM | POA: Diagnosis not present

## 2019-05-19 DIAGNOSIS — Z794 Long term (current) use of insulin: Secondary | ICD-10-CM | POA: Diagnosis not present

## 2019-05-19 DIAGNOSIS — E113593 Type 2 diabetes mellitus with proliferative diabetic retinopathy without macular edema, bilateral: Secondary | ICD-10-CM | POA: Diagnosis not present

## 2019-05-24 DIAGNOSIS — E11319 Type 2 diabetes mellitus with unspecified diabetic retinopathy without macular edema: Secondary | ICD-10-CM | POA: Diagnosis not present

## 2019-05-24 DIAGNOSIS — E669 Obesity, unspecified: Secondary | ICD-10-CM | POA: Diagnosis not present

## 2019-05-24 DIAGNOSIS — E785 Hyperlipidemia, unspecified: Secondary | ICD-10-CM | POA: Diagnosis not present

## 2019-05-24 DIAGNOSIS — I10 Essential (primary) hypertension: Secondary | ICD-10-CM | POA: Diagnosis not present

## 2019-05-29 DIAGNOSIS — E113593 Type 2 diabetes mellitus with proliferative diabetic retinopathy without macular edema, bilateral: Secondary | ICD-10-CM | POA: Diagnosis not present

## 2019-05-29 DIAGNOSIS — H35033 Hypertensive retinopathy, bilateral: Secondary | ICD-10-CM | POA: Diagnosis not present

## 2019-05-29 DIAGNOSIS — H3582 Retinal ischemia: Secondary | ICD-10-CM | POA: Diagnosis not present

## 2019-05-29 DIAGNOSIS — H4312 Vitreous hemorrhage, left eye: Secondary | ICD-10-CM | POA: Diagnosis not present

## 2019-06-15 DIAGNOSIS — E113592 Type 2 diabetes mellitus with proliferative diabetic retinopathy without macular edema, left eye: Secondary | ICD-10-CM | POA: Diagnosis not present

## 2019-07-07 ENCOUNTER — Ambulatory Visit (INDEPENDENT_AMBULATORY_CARE_PROVIDER_SITE_OTHER): Payer: Medicare Other | Admitting: Cardiology

## 2019-07-07 ENCOUNTER — Encounter: Payer: Self-pay | Admitting: Cardiology

## 2019-07-07 ENCOUNTER — Other Ambulatory Visit: Payer: Self-pay

## 2019-07-07 VITALS — BP 153/71 | HR 71 | Temp 96.6°F | Ht 61.5 in | Wt 198.0 lb

## 2019-07-07 DIAGNOSIS — R0602 Shortness of breath: Secondary | ICD-10-CM | POA: Diagnosis not present

## 2019-07-07 DIAGNOSIS — R9431 Abnormal electrocardiogram [ECG] [EKG]: Secondary | ICD-10-CM | POA: Diagnosis not present

## 2019-07-07 NOTE — Progress Notes (Signed)
Clinical Summary Stacy [name] is a 63 y.o.female seen as new consult, referred by Dr Stacy [name] for SOB   1. SOB/DOE - ongoing for a few months - notes was sedentary during pandemic - starting to get active Stacy [name] with walking about 3 blocks.  - no recent chest pain - occasiaional LE edema of and on for year   2016 echo: LVEF 65-70%, no WMAs, grade II diastolic function   CAD risk factors: DM2, HTN, HL, father with CAD in early 39s  2. HTN - compliant with meds, unsure if took last night.  - 120/80 at pcp visit in 05/2019 Past Medical History:  Diagnosis Date  . Chronic back pain   . Diabetes mellitus   . HTN (hypertension)   . Hyperlipidemia   . Osteoarthritis   . Stroke [facility])      No Known Allergies   Current Outpatient Medications  Medication Sig Dispense Refill  . amLODipine (NORVASC) 5 MG tablet Take 5 mg by mouth daily.    . B-D UF III MINI PEN NEEDLES 31G X 5 MM MISC AS DIRECTED DX:250.0 (Patient not taking: Reported on 05/10/2018) 100 each 10  . Evolocumab (REPATHA SURECLICK) [id number] MG/ML SOAJ Inject 140 mg into the skin every 14 (fourteen) days. 2 pen 3  . glucose blood (FREESTYLE TEST STRIPS) test strip USE TO TEST BLOOD SUGAR FOUR TIMES DAILY AS DIRECTED. 100 each 2  . insulin NPH-regular Human (NOVOLIN 70/30) (70-30) 100 UNIT/ML injection INJECT 35 UNITS INTO THE SKIN WITH BREAKFAST AND 35 UNITS WITH SUPPER WHEN BLOOD GLUCOSE READINGS ARE ABOVE 90 MG/DL 20 mL 2  . INSULIN SYRINGE .5CC/29G 29G X 1/2" 0.5 ML MISC USE TO INJECT INSULIN TWICE DAILY 100 each 3  . losartan-hydrochlorothiazide (HYZAAR) 50-12.5 MG tablet Take 1 tablet by mouth daily. 30 tablet 2   No current facility-administered medications for this visit.      Past Surgical History:  Procedure Laterality Date  . carpel tunnel release     bilateral  . CESAREAN SECTION    . COLONOSCOPY  2006   normal, Dr. Benson Norway  . ESOPHAGOGASTRODUODENOSCOPY  2006   normal, Dr. Benson Norway     No Known  Allergies    Family History  Problem Relation Age of Onset  . Diabetes Mother   . Heart disease Mother   . Hyperlipidemia Mother   . Hypertension Mother   . Heart attack Mother   . Diabetes Father   . Heart disease Father   . Hyperlipidemia Father   . Hypertension Father   . Heart attack Father   . Heart disease Brother   . Liver disease Neg Hx   . Colon cancer Neg Hx   . GI problems Neg Hx   . Stroke Neg Hx      Social History [name] reports that she has never smoked. She has never used smokeless tobacco. [name] reports no history of alcohol use.   Review of Systems CONSTITUTIONAL: No weight loss, fever, chills, weakness or fatigue.  HEENT: Eyes: No visual loss, blurred vision, double vision or yellow sclerae.No hearing loss, sneezing, congestion, runny nose or sore throat.  SKIN: No rash or itching.  CARDIOVASCULAR:per hpi  RESPIRATORY: per hpi GASTROINTESTINAL: No anorexia, nausea, vomiting or diarrhea. No abdominal pain or blood.  GENITOURINARY: No burning on urination, no polyuria NEUROLOGICAL: No headache, dizziness, syncope, paralysis, ataxia, numbness or tingling in the extremities. No change in bowel or bladder control.  MUSCULOSKELETAL:  No muscle, back pain, joint pain or stiffness.  LYMPHATICS: No enlarged nodes. No history of splenectomy.  PSYCHIATRIC: No history of depression or anxiety.  ENDOCRINOLOGIC: No reports of sweating, cold or heat intolerance. No polyuria or polydipsia.  Marland Kitchen   Physical Examination Today's Vitals   07/07/19 0917  BP: (!) 153/71  Pulse: 71  Temp: (!) 96.6 F (35.9 C)  Weight: 198 lb (89.8 kg)  Height: 5' 1.5" (1.562 m)   Body mass index is 36.81 kg/m.  Gen: resting comfortably, no acute distress HEENT: no scleral icterus, pupils equal round and reactive, no palptable cervical adenopathy,  CV: RRR, no m/r/g, no jvd Resp: Clear to auscultation bilaterally GI: abdomen is soft, non-tender, non-distended, normal  bowel sounds, no hepatosplenomegaly MSK: extremities are warm, no edema.  Skin: warm, no rash Neuro:  no focal deficits Psych: appropriate affect   Diagnostic Studies     Assessment and Plan  1. SOB/DOE - unclear etiology. Several CAD/cardiac risk factors - EKG today shows SR, inferior TWIs with no prior to compare - plan for echo, pending results would likely pursue lexiscan given baselien abnormal EKG with CAD risk factors and DOE.        Arnoldo Lenis, M.D.

## 2019-07-07 NOTE — Patient Instructions (Signed)
Medication Instructions:  Your physician recommends that you continue on your current medications as directed. Please refer to the Current Medication list given to you today.   Labwork: NONE  Testing/Procedures: Your physician has requested that you have an echocardiogram. Echocardiography is a painless test that uses sound waves to create images of your heart. It provides your doctor with information about the size and shape of your heart and how well your heart's chambers and valves are working. This procedure takes approximately one hour. There are no restrictions for this procedure.    Follow-Up: Your physician recommends that you schedule a follow-up appointment in: PENDING TEST    Any Other Special Instructions Will Be Listed Below (If Applicable).     If you need a refill on your cardiac medications before your next appointment, please call your pharmacy.

## 2019-07-12 DIAGNOSIS — Z23 Encounter for immunization: Secondary | ICD-10-CM | POA: Diagnosis not present

## 2019-07-16 ENCOUNTER — Other Ambulatory Visit: Payer: Self-pay | Admitting: "Endocrinology

## 2019-07-16 DIAGNOSIS — E1159 Type 2 diabetes mellitus with other circulatory complications: Secondary | ICD-10-CM

## 2019-07-19 DIAGNOSIS — E113591 Type 2 diabetes mellitus with proliferative diabetic retinopathy without macular edema, right eye: Secondary | ICD-10-CM | POA: Diagnosis not present

## 2019-07-20 ENCOUNTER — Other Ambulatory Visit (HOSPITAL_COMMUNITY): Payer: Medicare Other

## 2019-07-24 ENCOUNTER — Ambulatory Visit (HOSPITAL_COMMUNITY)
Admission: RE | Admit: 2019-07-24 | Discharge: 2019-07-24 | Disposition: A | Discharge status: Home / Self Care | Payer: Medicare Other | Source: Ambulatory Visit | Attending: Cardiology | Admitting: Cardiology

## 2019-07-24 ENCOUNTER — Other Ambulatory Visit: Payer: Self-pay

## 2019-07-24 DIAGNOSIS — R0602 Shortness of breath: Secondary | ICD-10-CM

## 2019-07-24 NOTE — Progress Notes (Signed)
*  PRELIMINARY RESULTS* Echocardiogram 2D Echocardiogram has been performed.  Samuel Germany 07/24/2019, 3:59 PM

## 2019-07-26 ENCOUNTER — Telehealth: Payer: Self-pay | Admitting: *Deleted

## 2019-07-26 NOTE — Telephone Encounter (Signed)
-----   Message from Herminio Commons, MD sent at 07/24/2019  4:48 PM EDT ----- Normal pumping function with some increase in wall thickness.

## 2019-07-26 NOTE — Telephone Encounter (Signed)
Called patient with test results. No answer. Left message to call back.  

## 2019-07-31 DIAGNOSIS — E669 Obesity, unspecified: Secondary | ICD-10-CM | POA: Diagnosis not present

## 2019-07-31 DIAGNOSIS — I10 Essential (primary) hypertension: Secondary | ICD-10-CM | POA: Diagnosis not present

## 2019-07-31 DIAGNOSIS — E11319 Type 2 diabetes mellitus with unspecified diabetic retinopathy without macular edema: Secondary | ICD-10-CM | POA: Diagnosis not present

## 2019-07-31 DIAGNOSIS — E1159 Type 2 diabetes mellitus with other circulatory complications: Secondary | ICD-10-CM | POA: Diagnosis not present

## 2019-07-31 DIAGNOSIS — E785 Hyperlipidemia, unspecified: Secondary | ICD-10-CM | POA: Diagnosis not present

## 2019-07-31 LAB — LIPID PANEL
Cholesterol: 125 (ref 0–200)
HDL: 40 (ref 35–70)
LDL Cholesterol: 67
Triglycerides: 92 (ref 40–160)

## 2019-08-01 LAB — COMPLETE METABOLIC PANEL WITH GFR
AG Ratio: 1.3 (calc) (ref 1.0–2.5)
ALT: 15 U/L (ref 6–29)
AST: 14 U/L (ref 10–35)
Albumin: 4 g/dL (ref 3.6–5.1)
Alkaline phosphatase (APISO): 46 U/L (ref 37–153)
BUN: 11 mg/dL (ref 7–25)
CO2: 29 mmol/L (ref 20–32)
Calcium: 9.2 mg/dL (ref 8.6–10.4)
Chloride: 107 mmol/L (ref 98–110)
Creat: 0.81 mg/dL (ref 0.50–0.99)
GFR, Est African American: 89 mL/min/{1.73_m2} (ref >=60)
GFR, Est Non African American: 77 mL/min/{1.73_m2} (ref >=60)
Globulin: 3 g/dL (calc) (ref 1.9–3.7)
Glucose, Bld: 191 mg/dL — ABNORMAL HIGH (ref 65–99)
Potassium: 4.1 mmol/L (ref 3.5–5.3)
Sodium: 141 mmol/L (ref 135–146)
Total Bilirubin: 0.5 mg/dL (ref 0.2–1.2)
Total Protein: 7 g/dL (ref 6.1–8.1)

## 2019-08-01 LAB — HEMOGLOBIN A1C
Hgb A1c MFr Bld: 8.7 % of total Hgb — ABNORMAL HIGH (ref <5.7)
Mean Plasma Glucose: 203 (calc)
eAG (mmol/L): 11.2 (calc)

## 2019-08-10 ENCOUNTER — Ambulatory Visit: Payer: Medicare Other | Admitting: "Endocrinology

## 2019-08-10 ENCOUNTER — Other Ambulatory Visit: Payer: Self-pay

## 2019-08-11 ENCOUNTER — Ambulatory Visit (INDEPENDENT_AMBULATORY_CARE_PROVIDER_SITE_OTHER): Payer: Medicare Other | Admitting: "Endocrinology

## 2019-08-11 ENCOUNTER — Other Ambulatory Visit: Payer: Self-pay

## 2019-08-11 ENCOUNTER — Encounter: Payer: Self-pay | Admitting: "Endocrinology

## 2019-08-11 DIAGNOSIS — E782 Mixed hyperlipidemia: Secondary | ICD-10-CM

## 2019-08-11 DIAGNOSIS — E1159 Type 2 diabetes mellitus with other circulatory complications: Secondary | ICD-10-CM

## 2019-08-11 DIAGNOSIS — I10 Essential (primary) hypertension: Secondary | ICD-10-CM | POA: Diagnosis not present

## 2019-08-11 NOTE — Progress Notes (Signed)
08/11/2019                                                    Endocrinology Telehealth Visit Follow up Note -During COVID -19 Pandemic  This visit type was conducted due to national recommendations for restrictions regarding the COVID-19 Pandemic  in an effort to limit this patient's exposure and mitigate transmission of the corona virus.  Due to her co-morbid illnesses, Stacy [name] is at  moderate to high risk for complications without adequate follow up.  This format is felt to be most appropriate for her at this time.  I connected with this patient on 08/11/2019   by telephone and verified that I am speaking with the correct person using two identifiers. Stacy [name], Feb 28, 1956. she has verbally consented to this visit. All issues noted in this document were discussed and addressed. The format was not optimal for physical exam.    Subjective:    Patient ID: Stacy [name], female    DOB: 12-10-1955, PCP Sinda Du, MD   Past Medical History:  Diagnosis Date  . Chronic back pain   . Diabetes mellitus   . HTN (hypertension)   . Hyperlipidemia   . Osteoarthritis   . Stroke [facility])    Past Surgical History:  Procedure Laterality Date  . carpel tunnel release     bilateral  . CESAREAN SECTION    . COLONOSCOPY  2006   normal, Dr. Benson Norway  . ESOPHAGOGASTRODUODENOSCOPY  2006   normal, Dr. Benson Norway   Social History   Socioeconomic History  . Marital status: Divorced    Spouse name: Not on file  . Number of children: Not on file  . Years of education: Not on file  . Highest education level: Not on file  Occupational History    Employer: [employer]    Comment: out due to back injury  Social Needs  . Financial resource strain: Not on file  . Food insecurity    Worry: Not on file    Inability: Not on file  . Transportation needs    Medical: Not on file    Non-medical: Not on file  Tobacco Use  . Smoking status: Never Smoker  . Smokeless tobacco: Never Used  Substance  and Sexual Activity  . Alcohol use: No    Alcohol/week: 0.0 standard drinks  . Drug use: No  . Sexual activity: Never  Lifestyle  . Physical activity    Days per week: Not on file    Minutes per session: Not on file  . Stress: Not on file  Relationships  . Social Herbalist on phone: Not on file    Gets together: Not on file    Attends religious service: Not on file    Active member of club or organization: Not on file    Attends meetings of clubs or organizations: Not on file    Relationship status: Not on file  Other Topics Concern  . Not on file  Social History Narrative  . Not on file   Outpatient Encounter Medications as of 08/11/2019  Medication Sig  . B-D UF III MINI PEN NEEDLES 31G X 5 MM MISC AS DIRECTED DX:250.0  . glucose blood (FREESTYLE TEST STRIPS) test strip USE TO TEST BLOOD SUGAR FOUR TIMES DAILY AS DIRECTED.  Marland Kitchen  insulin NPH-regular Human (NOVOLIN 70/30) (70-30) 100 UNIT/ML injection INJECT 35 UNITS UNDER THE SKIN WITH BREAKFAST AND INJECT 35 UNITS UNDER THE SKIN WITH SUPPER WHEN GLUCOSE READINGS ARE ABOVE 90  . INSULIN SYRINGE .5CC/29G 29G X 1/2" 0.5 ML MISC USE TO INJECT INSULIN TWICE DAILY  . losartan-hydrochlorothiazide (HYZAAR) 50-12.5 MG tablet Take 1 tablet by mouth daily.  . rosuvastatin (CRESTOR) 20 MG tablet Take 20 mg by mouth daily.   No facility-administered encounter medications on file as of 08/11/2019.    ALLERGIES: No Known Allergies VACCINATION STATUS: Immunization History  Administered Date(s) Administered  . Influenza Whole 07/13/2007, 08/07/2009  . Pneumococcal Polysaccharide-23 04/22/2012  . Td 01/11/2007    Diabetes She presents for her follow-up diabetic visit. She has type 2 diabetes mellitus. Onset time: She was diagnosed at approximate age of 55 years. Her disease course has been improving. There are no hypoglycemic associated symptoms. Pertinent negatives for hypoglycemia include no confusion, headaches, pallor or  seizures. Pertinent negatives for diabetes include no chest pain, no polydipsia, no polyphagia and no polyuria. There are no hypoglycemic complications. Symptoms are improving. Diabetic complications include a CVA and nephropathy. Risk factors for coronary artery disease include diabetes mellitus, dyslipidemia, hypertension, obesity and sedentary lifestyle. Current diabetic treatment includes insulin injections. Compliance with diabetes treatment: She has history of noncompliance . Her weight is increasing steadily. She is following a generally unhealthy diet. When asked about meal planning, she reported none. She has not had a previous visit with a dietitian. She never (She has chronic back problem.) participates in exercise. She monitors blood glucose at home 3-4 x per day. Blood glucose monitoring compliance is inadequate. Her breakfast blood glucose range is generally 140-180 mg/dl. Her lunch blood glucose range is generally 140-180 mg/dl. Her dinner blood glucose range is generally 140-180 mg/dl. Her bedtime blood glucose range is generally 140-180 mg/dl. Her overall blood glucose range is 140-180 mg/dl. (She reports that her fasting blood glucose ranges between 76-184, lunchtime between 83-186, suppertime between 74-164.  Average blood glucose for the last 30 days is 145, last 90 days 144.  Her A1c is 8.7% improving slowly.) An ACE inhibitor/angiotensin II receptor blocker is being taken.  Hyperlipidemia This is a chronic problem. The current episode started more than 1 year ago. The problem is uncontrolled. Exacerbating diseases include diabetes and obesity. Pertinent negatives include no chest pain, leg pain, myalgias or shortness of breath. Current antihyperlipidemic treatment includes statins. Compliance problems: She had history of noncompliance.  Risk factors for coronary artery disease include dyslipidemia, diabetes mellitus, hypertension, obesity and a sedentary lifestyle.  Hypertension This is a  chronic problem. The current episode started more than 1 year ago. The problem is uncontrolled. Pertinent negatives include no chest pain, headaches, palpitations or shortness of breath. Risk factors for coronary artery disease include diabetes mellitus, dyslipidemia, obesity and sedentary lifestyle. Past treatments include angiotensin blockers. Hypertensive end-organ damage includes CVA.    Review of systems: Limited as above.   Objective:    There were no vitals taken for this visit.  Wt Readings from Last 3 Encounters:  07/07/19 198 lb (89.8 kg)  08/18/18 198 lb (89.8 kg)  05/30/18 193 lb (87.5 kg)      Results for orders placed or performed in visit on 05/08/19  Hemoglobin A1c  Result Value Ref Range   Hgb A1c MFr Bld 8.7 (H) <5.7 % of total Hgb   Mean Plasma Glucose 203 (calc)   eAG (mmol/L) 11.2 (calc)  COMPLETE  METABOLIC PANEL WITH GFR  Result Value Ref Range   Glucose, Bld 191 (H) 65 - 99 mg/dL   BUN 11 7 - 25 mg/dL   Creat 0.81 0.50 - 0.99 mg/dL   GFR, Est Non African American 77 > OR = 60 mL/min/1.84m2   GFR, Est African American 89 > OR = 60 mL/min/1.92m2   BUN/Creatinine Ratio NOT APPLICABLE 6 - 22 (calc)   Sodium 141 135 - 146 mmol/L   Potassium 4.1 3.5 - 5.3 mmol/L   Chloride 107 98 - 110 mmol/L   CO2 29 20 - 32 mmol/L   Calcium 9.2 8.6 - 10.4 mg/dL   Total Protein 7.0 6.1 - 8.1 g/dL   Albumin 4.0 3.6 - 5.1 g/dL   Globulin 3.0 1.9 - 3.7 g/dL (calc)   AG Ratio 1.3 1.0 - 2.5 (calc)   Total Bilirubin 0.5 0.2 - 1.2 mg/dL   Alkaline phosphatase (APISO) 46 37 - 153 U/L   AST 14 10 - 35 U/L   ALT 15 6 - 29 U/L   Diabetic Labs (most recent): Lab Results  Component Value Date   HGBA1C 8.7 (H) 07/31/2019   HGBA1C 9.0 (H) 04/26/2019   HGBA1C 9.5 (H) 08/08/2018   Lipid Panel     Component Value Date/Time   CHOL 213 (H) 09/30/2017 0915   TRIG 99 09/30/2017 0915   HDL 44 (L) 09/30/2017 0915   CHOLHDL 4.8 09/30/2017 0915   VLDL 25 07/23/2016 0856   LDLCALC  148 (H) 09/30/2017 0915   LDLDIRECT 135.5 05/02/2012 0945      Assessment & Plan:   1. Uncontrolled type 2 diabetes mellitus with other circulatory complication, with long-term current use of insulin ([place]) - her diabetes is  complicated by carotid artery stenosis and mild renal insufficiency, and history of noncompliance / non-aherence, patient remains at extremely high risk for more acute and chronic complications of diabetes which include CAD, CVA, CKD, retinopathy, and neuropathy. These are all discussed in detail with the patient.  -She had reengaged much better, with A1c of 8.7% progressively improving from 10.7%  - Her recent labs reviewed.  - I have re-counseled the patient on diet management and weight loss  by adopting a carbohydrate restricted / protein rich  Diet.  - she  admits there is a room for improvement in her diet and drink choices. -  Suggestion is made for her to avoid simple carbohydrates  from her diet including Cakes, Sweet Desserts / Pastries, Ice Cream, Soda (diet and regular), Sweet Tea, Candies, Chips, Cookies, Sweet Pastries,  Store Bought Juices, Alcohol in Excess of  1-2 drinks a day, Artificial Sweeteners, Coffee Creamer, and "Sugar-free" Products. This will help patient to have stable blood glucose profile and potentially avoid unintended weight gain.  - Patient is advised to stick to a routine mealtimes to eat 3 meals  a day and avoid unnecessary snacks ( to snack only to correct hypoglycemia).  - I have approached patient with the following individualized plan to manage diabetes and patient agrees.  -She has chronically uncontrolled type 2 diabetes.  She has had a history of noncompliance /nonadherence and patient still easily disengages from self-care. -She has trouble affording co-pays for insulin analogs, however, she did reasonably well on Novolin 70/30 , and wishes to stay on this regimen for cost reasons.  -She reports glycemic profile near target,  90-day average is 144, in discrepancy with her A1c of 8.7%,  she is advised to continue  Novolin  70/30   35 units with breakfast and 35 units with supper for  pre-meal blood glucose above 90 mg/dL. -She is advised to start monitoring blood glucose 4 times a day-before meals and at bedtime . -She will be considered for low-dose glipizide next visit if she had postprandial hyperglycemia   - She would have benefited from continuous glucose monitoring, however her insurance did not provide coverage for the prescription.   -Adjustment parameters for hypo and hyperglycemia were given in a written document to patient. -Patient is encouraged to call clinic for blood glucose levels less than 70 or above 300 mg /dl. - She does not tolerate metformin.   - Patient specific target  for A1c; LDL, HDL, Triglycerides, and  Waist Circumference were discussed in detail.  2) BP/HTN: she is advised to home monitor blood pressure and report if > 140/90 on 2 separate readings.   She is advised to continue her current blood pressure medications including losartan-hydrochlorothiazide, amlodipine.    3) Lipids/HPL: She has severe dyslipidemia, LDLnot controlled to target.  She has not been taking her statins due to diffuse arthralgias as a side effect of atorvastatin.  I discussed and prescribed Repatha 140 mg subcutaneously every 2 weeks, if her insurance provides coverage.     4)  Weight/Diet:  CDE consult has been requested, exercise, and carbohydrates information provided.  5) Chronic Care/Health Maintenance:  -Patient  is  on ACEI/ARB and Statin medications and encouraged to continue to follow up with Ophthalmology, Podiatrist at least yearly or according to recommendations, and advised to  stay away from smoking. I have recommended yearly flu vaccine and pneumonia vaccination at least every 5 years; moderate intensity exercise for up to 150 minutes weekly; and  sleep for at least 7 hours a day.  - I advised  patient to maintain close follow up with Sinda Du, MD for primary care needs.   - Patient Care Time Today:  25 min, of which >50% was spent in  counseling and the rest reviewing her  current and  previous labs/studies, previous treatments, her blood glucose readings, and medications' doses and developing a plan for long-term care based on the latest recommendations for standards of care.   Stacy [name] participated in the discussions, expressed understanding, and voiced agreement with the above plans.  All questions were answered to her satisfaction. she is encouraged to contact clinic should she have any questions or concerns prior to her return visit.    Follow up plan: -Return in about 4 months (around 12/10/2019) for Bring Meter and Logs- A1c in Office, Include 8 log sheets.  Glade Lloyd, MD Phone: [phone]  Fax: [phone]  This note was partially dictated with voice recognition software. Similar sounding words can be transcribed inadequately or may not  be corrected upon review.  08/11/2019, 12:07 PM

## 2019-08-24 DIAGNOSIS — I10 Essential (primary) hypertension: Secondary | ICD-10-CM | POA: Diagnosis not present

## 2019-08-24 DIAGNOSIS — E11319 Type 2 diabetes mellitus with unspecified diabetic retinopathy without macular edema: Secondary | ICD-10-CM | POA: Diagnosis not present

## 2019-08-24 DIAGNOSIS — E785 Hyperlipidemia, unspecified: Secondary | ICD-10-CM | POA: Diagnosis not present

## 2019-08-24 DIAGNOSIS — Z23 Encounter for immunization: Secondary | ICD-10-CM | POA: Diagnosis not present

## 2019-08-24 DIAGNOSIS — E669 Obesity, unspecified: Secondary | ICD-10-CM | POA: Diagnosis not present

## 2019-09-21 DIAGNOSIS — H4312 Vitreous hemorrhage, left eye: Secondary | ICD-10-CM | POA: Diagnosis not present

## 2019-09-21 DIAGNOSIS — H35033 Hypertensive retinopathy, bilateral: Secondary | ICD-10-CM | POA: Diagnosis not present

## 2019-09-21 DIAGNOSIS — E113511 Type 2 diabetes mellitus with proliferative diabetic retinopathy with macular edema, right eye: Secondary | ICD-10-CM | POA: Diagnosis not present

## 2019-09-21 DIAGNOSIS — E113592 Type 2 diabetes mellitus with proliferative diabetic retinopathy without macular edema, left eye: Secondary | ICD-10-CM | POA: Diagnosis not present

## 2019-10-07 DIAGNOSIS — M5431 Sciatica, right side: Secondary | ICD-10-CM | POA: Insufficient documentation

## 2019-10-07 DIAGNOSIS — H53132 Sudden visual loss, left eye: Secondary | ICD-10-CM

## 2019-10-07 DIAGNOSIS — I10 Essential (primary) hypertension: Secondary | ICD-10-CM

## 2019-10-07 DIAGNOSIS — E11319 Type 2 diabetes mellitus with unspecified diabetic retinopathy without macular edema: Secondary | ICD-10-CM

## 2019-10-07 DIAGNOSIS — I639 Cerebral infarction, unspecified: Secondary | ICD-10-CM

## 2019-10-07 DIAGNOSIS — E669 Obesity, unspecified: Secondary | ICD-10-CM | POA: Insufficient documentation

## 2019-10-07 DIAGNOSIS — E7849 Other hyperlipidemia: Secondary | ICD-10-CM

## 2019-10-18 ENCOUNTER — Other Ambulatory Visit: Payer: Self-pay | Admitting: "Endocrinology

## 2019-10-18 DIAGNOSIS — E1159 Type 2 diabetes mellitus with other circulatory complications: Secondary | ICD-10-CM

## 2019-12-11 ENCOUNTER — Encounter: Payer: Self-pay | Admitting: "Endocrinology

## 2019-12-11 ENCOUNTER — Ambulatory Visit (INDEPENDENT_AMBULATORY_CARE_PROVIDER_SITE_OTHER): Payer: Medicare Other | Admitting: "Endocrinology

## 2019-12-11 ENCOUNTER — Other Ambulatory Visit: Payer: Self-pay

## 2019-12-11 VITALS — BP 177/95 | HR 69 | Ht 61.5 in | Wt 194.0 lb

## 2019-12-11 DIAGNOSIS — E1159 Type 2 diabetes mellitus with other circulatory complications: Secondary | ICD-10-CM | POA: Diagnosis not present

## 2019-12-11 DIAGNOSIS — E782 Mixed hyperlipidemia: Secondary | ICD-10-CM

## 2019-12-11 DIAGNOSIS — I10 Essential (primary) hypertension: Secondary | ICD-10-CM

## 2019-12-11 LAB — POCT GLYCOSYLATED HEMOGLOBIN (HGB A1C): Hemoglobin A1C: 8 % — AB (ref 4.0–5.6)

## 2019-12-11 MED ORDER — GLIPIZIDE ER 2.5 MG PO TB24: 2.5000 mg | ORAL_TABLET | Freq: Every day | ORAL | 3 refills | Status: DC

## 2019-12-11 NOTE — Patient Instructions (Signed)

## 2019-12-11 NOTE — Progress Notes (Signed)
12/11/2019                 Endocrinology follow-up note   Subjective:    Patient ID: Stacy [name], female    DOB: Mar 09, 1956, PCP Sinda Du, MD   Past Medical History:  Diagnosis Date  . Cerebral infarction, unspecified ([place])   . Chronic back pain   . Diabetes mellitus   . Essential (primary) hypertension   . HTN (hypertension)   . Hyperlipidemia   . Lumbago   . Obesity, unspecified   . Osteoarthritis   . Other hyperlipidemia   . Sciatica, right side   . Stroke ([place])   . Sudden visual loss, left eye   . Type 2 diabetes mellitus with unspecified diabetic retinopathy without macular edema (HCC)   . Type 2 diabetes mellitus without complications [facility])    Past Surgical History:  Procedure Laterality Date  . carpel tunnel release     bilateral  . CESAREAN SECTION    . COLONOSCOPY  2006   normal, Dr. Benson [place]  . ESOPHAGOGASTRODUODENOSCOPY  2006   normal, Dr. Benson [place]   Social History   Socioeconomic History  . Marital status: Divorced    Spouse name: Not on file  . Number of children: Not on file  . Years of education: Not on file  . Highest education level: Not on file  Occupational History    Employer: [employer]    Comment: out due to back injury  Tobacco Use  . Smoking status: Never Smoker  . Smokeless tobacco: Never Used  Substance and Sexual Activity  . Alcohol use: No    Alcohol/week: 0.0 standard drinks  . Drug use: No  . Sexual activity: Never  Other Topics Concern  . Not on file  Social History Narrative  . Not on file   Social Determinants of Health   Financial Resource Strain:   . Difficulty of Paying Living Expenses: Not on file  Food Insecurity:   . Worried About Charity fundraiser in the Last Year: Not on file  . Ran Out of Food in the Last Year: Not on file  Transportation Needs:   . Lack of Transportation (Medical): Not on file  . Lack of Transportation (Non-Medical): Not on file  Physical Activity:   . Days of Exercise per  Week: Not on file  . Minutes of Exercise per Session: Not on file  Stress:   . Feeling of Stress : Not on file  Social Connections:   . Frequency of Communication with Friends and Family: Not on file  . Frequency of Social Gatherings with Friends and Family: Not on file  . Attends Religious Services: Not on file  . Active Member of Clubs or Organizations: Not on file  . Attends Archivist Meetings: Not on file  . Marital Status: Not on file   Outpatient Encounter Medications as of 12/11/2019  Medication Sig  . rosuvastatin (CRESTOR) 10 MG tablet Take 10 mg by mouth daily.  Marland Kitchen amLODipine (NORVASC) 5 MG tablet Take 5 mg by mouth daily.  . B-D UF III MINI PEN NEEDLES 31G X 5 MM MISC AS DIRECTED DX:250.0  . clopidogrel (PLAVIX) 75 MG tablet Take 75 mg by mouth daily.  Marland Kitchen glucose blood (FREESTYLE TEST STRIPS) test strip USE TO TEST BLOOD SUGAR FOUR TIMES DAILY AS DIRECTED.  Marland Kitchen INSULIN SYRINGE .5CC/29G 29G X 1/2" 0.5 ML MISC USE TO INJECT INSULIN TWICE DAILY  . losartan-hydrochlorothiazide (HYZAAR) 50-12.5 MG tablet  Take 1 tablet by mouth daily.  Marland Kitchen NOVOLIN 70/30 (70-30) 100 UNIT/ML injection INJECT 35 UNITS UNDER THE SKIN WITH BREAKFAST AND INJECT 35 UNITS UNDER THE SKIN WITH SUPPER WHEN GLUCOSE READINGS ARE ABOVE 90  . [DISCONTINUED] aspirin EC 81 MG tablet Take 81 mg by mouth daily.  . [DISCONTINUED] canagliflozin (INVOKANA) 100 MG TABS tablet Take 100 mg by mouth daily before breakfast.  . [DISCONTINUED] insulin detemir (LEVEMIR) 100 unit/ml SOLN Inject 30 Units into the skin at bedtime.  . [DISCONTINUED] omeprazole (PRILOSEC) 20 MG capsule Take 20 mg by mouth daily.  . [DISCONTINUED] rosuvastatin (CRESTOR) 20 MG tablet Take 20 mg by mouth daily.   No facility-administered encounter medications on file as of 12/11/2019.   ALLERGIES: No Known Allergies VACCINATION STATUS: Immunization History  Administered Date(s) Administered  . Influenza Whole 07/13/2007, 08/07/2009  .  Influenza-Unspecified 07/19/2012, 07/28/2017, 08/22/2018, 10/12/2018, 07/12/2019  . Pneumococcal Conjugate-13 08/22/2018  . Pneumococcal Polysaccharide-23 04/22/2012, 08/24/2019  . Td 01/11/2007    Diabetes She presents for her follow-up diabetic visit. She has type 2 diabetes mellitus. Onset time: She was diagnosed at approximate age of 73 years. Her disease course has been improving. There are no hypoglycemic associated symptoms. Pertinent negatives for hypoglycemia include no confusion, headaches, pallor or seizures. Pertinent negatives for diabetes include no chest pain, no polydipsia, no polyphagia and no polyuria. There are no hypoglycemic complications. Symptoms are improving. Diabetic complications include a CVA and nephropathy. Risk factors for coronary artery disease include diabetes mellitus, dyslipidemia, hypertension, obesity and sedentary lifestyle. Current diabetic treatment includes insulin injections. Compliance with diabetes treatment: She has history of noncompliance . Her weight is increasing steadily. She is following a generally unhealthy diet. When asked about meal planning, she reported none. She has not had a previous visit with a dietitian. She never (She has chronic back problem.) participates in exercise. Blood glucose monitoring compliance is adequate. Her breakfast blood glucose range is generally 130-140 mg/dl. Her lunch blood glucose range is generally 130-140 mg/dl. Her dinner blood glucose range is generally 140-180 mg/dl. Her bedtime blood glucose range is generally 140-180 mg/dl. Her overall blood glucose range is 140-180 mg/dl. (She presents with improving glycemic profile, A1c of 8%, improving from 8.7%.   ) An ACE inhibitor/angiotensin II receptor blocker is being taken.  Hyperlipidemia This is a chronic problem. The current episode started more than 1 year ago. The problem is uncontrolled. Exacerbating diseases include diabetes and obesity. Pertinent negatives  include no chest pain, leg pain, myalgias or shortness of breath. Current antihyperlipidemic treatment includes statins. Compliance problems: She had history of noncompliance.  Risk factors for coronary artery disease include dyslipidemia, diabetes mellitus, hypertension, obesity and a sedentary lifestyle.  Hypertension This is a chronic problem. The current episode started more than 1 year ago. The problem is uncontrolled. Pertinent negatives include no chest pain, headaches, palpitations or shortness of breath. Risk factors for coronary artery disease include diabetes mellitus, dyslipidemia, obesity and sedentary lifestyle. Past treatments include angiotensin blockers. Hypertensive end-organ damage includes CVA.     Review of systems  Constitutional: + Minimally fluctuating body weight,  current  Body mass index is 36.06 kg/m. , no fatigue, no subjective hyperthermia, no subjective hypothermia Eyes: no blurry vision, no xerophthalmia ENT: no sore throat, no nodules palpated in throat, no dysphagia/odynophagia, no hoarseness Cardiovascular: no Chest Pain, no Shortness of Breath, no palpitations, no leg swelling Respiratory: no cough, no shortness of breath Gastrointestinal: no Nausea/Vomiting/Diarhhea Musculoskeletal: no muscle/joint aches Skin: no rashes,  no hyperemia Neurological: no tremors, no numbness, no tingling, no dizziness Psychiatric: no depression, no anxiety    Objective:    BP (!) 177/95   Pulse 69   Ht 5' 1.5" (1.562 m)   Wt 194 lb (88 kg)   BMI 36.06 kg/m   Wt Readings from Last 3 Encounters:  12/11/19 194 lb (88 kg)  07/07/19 198 lb (89.8 kg)  08/18/18 198 lb (89.8 kg)     Physical Exam- Limited  Constitutional:  Body mass index is 36.06 kg/m. , not in acute distress, normal state of mind Eyes:  EOMI, no exophthalmos Neck: Supple Thyroid: No gross goiter Respiratory: Adequate breathing efforts Musculoskeletal: no gross deformities, strength intact in all  four extremities, no gross restriction of joint movements Skin:  no rashes, no hyperemia Neurological: no tremor with outstretched hands,     Results for orders placed or performed in visit on 12/11/19  HgB A1c  Result Value Ref Range   Hemoglobin A1C 8.0 (A) 4.0 - 5.6 %   HbA1c POC (<> result, manual entry)     HbA1c, POC (prediabetic range)     HbA1c, POC (controlled diabetic range)     Diabetic Labs (most recent): Lab Results  Component Value Date   HGBA1C 8.0 (A) 12/11/2019   HGBA1C 8.7 (H) 07/31/2019   HGBA1C 9.0 (H) 04/26/2019   Lipid Panel     Component Value Date/Time   CHOL 125 07/31/2019 0000   TRIG 92 07/31/2019 0000   HDL 40 07/31/2019 0000   CHOLHDL 4.8 09/30/2017 0915   VLDL 25 07/23/2016 0856   LDLCALC 67 07/31/2019 0000   LDLCALC 148 (H) 09/30/2017 0915   LDLDIRECT 135.5 05/02/2012 0945      Assessment & Plan:   1. Uncontrolled type 2 diabetes mellitus with other circulatory complication, with long-term current use of insulin ([place]) - her diabetes is  complicated by carotid artery stenosis and mild renal insufficiency, and history of noncompliance / non-aherence, patient remains at extremely high risk for more acute and chronic complications of diabetes which include CAD, CVA, CKD, retinopathy, and neuropathy. These are all discussed in detail with the patient.  -She continued to engage properly and saw improvement in her A1c to 8%, overall improving from 10.7%.   She did not document or report hypoglycemia.    - Her recent labs reviewed. - I have re-counseled the patient on diet management and weight loss  by adopting a carbohydrate restricted / protein rich  Diet.  - she  admits there is a room for improvement in her diet and drink choices. -  Suggestion is made for her to avoid simple carbohydrates  from her diet including Cakes, Sweet Desserts / Pastries, Ice Cream, Soda (diet and regular), Sweet Tea, Candies, Chips, Cookies, Sweet Pastries,  Store  Bought Juices, Alcohol in Excess of  1-2 drinks a day, Artificial Sweeteners, Coffee Creamer, and "Sugar-free" Products. This will help patient to have stable blood glucose profile and potentially avoid unintended weight gain.  - Patient is advised to stick to a routine mealtimes to eat 3 meals  a day and avoid unnecessary snacks ( to snack only to correct hypoglycemia).  - I have approached patient with the following individualized plan to manage diabetes and patient agrees.  -She has chronically uncontrolled type 2 diabetes.  She has had a history of noncompliance /nonadherence and patient still easily disengages from self-care. -She has trouble affording co-pays for insulin analogs, however, she did reasonably well  on Novolin 70/30 , and wishes to stay on this regimen for cost reasons.  -She has near target glycemic profile at fasting, advised to continue Novolin  70/30   35 units with breakfast and 35 units with supper for  pre-meal blood glucose above 90 mg/dL. -She is advised to start monitoring blood glucose 4 times a day-before meals and at bedtime .  -She may benefit from low-dose glipizide to achieve better postprandial glycemic profile.  I discussed and added glipizide 2.5 mg XL p.o. daily at breakfast.   - She would have benefited from continuous glucose monitoring, however her insurance did not provide coverage for the prescription.   -Adjustment parameters for hypo and hyperglycemia were given in a written document to patient. -Patient is encouraged to call clinic for blood glucose levels less than 70 or above 300 mg /dl. - She does not tolerate metformin.   - Patient specific target  for A1c; LDL, HDL, Triglycerides, and  Waist Circumference were discussed in detail.  2) BP/HTN:  she is advised to home monitor blood pressure and report if > 140/90 on 2 separate readings.    She is advised to continue her current blood pressure medications including  losartan-hydrochlorothiazide, amlodipine.    3) Lipids/HPL: She has severe dyslipidemia, LDLnot controlled to target.  She has not been taking her statins due to diffuse arthralgias as a side effect of atorvastatin.  Her insurance did not provide coverage for Repatha.  She is advised to resume her Crestor 10 mg p.o. nightly.  She is counseled on effects of Crestor.   4)  Weight/Diet:  CDE consult has been requested, exercise, and carbohydrates information provided.  5) Chronic Care/Health Maintenance:  -Patient  is  on ACEI/ARB and Statin medications and encouraged to continue to follow up with Ophthalmology, Podiatrist at least yearly or according to recommendations, and advised to  stay away from smoking. I have recommended yearly flu vaccine and pneumonia vaccination at least every 5 years; moderate intensity exercise for up to 150 minutes weekly; and  sleep for at least 7 hours a day.  - I advised patient to maintain close follow up with Sinda Du, MD for primary care needs.  - Time spent on this patient care encounter:  35 min, of which > 50% was spent in  counseling and the rest reviewing her blood glucose logs , discussing her hypoglycemia and hyperglycemia episodes, reviewing her current and  previous labs / studies  ( including abstraction from other facilities) and medications  doses and developing a  long term treatment plan and documenting her care.   Please refer to Patient Instructions for Blood Glucose Monitoring and Insulin/Medications Dosing Guide"  in media tab for additional information. Please  also refer to " Patient Self Inventory" in the Media  tab for reviewed elements of pertinent patient history.  Stacy [name] participated in the discussions, expressed understanding, and voiced agreement with the above plans.  All questions were answered to her satisfaction. she is encouraged to contact clinic should she have any questions or concerns prior to her return  visit.  Follow up plan: -Return in about 4 months (around 04/11/2020) for Bring Meter and Logs- A1c in Office, Follow up with Pre-visit Labs.  Glade Lloyd, MD Phone: [phone]  Fax: [phone]  This note was partially dictated with voice recognition software. Similar sounding words can be transcribed inadequately or may not  be corrected upon review.  12/11/2019, 1:59 PM

## 2020-01-03 ENCOUNTER — Ambulatory Visit: Payer: Medicare Other | Admitting: Family Medicine

## 2020-01-10 ENCOUNTER — Other Ambulatory Visit: Payer: Self-pay

## 2020-01-10 ENCOUNTER — Ambulatory Visit (INDEPENDENT_AMBULATORY_CARE_PROVIDER_SITE_OTHER): Payer: Medicare Other | Admitting: Family Medicine

## 2020-01-10 ENCOUNTER — Other Ambulatory Visit (HOSPITAL_COMMUNITY): Payer: Self-pay | Admitting: Family Medicine

## 2020-01-10 VITALS — BP 157/95 | HR 79 | Temp 97.6°F | Ht 60.5 in | Wt 192.2 lb

## 2020-01-10 DIAGNOSIS — I10 Essential (primary) hypertension: Secondary | ICD-10-CM | POA: Diagnosis not present

## 2020-01-10 DIAGNOSIS — Z1231 Encounter for screening mammogram for malignant neoplasm of breast: Secondary | ICD-10-CM | POA: Diagnosis not present

## 2020-01-10 DIAGNOSIS — Z1211 Encounter for screening for malignant neoplasm of colon: Secondary | ICD-10-CM

## 2020-01-10 DIAGNOSIS — E782 Mixed hyperlipidemia: Secondary | ICD-10-CM

## 2020-01-10 NOTE — Patient Instructions (Addendum)
Mammogram scheduled for May 24,21 @11 :00 patient informed in office  Bring blood pressure monitor to office-nurse visit Schedule a mammogram Send cologuard If you have lab work done today you will be contacted with your lab results within the next 2 weeks.  If you have not heard from [place] then please contact us. The fastest way to get your results is to register for My Chart.   IF you received an x-ray today, you will receive an invoice from [facility]. Please contact [facility] at [phone] with questions or concerns regarding your invoice.   IF you received labwork today, you will receive an invoice from [facility]. Please contact [facility] at [phone] with questions or concerns regarding your invoice.   Our billing staff will not be able to assist you with questions regarding bills from these companies.  You will be contacted with the lab results as soon as they are available. The fastest way to get your results is to activate your My Chart account. Instructions are located on the last page of this paperwork. If you have not heard from [place] regarding the results in 2 weeks, please contact this office.

## 2020-01-10 NOTE — Progress Notes (Signed)
New Patient Office Visit  Subjective:  Patient ID: Stacy [name], female    DOB: 1955/10/22  Age: 64 y.o. MRN: [id number]  CC:  Chief Complaint  Patient presents with  . Follow-up    est care and f/u on bp. Patient states she sees Dr Dorris Fetch for her diabetes  HTN/Hyperlipidemia  HPI Stacy [name] presents forHTN/Hyperlipidemia-needs medication refilled Hyperlipidemia-crestor -takes nightly-LDL 36 HTN-Losartan/HCTZ-50/12.5mg /Norvasc 5mg -no concerns-no headaches, no visual change, no edema Bought new bp monitor but unsure if working correctly or how to operate Past Medical History:  Diagnosis Date  . Cerebral infarction, unspecified ([place])   . Chronic back pain   . Diabetes mellitus   . Essential (primary) hypertension   . HTN (hypertension)   . Hyperlipidemia   . Lumbago   . Obesity, unspecified   . Osteoarthritis   . Other hyperlipidemia   . Sciatica, right side   . Stroke ([place])   . Sudden visual loss, left eye   . Type 2 diabetes mellitus with unspecified diabetic retinopathy without macular edema (HCC)   . Type 2 diabetes mellitus without complications [facility])     Past Surgical History:  Procedure Laterality Date  . carpel tunnel release     bilateral  . CESAREAN SECTION    . COLONOSCOPY  2006   normal, Dr. Benson [place]  . ESOPHAGOGASTRODUODENOSCOPY  2006   normal, Dr. Benson [place]    Family History  Problem Relation Age of Onset  . Diabetes Mother   . Heart disease Mother   . Hyperlipidemia Mother   . Hypertension Mother   . Heart attack Mother   . Diabetes Father   . Heart disease Father   . Hyperlipidemia Father   . Hypertension Father   . Heart attack Father   . Heart disease Brother   . Liver disease Neg Hx   . Colon cancer Neg Hx   . GI problems Neg Hx   . Stroke Neg Hx     Social History   Socioeconomic History  . Marital status: Divorced    Spouse name: Not on file  . Number of children: Not on file  . Years of education: Not on file  . Highest  education level: Not on file  Occupational History    Employer: [employer]    Comment: out due to back injury  Tobacco Use  . Smoking status: Never Smoker  . Smokeless tobacco: Never Used  Substance and Sexual Activity  . Alcohol use: No    Alcohol/week: 0.0 standard drinks  . Drug use: No  . Sexual activity: Never  Other Topics Concern  . Not on file  Social History Narrative  . Not on file   Social Determinants of Health   Financial Resource Strain:   . Difficulty of Paying Living Expenses:   Food Insecurity:   . Worried About Charity fundraiser in the Last Year:   . Arboriculturist in the Last Year:   Transportation Needs:   . Film/video editor (Medical):   Marland Kitchen Lack of Transportation (Non-Medical):   Physical Activity:   . Days of Exercise per Week:   . Minutes of Exercise per Session:   Stress:   . Feeling of Stress :   Social Connections:   . Frequency of Communication with Friends and Family:   . Frequency of Social Gatherings with Friends and Family:   . Attends Religious Services:   . Active Member of Clubs or Organizations:   .  Attends Archivist Meetings:   Marland Kitchen Marital Status:   Intimate Partner Violence:   . Fear of Current or Ex-Partner:   . Emotionally Abused:   Marland Kitchen Physically Abused:   . Sexually Abused:     ROS Review of Systems  Constitutional: Negative.   Respiratory: Negative.   Cardiovascular: Negative.   Gastrointestinal: Negative.   Endocrine:       DM  Genitourinary: Negative.   Allergic/Immunologic: Negative.   Neurological: Negative.   Hematological: Negative.   Psychiatric/Behavioral: Negative.     Objective:   Today's Vitals: BP (!) 157/95 (BP Location: Right Arm, Patient Position: Sitting, Cuff Size: Large)   Pulse 79   Temp 97.6 F (36.4 C) (Temporal)   Ht 5' 0.5" (1.537 m)   Wt 192 lb 3.2 oz (87.2 kg)   SpO2 97%   BMI 36.92 kg/m   Physical Exam Constitutional:      Appearance: Normal appearance.   HENT:     Head: Normocephalic.  Cardiovascular:     Rate and Rhythm: Normal rate and regular rhythm.     Pulses: Normal pulses.     Heart sounds: Normal heart sounds.  Pulmonary:     Effort: Pulmonary effort is normal.     Breath sounds: Normal breath sounds.  Musculoskeletal:     Cervical back: Normal range of motion.  Neurological:     General: No focal deficit present.     Mental Status: She is alert and oriented to person, place, and time.  Psychiatric:        Mood and Affect: Mood normal.        Behavior: Behavior normal.       Outpatient Encounter Medications as of 01/10/2020  Medication Sig  . amLODipine (NORVASC) 5 MG tablet Take 5 mg by mouth daily.  . B-D UF III MINI PEN NEEDLES 31G X 5 MM MISC AS DIRECTED DX:250.0  . clopidogrel (PLAVIX) 75 MG tablet Take 75 mg by mouth daily.  Marland Kitchen glipiZIDE (GLUCOTROL XL) 2.5 MG 24 hr tablet Take 1 tablet (2.5 mg total) by mouth daily with breakfast.  . glucose blood (FREESTYLE TEST STRIPS) test strip USE TO TEST BLOOD SUGAR FOUR TIMES DAILY AS DIRECTED.  Marland Kitchen INSULIN SYRINGE .5CC/29G 29G X 1/2" 0.5 ML MISC USE TO INJECT INSULIN TWICE DAILY  . NOVOLIN 70/30 (70-30) 100 UNIT/ML injection INJECT 35 UNITS UNDER THE SKIN WITH BREAKFAST AND INJECT 35 UNITS UNDER THE SKIN WITH SUPPER WHEN GLUCOSE READINGS ARE ABOVE 90  . rosuvastatin (CRESTOR) 10 MG tablet Take 10 mg by mouth daily.  Marland Kitchen losartan-hydrochlorothiazide (HYZAAR) 50-12.5 MG tablet Take 1 tablet by mouth daily.   No facility-administered encounter medications on file as of 01/10/2020.   1. Essential (primary) hypertension Reviewed labwork 3/21, 10/20-Hyzaar/amdlodipine-use monitor to check bp at home-goal 130/80  2. Mixed hyperlipidemia crestor-LDL 67  3. Encounter for screening mammogram for malignant neoplasm of breast  - MM Digital Screening; Future  4. Colon cancer screening - Cologuard Follow-up:   Hannah Beat, MD

## 2020-01-15 ENCOUNTER — Encounter: Payer: Self-pay | Admitting: Cardiology

## 2020-01-15 ENCOUNTER — Ambulatory Visit (INDEPENDENT_AMBULATORY_CARE_PROVIDER_SITE_OTHER): Payer: Medicare Other | Admitting: Cardiology

## 2020-01-15 ENCOUNTER — Other Ambulatory Visit: Payer: Self-pay

## 2020-01-15 VITALS — BP 146/74 | HR 85 | Ht 61.0 in | Wt 195.0 lb

## 2020-01-15 DIAGNOSIS — R0602 Shortness of breath: Secondary | ICD-10-CM | POA: Diagnosis not present

## 2020-01-15 NOTE — Progress Notes (Signed)
Clinical Summary Stacy [name] is a 64 y.o.female seen today for follow up of the following medical problems.   1. SOB/DOE - ongoing for a few months - notes was sedentary during pandemic - starting to get active [place] with walking about 3 blocks.  - no recent chest pain - occasiaional LE edema of and on for year   2016 echo: LVEF 65-70%, no WMAs, grade II diastolic function   CAD risk factors: DM2, HTN, HL, father with CAD in early 60s  07/2019 echo LVEF 60-65%, moderate LVH, normal RV, - breathing is stable. DOE at 3 blocks. Can DOE with activitis in the yard.   2. HTN - has not taken bp meds yet today      SH: daughter is a Chemical engineer, works [employer] Past Medical History:  Diagnosis Date  . Cerebral infarction, unspecified ([place])   . Chronic back pain   . Diabetes mellitus   . Essential (primary) hypertension   . HTN (hypertension)   . Hyperlipidemia   . Lumbago   . Obesity, unspecified   . Osteoarthritis   . Other hyperlipidemia   . Sciatica, right side   . Stroke ([place])   . Sudden visual loss, left eye   . Type 2 diabetes mellitus with unspecified diabetic retinopathy without macular edema (HCC)   . Type 2 diabetes mellitus without complications (HCC)      No Known Allergies   Current Outpatient Medications  Medication Sig Dispense Refill  . amLODipine (NORVASC) 5 MG tablet Take 5 mg by mouth daily.    . B-D UF III MINI PEN NEEDLES 31G X 5 MM MISC AS DIRECTED DX:250.0 100 each 10  . clopidogrel (PLAVIX) 75 MG tablet Take 75 mg by mouth daily.    Marland Kitchen glipiZIDE (GLUCOTROL XL) 2.5 MG 24 hr tablet Take 1 tablet (2.5 mg total) by mouth daily with breakfast. 30 tablet 3  . glucose blood (FREESTYLE TEST STRIPS) test strip USE TO TEST BLOOD SUGAR FOUR TIMES DAILY AS DIRECTED. 100 each 2  . INSULIN SYRINGE .5CC/29G 29G X 1/2" 0.5 ML MISC USE TO INJECT INSULIN TWICE DAILY 100 each 3  . losartan-hydrochlorothiazide (HYZAAR) 50-12.5 MG  tablet Take 1 tablet by mouth daily. 30 tablet 2  . NOVOLIN 70/30 (70-30) 100 UNIT/ML injection INJECT 35 UNITS UNDER THE SKIN WITH BREAKFAST AND INJECT 35 UNITS UNDER THE SKIN WITH SUPPER WHEN GLUCOSE READINGS ARE ABOVE 90 20 mL 2  . rosuvastatin (CRESTOR) 10 MG tablet Take 10 mg by mouth daily.     No current facility-administered medications for this visit.     Past Surgical History:  Procedure Laterality Date  . carpel tunnel release     bilateral  . CESAREAN SECTION    . COLONOSCOPY  2006   normal, Dr. Benson Norway  . ESOPHAGOGASTRODUODENOSCOPY  2006   normal, Dr. Benson Norway     No Known Allergies    Family History  Problem Relation Age of Onset  . Diabetes Mother   . Heart disease Mother   . Hyperlipidemia Mother   . Hypertension Mother   . Heart attack Mother   . Diabetes Father   . Heart disease Father   . Hyperlipidemia Father   . Hypertension Father   . Heart attack Father   . Heart disease Brother   . Liver disease Neg Hx   . Colon cancer Neg Hx   . GI problems Neg Hx   . Stroke Neg  Hx      Social History Stacy [name] reports that she has never smoked. She has never used smokeless tobacco. Stacy [name] reports no history of alcohol use.   Review of Systems CONSTITUTIONAL: No weight loss, fever, chills, weakness or fatigue.  HEENT: Eyes: No visual loss, blurred vision, double vision or yellow sclerae.No hearing loss, sneezing, congestion, runny nose or sore throat.  SKIN: No rash or itching.  CARDIOVASCULAR: per hpi RESPIRATORY: No shortness of breath, cough or sputum.  GASTROINTESTINAL: No anorexia, nausea, vomiting or diarrhea. No abdominal pain or blood.  GENITOURINARY: No burning on urination, no polyuria NEUROLOGICAL: No headache, dizziness, syncope, paralysis, ataxia, numbness or tingling in the extremities. No change in bowel or bladder control.  MUSCULOSKELETAL: No muscle, back pain, joint pain or stiffness.  LYMPHATICS: No enlarged nodes. No history of  splenectomy.  PSYCHIATRIC: No history of depression or anxiety.  ENDOCRINOLOGIC: No reports of sweating, cold or heat intolerance. No polyuria or polydipsia.  Marland Kitchen   Physical Examination Today's Vitals   01/15/20 0900  BP: (!) 146/74  Pulse: 85  SpO2: 99%  Weight: 195 lb (88.5 kg)  Height: 5\' 1"  (1.549 m)   Body mass index is 36.84 kg/m.  Gen: resting comfortably, no acute distress HEENT: no scleral icterus, pupils equal round and reactive, no palptable cervical adenopathy,  CV: RRR, no m/r/g, no jvd Resp: Clear to auscultation bilaterally GI: abdomen is soft, non-tender, non-distended, normal bowel sounds, no hepatosplenomegaly MSK: extremities are warm, no edema.  Skin: warm, no rash Neuro:  no focal deficits Psych: appropriate affect   Diagnostic Studies 07/2019 echo IMPRESSIONS    1. Left ventricular ejection fraction, by visual estimation, is 60 to  65%. The left ventricle has normal function. Normal left ventricular size.  Left ventricular septal wall thickness was moderately increased. There is  moderately increased left  ventricular hypertrophy.  2. Global right ventricle has normal systolic function.The right  ventricular size is normal. No increase in right ventricular wall  thickness.  3. Left atrial size was mildly dilated.  4. Right atrial size was normal.  5. The mitral valve is normal in structure. Trace mitral valve  regurgitation. No evidence of mitral stenosis.  6. The tricuspid valve is normal in structure. Tricuspid valve  regurgitation was not visualized by color flow Doppler.  7. The aortic valve is tricuspid Aortic valve regurgitation was not  visualized by color flow Doppler. Mild to moderate aortic valve  sclerosis/calcification without any evidence of aortic stenosis.  8. The pulmonic valve was normal in structure. Pulmonic valve  regurgitation is not visualized by color flow Doppler.  9. The inferior vena cava is normal in size  with greater than 50%  respiratory variability, suggesting right atrial pressure of 3 mmHg.        Assessment and Plan  1. SOB/DOE - unclear etiology. Several CAD/cardiac risk factors with baselne abormal EKG - echo fairly benign with normal LVEF, had some LVH - plan for lexiscan to further evaluate given multiple CAD risk factors       Arnoldo Lenis, M.D.

## 2020-01-15 NOTE — Patient Instructions (Signed)
Medication Instructions:  Your physician recommends that you continue on your current medications as directed. Please refer to the Current Medication list given to you today.  *If you need a refill on your cardiac medications before your next appointment, please call your pharmacy*   Lab Work: None today If you have labs (blood work) drawn today and your tests are completely normal, you will receive your results only by: Marland Kitchen MyChart Message (if you have MyChart) OR . A paper copy in the mail If you have any lab test that is abnormal or we need to change your treatment, we will call you to review the results.   Testing/Procedures: Your physician has requested that you have a lexiscan myoview. For further information please visit [email or web address]. Please follow instruction sheet, as given.     Follow-Up: At [facility], you and your health needs are our priority.  As part of our continuing mission to provide you with exceptional heart care, we have created designated Provider Care Teams.  These Care Teams include your primary Cardiologist (physician) and Advanced Practice Providers (APPs -  Physician Assistants and Nurse Practitioners) who all work together to provide you with the care you need, when you need it.  We recommend signing up for the patient portal called "MyChart".  Sign up information is provided on this After Visit Summary.  MyChart is used to connect with patients for Virtual Visits (Telemedicine).  Patients are able to view lab/test results, encounter notes, upcoming appointments, etc.  Non-urgent messages can be sent to your provider as well.   To learn more about what you can do with MyChart, go to [email or web address].    Your next appointment:  To be determined after lexiscan.We will call you with results.       Thank you for choosing [facility] !

## 2020-01-18 ENCOUNTER — Ambulatory Visit (HOSPITAL_COMMUNITY): Payer: Medicare Other

## 2020-01-19 ENCOUNTER — Other Ambulatory Visit: Payer: Self-pay

## 2020-01-19 MED ORDER — LOSARTAN POTASSIUM-HCTZ 100-25 MG PO TABS: 1.0000 | ORAL_TABLET | Freq: Every day | ORAL | 0 refills | Status: DC

## 2020-01-25 DIAGNOSIS — H35033 Hypertensive retinopathy, bilateral: Secondary | ICD-10-CM | POA: Diagnosis not present

## 2020-01-25 DIAGNOSIS — E113592 Type 2 diabetes mellitus with proliferative diabetic retinopathy without macular edema, left eye: Secondary | ICD-10-CM | POA: Diagnosis not present

## 2020-01-25 DIAGNOSIS — E113511 Type 2 diabetes mellitus with proliferative diabetic retinopathy with macular edema, right eye: Secondary | ICD-10-CM | POA: Diagnosis not present

## 2020-01-25 DIAGNOSIS — H3582 Retinal ischemia: Secondary | ICD-10-CM | POA: Diagnosis not present

## 2020-01-29 DIAGNOSIS — Z1212 Encounter for screening for malignant neoplasm of rectum: Secondary | ICD-10-CM | POA: Diagnosis not present

## 2020-01-29 DIAGNOSIS — Z1211 Encounter for screening for malignant neoplasm of colon: Secondary | ICD-10-CM | POA: Diagnosis not present

## 2020-01-31 ENCOUNTER — Encounter (HOSPITAL_COMMUNITY): Payer: Medicare Other

## 2020-02-05 LAB — COLOGUARD
COLOGUARD: NEGATIVE
Cologuard: NEGATIVE
Cologuard: NEGATIVE

## 2020-02-06 ENCOUNTER — Encounter: Payer: Self-pay | Admitting: Family Medicine

## 2020-02-11 ENCOUNTER — Other Ambulatory Visit: Payer: Self-pay | Admitting: "Endocrinology

## 2020-02-11 DIAGNOSIS — E1159 Type 2 diabetes mellitus with other circulatory complications: Secondary | ICD-10-CM

## 2020-03-06 ENCOUNTER — Ambulatory Visit (HOSPITAL_COMMUNITY)
Admission: RE | Admit: 2020-03-06 | Discharge: 2020-03-06 | Disposition: A | Discharge status: Home / Self Care | Payer: Medicare Other | Source: Ambulatory Visit | Attending: Family Medicine | Admitting: Family Medicine

## 2020-03-06 ENCOUNTER — Other Ambulatory Visit: Payer: Self-pay

## 2020-03-06 DIAGNOSIS — Z1231 Encounter for screening mammogram for malignant neoplasm of breast: Secondary | ICD-10-CM

## 2020-04-15 ENCOUNTER — Other Ambulatory Visit: Payer: Self-pay | Admitting: "Endocrinology

## 2020-04-18 ENCOUNTER — Ambulatory Visit: Payer: Medicare Other | Admitting: "Endocrinology

## 2020-04-19 DIAGNOSIS — E1159 Type 2 diabetes mellitus with other circulatory complications: Secondary | ICD-10-CM | POA: Diagnosis not present

## 2020-04-20 LAB — MICROALBUMIN / CREATININE URINE RATIO
Creatinine, Urine: 242 mg/dL (ref 20–275)
Microalb Creat Ratio: 7 ug/mg{creat}
Microalb, Ur: 1.7 mg/dL

## 2020-04-20 LAB — COMPLETE METABOLIC PANEL WITHOUT GFR
AG Ratio: 1.6 (calc) (ref 1.0–2.5)
ALT: 13 U/L (ref 6–29)
AST: 14 U/L (ref 10–35)
Albumin: 4.2 g/dL (ref 3.6–5.1)
Alkaline phosphatase (APISO): 46 U/L (ref 37–153)
BUN: 10 mg/dL (ref 7–25)
CO2: 28 mmol/L (ref 20–32)
Calcium: 9.3 mg/dL (ref 8.6–10.4)
Chloride: 105 mmol/L (ref 98–110)
Creat: 0.71 mg/dL (ref 0.50–0.99)
GFR, Est African American: 104 mL/min/1.73m2
GFR, Est Non African American: 89 mL/min/1.73m2
Globulin: 2.6 g/dL (ref 1.9–3.7)
Glucose, Bld: 160 mg/dL — ABNORMAL HIGH (ref 65–99)
Potassium: 4.2 mmol/L (ref 3.5–5.3)
Sodium: 139 mmol/L (ref 135–146)
Total Bilirubin: 0.5 mg/dL (ref 0.2–1.2)
Total Protein: 6.8 g/dL (ref 6.1–8.1)

## 2020-04-20 LAB — VITAMIN D 25 HYDROXY (VIT D DEFICIENCY, FRACTURES): Vit D, 25-Hydroxy: 17 ng/mL — ABNORMAL LOW (ref 30–100)

## 2020-04-20 LAB — T4, FREE: Free T4: 1 ng/dL (ref 0.8–1.8)

## 2020-04-20 LAB — TSH: TSH: 2.06 m[IU]/L (ref 0.40–4.50)

## 2020-04-25 ENCOUNTER — Encounter: Payer: Self-pay | Admitting: "Endocrinology

## 2020-04-25 ENCOUNTER — Ambulatory Visit (INDEPENDENT_AMBULATORY_CARE_PROVIDER_SITE_OTHER): Payer: Medicare Other | Admitting: "Endocrinology

## 2020-04-25 ENCOUNTER — Other Ambulatory Visit: Payer: Self-pay

## 2020-04-25 VITALS — BP 138/75 | HR 64 | Ht 61.0 in | Wt 191.8 lb

## 2020-04-25 DIAGNOSIS — E782 Mixed hyperlipidemia: Secondary | ICD-10-CM | POA: Diagnosis not present

## 2020-04-25 DIAGNOSIS — I10 Essential (primary) hypertension: Secondary | ICD-10-CM

## 2020-04-25 DIAGNOSIS — E559 Vitamin D deficiency, unspecified: Secondary | ICD-10-CM | POA: Diagnosis not present

## 2020-04-25 DIAGNOSIS — E1159 Type 2 diabetes mellitus with other circulatory complications: Secondary | ICD-10-CM

## 2020-04-25 LAB — POCT GLYCOSYLATED HEMOGLOBIN (HGB A1C): Hemoglobin A1C: 9.1 % — AB (ref 4.0–5.6)

## 2020-04-25 MED ORDER — VITAMIN D3 125 MCG (5000 UT) PO CAPS: 5000.0000 [IU] | ORAL_CAPSULE | Freq: Every day | ORAL | 1 refills | Status: DC

## 2020-04-25 MED ORDER — NOVOLIN 70/30 (70-30) 100 UNIT/ML ~~LOC~~ SUSP: 40.0000 [IU] | Freq: Two times a day (BID) | SUBCUTANEOUS | 2 refills | Status: DC

## 2020-04-25 NOTE — Patient Instructions (Signed)

## 2020-04-25 NOTE — Progress Notes (Signed)
04/25/2020                 Endocrinology follow-up note   Subjective:    Patient ID: Stacy [name], female    DOB: [birth year], PCP Corum, Rex Kras, MD   Past Medical History:  Diagnosis Date  . Cerebral infarction, unspecified ([place])   . Chronic back pain   . Diabetes mellitus   . Essential (primary) hypertension   . HTN (hypertension)   . Hyperlipidemia   . Lumbago   . Obesity, unspecified   . Osteoarthritis   . Other hyperlipidemia   . Sciatica, right side   . Stroke ([place])   . Sudden visual loss, left eye   . Type 2 diabetes mellitus with unspecified diabetic retinopathy without macular edema (HCC)   . Type 2 diabetes mellitus without complications [facility])    Past Surgical History:  Procedure Laterality Date  . carpel tunnel release     bilateral  . CESAREAN SECTION    . COLONOSCOPY  2006   normal, Dr. Benson Norway  . ESOPHAGOGASTRODUODENOSCOPY  2006   normal, Dr. Benson Norway   Social History   Socioeconomic History  . Marital status: Divorced    Spouse name: Not on file  . Number of children: Not on file  . Years of education: Not on file  . Highest education level: Not on file  Occupational History    Employer: [employer]    Comment: out due to back injury  Tobacco Use  . Smoking status: Never Smoker  . Smokeless tobacco: Never Used  Vaping Use  . Vaping Use: Never used  Substance and Sexual Activity  . Alcohol use: No    Alcohol/week: 0.0 standard drinks  . Drug use: No  . Sexual activity: Never  Other Topics Concern  . Not on file  Social History Narrative  . Not on file   Social Determinants of Health   Financial Resource Strain:   . Difficulty of Paying Living Expenses:   Food Insecurity:   . Worried About Charity fundraiser in the Last Year:   . Arboriculturist in the Last Year:   Transportation Needs:   . Film/video editor (Medical):   Marland Kitchen Lack of Transportation (Non-Medical):   Physical Activity:   . Days of Exercise per Week:   . Minutes  of Exercise per Session:   Stress:   . Feeling of Stress :   Social Connections:   . Frequency of Communication with Friends and Family:   . Frequency of Social Gatherings with Friends and Family:   . Attends Religious Services:   . Active Member of Clubs or Organizations:   . Attends Archivist Meetings:   Marland Kitchen Marital Status:    Outpatient Encounter Medications as of 04/25/2020  Medication Sig  . amLODipine (NORVASC) 5 MG tablet Take 5 mg by mouth daily.  . Cholecalciferol (VITAMIN D3) 125 MCG (5000 UT) CAPS Take 1 capsule (5,000 Units total) by mouth daily.  Marland Kitchen glipiZIDE (GLUCOTROL XL) 2.5 MG 24 hr tablet Take 1 tablet (2.5 mg total) by mouth daily with breakfast.  . glucose blood (FREESTYLE TEST STRIPS) test strip USE TO TEST BLOOD SUGAR FOUR TIMES DAILY AS DIRECTED.  Marland Kitchen insulin NPH-regular Human (NOVOLIN 70/30) (70-30) 100 UNIT/ML injection Inject 40 Units into the skin 2 (two) times daily with a meal.  . INSULIN SYRINGE .5CC/29G 29G X 1/2" 0.5 ML MISC USE TO INJECT INSULIN TWICE DAILY  . losartan-hydrochlorothiazide (  HYZAAR) 100-25 MG tablet TAKE 1 TABLET BY MOUTH DAILY  . rosuvastatin (CRESTOR) 10 MG tablet Take 10 mg by mouth daily.  . [DISCONTINUED] B-D UF III MINI PEN NEEDLES 31G X 5 MM MISC AS DIRECTED DX:250.0  . [DISCONTINUED] clopidogrel (PLAVIX) 75 MG tablet Take 75 mg by mouth daily.  . [DISCONTINUED] NOVOLIN 70/30 (70-30) 100 UNIT/ML injection INJECT 35 UNITS UNDER THE SKIN WITH BREAKFAST AND INJECT 35 UNITS UNDER THE SKIN WITH SUPPER WHEN GLUCOSE READINGS ARE ABOVE 90   No facility-administered encounter medications on file as of 04/25/2020.   ALLERGIES: No Known Allergies VACCINATION STATUS: Immunization History  Administered Date(s) Administered  . Influenza Whole 07/13/2007, 08/07/2009  . Influenza-Unspecified 07/19/2012, 07/28/2017, 08/22/2018, 10/12/2018, 07/12/2019  . PFIZER SARS-COV-2 Vaccination 01/03/2020  . Pneumococcal Conjugate-13 08/22/2018  .  Pneumococcal Polysaccharide-23 04/22/2012, 08/24/2019  . Td 01/11/2007    Diabetes She presents for her follow-up diabetic visit. She has type 2 diabetes mellitus. Onset time: She was diagnosed at approximate age of 19 years. Her disease course has been worsening. There are no hypoglycemic associated symptoms. Pertinent negatives for hypoglycemia include no confusion, headaches, pallor or seizures. Associated symptoms include polydipsia and polyuria. Pertinent negatives for diabetes include no chest pain and no polyphagia. There are no hypoglycemic complications. Symptoms are worsening. Diabetic complications include a CVA and nephropathy. Risk factors for coronary artery disease include diabetes mellitus, dyslipidemia, hypertension, obesity and sedentary lifestyle. Current diabetic treatment includes insulin injections. Compliance with diabetes treatment: She has history of noncompliance . Her weight is increasing steadily. She is following a generally unhealthy diet. When asked about meal planning, she reported none. She has not had a previous visit with a dietitian. She never (She has chronic back problem.) participates in exercise. Blood glucose monitoring compliance is inadequate. Her breakfast blood glucose range is generally 180-200 mg/dl. Her dinner blood glucose range is generally 180-200 mg/dl. Her bedtime blood glucose range is generally 180-200 mg/dl. Her overall blood glucose range is 180-200 mg/dl. (She presents with inadequate monitoring, her A1c is 9.1% at point-of-care increasing from 8%.  She denies hypoglycemia.    ) An ACE inhibitor/angiotensin II receptor blocker is being taken.  Hyperlipidemia This is a chronic problem. The current episode started more than 1 year ago. The problem is uncontrolled. Exacerbating diseases include diabetes and obesity. Pertinent negatives include no chest pain, leg pain, myalgias or shortness of breath. Current antihyperlipidemic treatment includes  statins. Compliance problems: She had history of noncompliance.  Risk factors for coronary artery disease include dyslipidemia, diabetes mellitus, hypertension, obesity and a sedentary lifestyle.  Hypertension This is a chronic problem. The current episode started more than 1 year ago. The problem is uncontrolled. Pertinent negatives include no chest pain, headaches, palpitations or shortness of breath. Risk factors for coronary artery disease include diabetes mellitus, dyslipidemia, obesity and sedentary lifestyle. Past treatments include angiotensin blockers. Hypertensive end-organ damage includes CVA.     Review of systems  Constitutional: + Minimally fluctuating body weight,  current  Body mass index is 36.24 kg/m. , no fatigue, no subjective hyperthermia, no subjective hypothermia Eyes: no blurry vision, no xerophthalmia ENT: no sore throat, no nodules palpated in throat, no dysphagia/odynophagia, no hoarseness Cardiovascular: no Chest Pain, no Shortness of Breath, no palpitations, no leg swelling Respiratory: no cough, no shortness of breath Gastrointestinal: no Nausea/Vomiting/Diarhhea Musculoskeletal: no muscle/joint aches Skin: no rashes, no hyperemia Neurological: no tremors, no numbness, no tingling, no dizziness Psychiatric: no depression, no anxiety    Objective:  BP 138/75   Pulse 64   Ht 5\' 1"  (1.549 m)   Wt 191 lb 12.8 oz (87 kg)   BMI 36.24 kg/m   Wt Readings from Last 3 Encounters:  04/25/20 191 lb 12.8 oz (87 kg)  01/15/20 195 lb (88.5 kg)  01/10/20 192 lb 3.2 oz (87.2 kg)     Physical Exam- Limited  Constitutional:  Body mass index is 36.24 kg/m. , not in acute distress, normal state of mind Eyes:  EOMI, no exophthalmos Neck: Supple Thyroid: No gross goiter Respiratory: Adequate breathing efforts Musculoskeletal: no gross deformities, strength intact in all four extremities, no gross restriction of joint movements Skin:  no rashes, no  hyperemia Neurological: no tremor with outstretched hands,    Results for orders placed or performed in visit on 04/25/20  HgB A1c  Result Value Ref Range   Hemoglobin A1C 9.1 (A) 4.0 - 5.6 %   HbA1c POC (<> result, manual entry)     HbA1c, POC (prediabetic range)     HbA1c, POC (controlled diabetic range)     Diabetic Labs (most recent): Lab Results  Component Value Date   HGBA1C 9.1 (A) 04/25/2020   HGBA1C 8.0 (A) 12/11/2019   HGBA1C 8.7 (H) 07/31/2019   Lipid Panel     Component Value Date/Time   CHOL 125 07/31/2019 0000   TRIG 92 07/31/2019 0000   HDL 40 07/31/2019 0000   CHOLHDL 4.8 09/30/2017 0915   VLDL 25 07/23/2016 0856   LDLCALC 67 07/31/2019 0000   LDLCALC 148 (H) 09/30/2017 0915   LDLDIRECT 135.5 05/02/2012 0945   CMP Latest Ref Rng & Units 04/19/2020 07/31/2019 04/26/2019  Glucose 65 - 99 mg/dL 160(H) 191(H) 270(H)  BUN 7 - 25 mg/dL 10 11 13   Creatinine 0.50 - 0.99 mg/dL 0.71 0.81 0.81  Sodium 135 - 146 mmol/L 139 141 136  Potassium 3.5 - 5.3 mmol/L 4.2 4.1 4.2  Chloride 98 - 110 mmol/L 105 107 101  CO2 20 - 32 mmol/L 28 29 28   Calcium 8.6 - 10.4 mg/dL 9.3 9.2 9.3  Total Protein 6.1 - 8.1 g/dL 6.8 7.0 7.2  Total Bilirubin 0.2 - 1.2 mg/dL 0.5 0.5 0.5  Alkaline Phos 25 - 125 - - -  AST 10 - 35 U/L 14 14 14   ALT 6 - 29 U/L 13 15 16       Assessment & Plan:   1. Uncontrolled type 2 diabetes mellitus with other circulatory complication, with long-term current use of insulin ([place]) - her diabetes is  complicated by carotid artery stenosis and mild renal insufficiency, and history of noncompliance / non-aherence, patient remains at extremely high risk for more acute and chronic complications of diabetes which include CAD, CVA, CKD, retinopathy, and neuropathy. These are all discussed in detail with the patient.  She presents with inadequate monitoring, her A1c is 9.1% at point-of-care increasing from 8%.  She denies hypoglycemia.    - Her recent labs  reviewed. - I have re-counseled the patient on diet management and weight loss  by adopting a carbohydrate restricted / protein rich  Diet.  - she  admits there is a room for improvement in her diet and drink choices. -  Suggestion is made for her to avoid simple carbohydrates  from her diet including Cakes, Sweet Desserts / Pastries, Ice Cream, Soda (diet and regular), Sweet Tea, Candies, Chips, Cookies, Sweet Pastries,  Store Bought Juices, Alcohol in Excess of  1-2 drinks a day, Artificial Sweeteners, Coffee  Creamer, and "Sugar-free" Products. This will help patient to have stable blood glucose profile and potentially avoid unintended weight gain.   - Patient is advised to stick to a routine mealtimes to eat 3 meals  a day and avoid unnecessary snacks ( to snack only to correct hypoglycemia).  - I have approached patient with the following individualized plan to manage diabetes and patient agrees.  -She has chronically uncontrolled type 2 diabetes.  She has had a history of noncompliance /nonadherence and patient still easily disengages from self-care. -She has trouble affording co-pays for insulin analogs, however, she did reasonably well on Novolin 70/30 , and wishes to stay on this regimen for cost reasons.  -She is advised to increase her Novolin 70/30 to 40 units with breakfast and 40 units with supper  for  pre-meal blood glucose above 90 mg/dL. -She is advised to start monitoring blood glucose 4 times a day-before meals and at bedtime .  -She is tolerating low-dose glipizide.  She is advised to continue  glipizide 2.5 mg XL p.o. daily at breakfast.   - She would have benefited from continuous glucose monitoring, however her insurance did not provide coverage for the prescription.   -Adjustment parameters for hypo and hyperglycemia were given in a written document to patient. -Patient is encouraged to call clinic for blood glucose levels less than 70 or above 300 mg /dl. - She does  not tolerate metformin.   - Patient specific target  for A1c; LDL, HDL, Triglycerides,  were discussed in detail.  2) BP/HTN:  Her blood pressure is controlled to target.   She is advised to continue her current blood pressure medications including losartan-hydrochlorothiazide, amlodipine.    3) Lipids/HPL: She has severe dyslipidemia, LDLnot controlled to target.  She has not been taking her statins due to diffuse arthralgias as a side effect of atorvastatin.  Her insurance did not provide coverage for Repatha.  She is advised to resume her Crestor 10 mg p.o. nightly.    She is counseled on effects of Crestor.   4)  Weight/Diet: Her BMI [id number] complicating her diabetes care.  She is a candidate for modest weight loss.  CDE consult has been requested, exercise, and carbohydrates information provided.  5) Chronic Care/Health Maintenance:  -Patient  is  on ACEI/ARB and Statin medications and encouraged to continue to follow up with Ophthalmology, Podiatrist at least yearly or according to recommendations, and advised to  stay away from smoking. I have recommended yearly flu vaccine and pneumonia vaccination at least every 5 years; moderate intensity exercise for up to 150 minutes weekly; and  sleep for at least 7 hours a day.  - I advised patient to maintain close follow up with Maryruth Hancock, MD for primary care needs.  - Time spent on this patient care encounter:  35 min, of which > 50% was spent in  counseling and the rest reviewing her blood glucose logs , discussing her hypoglycemia and hyperglycemia episodes, reviewing her current and  previous labs / studies  ( including abstraction from other facilities) and medications  doses and developing a  long term treatment plan and documenting her care.   Please refer to Patient Instructions for Blood Glucose Monitoring and Insulin/Medications Dosing Guide"  in media tab for additional information. Please  also refer to " Patient Self Inventory"  in the Media  tab for reviewed elements of pertinent patient history.  Stacy [name] participated in the discussions, expressed understanding, and voiced agreement  with the above plans.  All questions were answered to her satisfaction. she is encouraged to contact clinic should she have any questions or concerns prior to her return visit.   Follow up plan: -Return in about 4 months (around 08/26/2020) for Bring Meter and Logs- A1c in Office.  Glade Lloyd, MD Phone: [phone]  Fax: [phone]  This note was partially dictated with voice recognition software. Similar sounding words can be transcribed inadequately or may not  be corrected upon review.  04/25/2020, 10:57 AM

## 2020-04-29 ENCOUNTER — Telehealth: Payer: Self-pay | Admitting: "Endocrinology

## 2020-04-29 MED ORDER — VITAMIN D3 125 MCG (5000 UT) PO CAPS: 5000.0000 [IU] | ORAL_CAPSULE | Freq: Every day | ORAL | 1 refills | Status: AC

## 2020-04-29 NOTE — Telephone Encounter (Signed)
Rx resent.

## 2020-04-29 NOTE — Telephone Encounter (Signed)
Can you resend her Cholecalciferol (VITAMIN D3) 125 MCG (5000 UT) CAPS to [facility] on [address]. She went to pick it up and they did not have the RX

## 2020-05-20 DIAGNOSIS — Z9889 Other specified postprocedural states: Secondary | ICD-10-CM | POA: Diagnosis not present

## 2020-05-20 DIAGNOSIS — H3582 Retinal ischemia: Secondary | ICD-10-CM | POA: Diagnosis not present

## 2020-05-20 DIAGNOSIS — E113553 Type 2 diabetes mellitus with stable proliferative diabetic retinopathy, bilateral: Secondary | ICD-10-CM | POA: Diagnosis not present

## 2020-05-20 DIAGNOSIS — Z794 Long term (current) use of insulin: Secondary | ICD-10-CM | POA: Diagnosis not present

## 2020-05-20 DIAGNOSIS — H2513 Age-related nuclear cataract, bilateral: Secondary | ICD-10-CM | POA: Diagnosis not present

## 2020-05-30 DIAGNOSIS — E113511 Type 2 diabetes mellitus with proliferative diabetic retinopathy with macular edema, right eye: Secondary | ICD-10-CM | POA: Diagnosis not present

## 2020-05-30 DIAGNOSIS — H35033 Hypertensive retinopathy, bilateral: Secondary | ICD-10-CM | POA: Diagnosis not present

## 2020-05-30 DIAGNOSIS — H3582 Retinal ischemia: Secondary | ICD-10-CM | POA: Diagnosis not present

## 2020-05-30 DIAGNOSIS — E113592 Type 2 diabetes mellitus with proliferative diabetic retinopathy without macular edema, left eye: Secondary | ICD-10-CM | POA: Diagnosis not present

## 2020-06-08 ENCOUNTER — Other Ambulatory Visit: Payer: Self-pay | Admitting: "Endocrinology

## 2020-06-08 DIAGNOSIS — E1159 Type 2 diabetes mellitus with other circulatory complications: Secondary | ICD-10-CM

## 2020-06-12 DIAGNOSIS — H2513 Age-related nuclear cataract, bilateral: Secondary | ICD-10-CM | POA: Diagnosis not present

## 2020-06-12 DIAGNOSIS — H1132 Conjunctival hemorrhage, left eye: Secondary | ICD-10-CM | POA: Diagnosis not present

## 2020-06-25 DIAGNOSIS — M545 Low back pain: Secondary | ICD-10-CM | POA: Diagnosis not present

## 2020-06-25 DIAGNOSIS — M9905 Segmental and somatic dysfunction of pelvic region: Secondary | ICD-10-CM | POA: Diagnosis not present

## 2020-06-25 DIAGNOSIS — M9902 Segmental and somatic dysfunction of thoracic region: Secondary | ICD-10-CM | POA: Diagnosis not present

## 2020-06-25 DIAGNOSIS — M9903 Segmental and somatic dysfunction of lumbar region: Secondary | ICD-10-CM | POA: Diagnosis not present

## 2020-06-26 DIAGNOSIS — M545 Low back pain: Secondary | ICD-10-CM | POA: Diagnosis not present

## 2020-06-26 DIAGNOSIS — I10 Essential (primary) hypertension: Secondary | ICD-10-CM | POA: Diagnosis not present

## 2020-06-26 DIAGNOSIS — E1165 Type 2 diabetes mellitus with hyperglycemia: Secondary | ICD-10-CM | POA: Diagnosis not present

## 2020-06-26 DIAGNOSIS — E782 Mixed hyperlipidemia: Secondary | ICD-10-CM | POA: Diagnosis not present

## 2020-06-26 DIAGNOSIS — Z0189 Encounter for other specified special examinations: Secondary | ICD-10-CM | POA: Diagnosis not present

## 2020-06-26 DIAGNOSIS — E559 Vitamin D deficiency, unspecified: Secondary | ICD-10-CM | POA: Diagnosis not present

## 2020-07-01 DIAGNOSIS — M9902 Segmental and somatic dysfunction of thoracic region: Secondary | ICD-10-CM | POA: Diagnosis not present

## 2020-07-01 DIAGNOSIS — M545 Low back pain: Secondary | ICD-10-CM | POA: Diagnosis not present

## 2020-07-01 DIAGNOSIS — M9905 Segmental and somatic dysfunction of pelvic region: Secondary | ICD-10-CM | POA: Diagnosis not present

## 2020-07-01 DIAGNOSIS — M9903 Segmental and somatic dysfunction of lumbar region: Secondary | ICD-10-CM | POA: Diagnosis not present

## 2020-07-05 DIAGNOSIS — M9903 Segmental and somatic dysfunction of lumbar region: Secondary | ICD-10-CM | POA: Diagnosis not present

## 2020-07-05 DIAGNOSIS — M9905 Segmental and somatic dysfunction of pelvic region: Secondary | ICD-10-CM | POA: Diagnosis not present

## 2020-07-05 DIAGNOSIS — M9902 Segmental and somatic dysfunction of thoracic region: Secondary | ICD-10-CM | POA: Diagnosis not present

## 2020-07-05 DIAGNOSIS — M545 Low back pain: Secondary | ICD-10-CM | POA: Diagnosis not present

## 2020-07-08 DIAGNOSIS — M9902 Segmental and somatic dysfunction of thoracic region: Secondary | ICD-10-CM | POA: Diagnosis not present

## 2020-07-08 DIAGNOSIS — M9905 Segmental and somatic dysfunction of pelvic region: Secondary | ICD-10-CM | POA: Diagnosis not present

## 2020-07-08 DIAGNOSIS — M9903 Segmental and somatic dysfunction of lumbar region: Secondary | ICD-10-CM | POA: Diagnosis not present

## 2020-07-08 DIAGNOSIS — M545 Low back pain: Secondary | ICD-10-CM | POA: Diagnosis not present

## 2020-07-12 DIAGNOSIS — M9903 Segmental and somatic dysfunction of lumbar region: Secondary | ICD-10-CM | POA: Diagnosis not present

## 2020-07-12 DIAGNOSIS — M48061 Spinal stenosis, lumbar region without neurogenic claudication: Secondary | ICD-10-CM | POA: Diagnosis not present

## 2020-07-12 DIAGNOSIS — M9902 Segmental and somatic dysfunction of thoracic region: Secondary | ICD-10-CM | POA: Diagnosis not present

## 2020-07-12 DIAGNOSIS — M9905 Segmental and somatic dysfunction of pelvic region: Secondary | ICD-10-CM | POA: Diagnosis not present

## 2020-07-19 DIAGNOSIS — M9905 Segmental and somatic dysfunction of pelvic region: Secondary | ICD-10-CM | POA: Diagnosis not present

## 2020-07-19 DIAGNOSIS — M9902 Segmental and somatic dysfunction of thoracic region: Secondary | ICD-10-CM | POA: Diagnosis not present

## 2020-07-19 DIAGNOSIS — M48061 Spinal stenosis, lumbar region without neurogenic claudication: Secondary | ICD-10-CM | POA: Diagnosis not present

## 2020-07-19 DIAGNOSIS — M9903 Segmental and somatic dysfunction of lumbar region: Secondary | ICD-10-CM | POA: Diagnosis not present

## 2020-07-22 ENCOUNTER — Other Ambulatory Visit: Payer: Self-pay | Admitting: "Endocrinology

## 2020-07-26 DIAGNOSIS — M9905 Segmental and somatic dysfunction of pelvic region: Secondary | ICD-10-CM | POA: Diagnosis not present

## 2020-07-26 DIAGNOSIS — M9903 Segmental and somatic dysfunction of lumbar region: Secondary | ICD-10-CM | POA: Diagnosis not present

## 2020-07-26 DIAGNOSIS — M9902 Segmental and somatic dysfunction of thoracic region: Secondary | ICD-10-CM | POA: Diagnosis not present

## 2020-07-26 DIAGNOSIS — M4316 Spondylolisthesis, lumbar region: Secondary | ICD-10-CM | POA: Diagnosis not present

## 2020-08-02 DIAGNOSIS — M9903 Segmental and somatic dysfunction of lumbar region: Secondary | ICD-10-CM | POA: Diagnosis not present

## 2020-08-02 DIAGNOSIS — M4316 Spondylolisthesis, lumbar region: Secondary | ICD-10-CM | POA: Diagnosis not present

## 2020-08-02 DIAGNOSIS — M9902 Segmental and somatic dysfunction of thoracic region: Secondary | ICD-10-CM | POA: Diagnosis not present

## 2020-08-02 DIAGNOSIS — M9905 Segmental and somatic dysfunction of pelvic region: Secondary | ICD-10-CM | POA: Diagnosis not present

## 2020-08-14 ENCOUNTER — Other Ambulatory Visit: Payer: Self-pay | Admitting: "Endocrinology

## 2020-08-14 DIAGNOSIS — R7301 Impaired fasting glucose: Secondary | ICD-10-CM | POA: Diagnosis not present

## 2020-08-14 DIAGNOSIS — I10 Essential (primary) hypertension: Secondary | ICD-10-CM | POA: Diagnosis not present

## 2020-08-14 DIAGNOSIS — Z712 Person consulting for explanation of examination or test findings: Secondary | ICD-10-CM | POA: Diagnosis not present

## 2020-08-14 DIAGNOSIS — E569 Vitamin deficiency, unspecified: Secondary | ICD-10-CM | POA: Diagnosis not present

## 2020-08-14 DIAGNOSIS — E559 Vitamin D deficiency, unspecified: Secondary | ICD-10-CM | POA: Diagnosis not present

## 2020-08-14 DIAGNOSIS — Z0189 Encounter for other specified special examinations: Secondary | ICD-10-CM | POA: Diagnosis not present

## 2020-08-14 LAB — BASIC METABOLIC PANEL
BUN: 10 (ref 4–21)
CO2: 27 — AB (ref 13–22)
Chloride: 102 (ref 99–108)
Creatinine: 0.8 (ref 0.5–1.1)
Glucose: 201
Potassium: 4.7 (ref 3.4–5.3)
Sodium: 140 (ref 137–147)

## 2020-08-14 LAB — HEMOGLOBIN A1C: Hemoglobin A1C: 8.7

## 2020-08-14 LAB — HEPATIC FUNCTION PANEL
ALT: 17 (ref 7–35)
AST: 16 (ref 13–35)
Alkaline Phosphatase: 53 (ref 25–125)
Bilirubin, Total: 0.6

## 2020-08-14 LAB — VITAMIN D 25 HYDROXY (VIT D DEFICIENCY, FRACTURES): Vit D, 25-Hydroxy: 27.3

## 2020-08-14 LAB — COMPREHENSIVE METABOLIC PANEL
Albumin: 4.3 (ref 3.5–5.0)
Calcium: 9.5 (ref 8.7–10.7)
GFR calc Af Amer: 86
GFR calc non Af Amer: 74
Globulin: 2.9

## 2020-08-15 LAB — LIPID PANEL
Cholesterol: 175 (ref 0–200)
HDL: 41 (ref 35–70)
LDL Cholesterol: 111
Triglycerides: 126 (ref 40–160)

## 2020-08-16 DIAGNOSIS — M4316 Spondylolisthesis, lumbar region: Secondary | ICD-10-CM | POA: Diagnosis not present

## 2020-08-16 DIAGNOSIS — M9903 Segmental and somatic dysfunction of lumbar region: Secondary | ICD-10-CM | POA: Diagnosis not present

## 2020-08-16 DIAGNOSIS — M9902 Segmental and somatic dysfunction of thoracic region: Secondary | ICD-10-CM | POA: Diagnosis not present

## 2020-08-16 DIAGNOSIS — M9905 Segmental and somatic dysfunction of pelvic region: Secondary | ICD-10-CM | POA: Diagnosis not present

## 2020-08-21 DIAGNOSIS — E782 Mixed hyperlipidemia: Secondary | ICD-10-CM | POA: Diagnosis not present

## 2020-08-21 DIAGNOSIS — Z23 Encounter for immunization: Secondary | ICD-10-CM | POA: Diagnosis not present

## 2020-08-21 DIAGNOSIS — E559 Vitamin D deficiency, unspecified: Secondary | ICD-10-CM | POA: Diagnosis not present

## 2020-08-21 DIAGNOSIS — Z0189 Encounter for other specified special examinations: Secondary | ICD-10-CM | POA: Diagnosis not present

## 2020-08-21 DIAGNOSIS — E162 Hypoglycemia, unspecified: Secondary | ICD-10-CM | POA: Diagnosis not present

## 2020-08-21 DIAGNOSIS — Z6835 Body mass index (BMI) 35.0-35.9, adult: Secondary | ICD-10-CM | POA: Diagnosis not present

## 2020-08-21 DIAGNOSIS — I10 Essential (primary) hypertension: Secondary | ICD-10-CM | POA: Diagnosis not present

## 2020-08-21 DIAGNOSIS — M545 Low back pain, unspecified: Secondary | ICD-10-CM | POA: Diagnosis not present

## 2020-08-21 DIAGNOSIS — E6609 Other obesity due to excess calories: Secondary | ICD-10-CM | POA: Diagnosis not present

## 2020-08-21 DIAGNOSIS — E1165 Type 2 diabetes mellitus with hyperglycemia: Secondary | ICD-10-CM | POA: Diagnosis not present

## 2020-08-27 ENCOUNTER — Other Ambulatory Visit: Payer: Self-pay

## 2020-08-27 ENCOUNTER — Ambulatory Visit (INDEPENDENT_AMBULATORY_CARE_PROVIDER_SITE_OTHER): Payer: Medicare Other | Admitting: "Endocrinology

## 2020-08-27 ENCOUNTER — Encounter: Payer: Self-pay | Admitting: "Endocrinology

## 2020-08-27 VITALS — BP 142/76 | HR 68 | Ht 61.0 in | Wt 197.6 lb

## 2020-08-27 DIAGNOSIS — E782 Mixed hyperlipidemia: Secondary | ICD-10-CM

## 2020-08-27 DIAGNOSIS — E1159 Type 2 diabetes mellitus with other circulatory complications: Secondary | ICD-10-CM

## 2020-08-27 DIAGNOSIS — I10 Essential (primary) hypertension: Secondary | ICD-10-CM

## 2020-08-27 MED ORDER — GLIPIZIDE ER 5 MG PO TB24
5.0000 mg | ORAL_TABLET | Freq: Every day | ORAL | 1 refills | Status: DC
Start: 2020-08-27 — End: 2021-01-16

## 2020-08-27 NOTE — Progress Notes (Signed)
08/27/2020                 Endocrinology follow-up note   Subjective:    Patient ID: Stacy [name], female    DOB: 01/01/1956, PCP Celene Squibb, MD   Past Medical History:  Diagnosis Date   Cerebral infarction, unspecified ([place])    Chronic back pain    Diabetes mellitus    Essential (primary) hypertension    HTN (hypertension)    Hyperlipidemia    Lumbago    Obesity, unspecified    Osteoarthritis    Other hyperlipidemia    Sciatica, right side    Stroke ([place])    Sudden visual loss, left eye    Type 2 diabetes mellitus with unspecified diabetic retinopathy without macular edema (HCC)    Type 2 diabetes mellitus without complications [facility])    Past Surgical History:  Procedure Laterality Date   carpel tunnel release     bilateral   CESAREAN SECTION     COLONOSCOPY  2006   normal, Dr. Benson [place]   ESOPHAGOGASTRODUODENOSCOPY  2006   normal, Dr. Benson [place]   Social History   Socioeconomic History   Marital status: Divorced    Spouse name: Not on file   Number of children: Not on file   Years of education: Not on file   Highest education level: Not on file  Occupational History    Employer: [employer]    Comment: out due to back injury  Tobacco Use   Smoking status: Never Smoker   Smokeless tobacco: Never Used  Vaping Use   Vaping Use: Never used  Substance and Sexual Activity   Alcohol use: No    Alcohol/week: 0.0 standard drinks   Drug use: No   Sexual activity: Never  Other Topics Concern   Not on file  Social History Narrative   Not on file   Social Determinants of Health   Financial Resource Strain:    Difficulty of Paying Living Expenses: Not on file  Food Insecurity:    Worried About Charity fundraiser in the Last Year: Not on file   [employer] of Food in the Last Year: Not on file  Transportation Needs:    Lack of Transportation (Medical): Not on file   Lack of Transportation (Non-Medical): Not on file  Physical  Activity:    Days of Exercise per Week: Not on file   Minutes of Exercise per Session: Not on file  Stress:    Feeling of Stress : Not on file  Social Connections:    Frequency of Communication with Friends and Family: Not on file   Frequency of Social Gatherings with Friends and Family: Not on file   Attends Religious Services: Not on file   Active Member of Clubs or Organizations: Not on file   Attends Archivist Meetings: Not on file   Marital Status: Not on file   Outpatient Encounter Medications as of 08/27/2020  Medication Sig   Semaglutide (OZEMPIC, 0.25 OR 0.5 MG/DOSE, [place]) Inject 0.25 mg into the skin once a week.   amLODipine (NORVASC) 5 MG tablet Take 5 mg by mouth daily.   Cholecalciferol (VITAMIN D3) 125 MCG (5000 UT) CAPS Take 1 capsule (5,000 Units total) by mouth daily.   glipiZIDE (GLUCOTROL XL) 5 MG 24 hr tablet Take 1 tablet (5 mg total) by mouth daily with breakfast.   glucose blood (FREESTYLE TEST STRIPS) test strip USE TO TEST BLOOD SUGAR FOUR TIMES DAILY  AS DIRECTED.   insulin NPH-regular Human (NOVOLIN 70/30) (70-30) 100 UNIT/ML injection Inject 40 Units into the skin 2 (two) times daily with a meal.   INSULIN SYRINGE .5CC/29G 29G X 1/2" 0.5 ML MISC USE TO INJECT INSULIN TWICE DAILY   losartan-hydrochlorothiazide (HYZAAR) 100-25 MG tablet TAKE 1 TABLET BY MOUTH DAILY   rosuvastatin (CRESTOR) 10 MG tablet Take 10 mg by mouth daily.   [DISCONTINUED] glipiZIDE (GLUCOTROL XL) 2.5 MG 24 hr tablet TAKE 1 TABLET(2.5 MG) BY MOUTH DAILY WITH BREAKFAST   No facility-administered encounter medications on file as of 08/27/2020.   ALLERGIES: No Known Allergies VACCINATION STATUS: Immunization History  Administered Date(s) Administered   Influenza Whole 07/13/2007, 08/07/2009   Influenza-Unspecified 07/19/2012, 07/28/2017, 08/22/2018, 10/12/2018, 07/12/2019   PFIZER SARS-COV-2 Vaccination 01/03/2020   Pneumococcal Conjugate-13 08/22/2018    Pneumococcal Polysaccharide-23 04/22/2012, 08/24/2019   Td 01/11/2007    Diabetes She presents for her follow-up diabetic visit. She has type 2 diabetes mellitus. Onset time: She was diagnosed at approximate age of 89 years. Her disease course has been improving. There are no hypoglycemic associated symptoms. Pertinent negatives for hypoglycemia include no confusion, headaches, pallor or seizures. Associated symptoms include polydipsia and polyuria. Pertinent negatives for diabetes include no chest pain and no polyphagia. There are no hypoglycemic complications. Symptoms are improving. Diabetic complications include a CVA and nephropathy. Risk factors for coronary artery disease include diabetes mellitus, dyslipidemia, hypertension, obesity and sedentary lifestyle. Current diabetic treatment includes insulin injections. Compliance with diabetes treatment: She has history of noncompliance . Her weight is fluctuating minimally. She is following a generally unhealthy diet. When asked about meal planning, she reported none. She has not had a previous visit with a dietitian. She never (She has chronic back problem.) participates in exercise. She monitors blood glucose at home 3-4 x per day. Blood glucose monitoring compliance is adequate. Her breakfast blood glucose range is generally 140-180 mg/dl. Her dinner blood glucose range is generally 140-180 mg/dl. Her bedtime blood glucose range is generally 140-180 mg/dl. Her overall blood glucose range is 140-180 mg/dl. (She presents with slight improvement in her glycemic profile, point-of-care A1c of 8.7% improving from 9.1%.  No hypoglycemia.  ) An ACE inhibitor/angiotensin II receptor blocker is being taken.  Hyperlipidemia This is a chronic problem. The current episode started more than 1 year ago. The problem is uncontrolled. Exacerbating diseases include diabetes and obesity. Pertinent negatives include no chest pain, leg pain, myalgias or shortness of  breath. Current antihyperlipidemic treatment includes statins. Compliance problems: She had history of noncompliance.  Risk factors for coronary artery disease include dyslipidemia, diabetes mellitus, hypertension, obesity and a sedentary lifestyle.  Hypertension This is a chronic problem. The current episode started more than 1 year ago. The problem is uncontrolled. Pertinent negatives include no chest pain, headaches, palpitations or shortness of breath. Risk factors for coronary artery disease include diabetes mellitus, dyslipidemia, obesity and sedentary lifestyle. Past treatments include angiotensin blockers. Hypertensive end-organ damage includes CVA.     Review of systems  Constitutional: + Minimally fluctuating body weight,  current  Body mass index is 37.34 kg/m. , no fatigue, no subjective hyperthermia, no subjective hypothermia Eyes: no blurry vision, no xerophthalmia ENT: no sore throat, no nodules palpated in throat, no dysphagia/odynophagia, no hoarseness Cardiovascular: no Chest Pain, no Shortness of Breath, no palpitations, no leg swelling Respiratory: no cough, no shortness of breath Gastrointestinal: no Nausea/Vomiting/Diarhhea Musculoskeletal: no muscle/joint aches Skin: no rashes, no hyperemia Neurological: no tremors, no numbness, no tingling,  no dizziness Psychiatric: no depression, no anxiety    Objective:    BP (!) 142/76    Pulse 68    Ht 5\' 1"  (1.549 m)    Wt 197 lb 9.6 oz (89.6 kg)    BMI 37.34 kg/m   Wt Readings from Last 3 Encounters:  08/27/20 197 lb 9.6 oz (89.6 kg)  04/25/20 191 lb 12.8 oz (87 kg)  01/15/20 195 lb (88.5 kg)     Physical Exam- Limited  Constitutional:  Body mass index is 37.34 kg/m. , not in acute distress, normal state of mind Eyes:  EOMI, no exophthalmos Neck: Supple Thyroid: No gross goiter Respiratory: Adequate breathing efforts Musculoskeletal: no gross deformities, strength intact in all four extremities, no gross  restriction of joint movements Skin:  no rashes, no hyperemia Neurological: no tremor with outstretched hands,    Results for orders placed or performed in visit on 08/27/20  VITAMIN D 25 Hydroxy (Vit-D Deficiency, Fractures)  Result Value Ref Range   Vit D, 25-Hydroxy 57.0   Basic metabolic panel  Result Value Ref Range   Glucose 201    BUN 10 4 - 21   CO2 27 (A) 13 - 22   Creatinine 0.8 0.5 - 1.1   Potassium 4.7 3.4 - 5.3   Sodium 140 137 - 147   Chloride 102 99 - 108  Comprehensive metabolic panel  Result Value Ref Range   Globulin 2.9    GFR calc Af Amer 86    GFR calc non Af Amer 74    Calcium 9.5 8.7 - 10.7   Albumin 4.3 3.5 - 5.0  Lipid panel  Result Value Ref Range   Triglycerides 126 40 - 160   Cholesterol 175 0 - 200   HDL 41 35 - 70   LDL Cholesterol 111   Hepatic function panel  Result Value Ref Range   Alkaline Phosphatase 53 25 - 125   ALT 17 7 - 35   AST 16 13 - 35   Bilirubin, Total 0.6   Hemoglobin A1c  Result Value Ref Range   Hemoglobin A1C 8.7    Diabetic Labs (most recent): Lab Results  Component Value Date   HGBA1C 8.7 08/14/2020   HGBA1C 9.1 (A) 04/25/2020   HGBA1C 8.0 (A) 12/11/2019   Lipid Panel     Component Value Date/Time   CHOL 175 08/14/2020 0000   TRIG 126 08/14/2020 0000   HDL 41 08/14/2020 0000   CHOLHDL 4.8 09/30/2017 0915   VLDL 25 07/23/2016 0856   LDLCALC 111 08/14/2020 0000   LDLCALC 148 (H) 09/30/2017 0915   LDLDIRECT 135.5 05/02/2012 0945   CMP Latest Ref Rng & Units 08/14/2020 04/19/2020 07/31/2019  Glucose 65 - 99 mg/dL - 160(H) 191(H)  BUN 4 - 21 10 10 11   Creatinine 0.5 - 1.1 0.8 0.71 0.81  Sodium 137 - 147 140 139 141  Potassium 3.4 - 5.3 4.7 4.2 4.1  Chloride 99 - 108 102 105 107  CO2 13 - 22 27(A) 28 29  Calcium 8.7 - 10.7 9.5 9.3 9.2  Total Protein 6.1 - 8.1 g/dL - 6.8 7.0  Total Bilirubin 0.2 - 1.2 mg/dL - 0.5 0.5  Alkaline Phos 25 - 125 53 - -  AST 13 - 35 16 14 14   ALT 7 - 35 17 13 15        Assessment & Plan:   1. Uncontrolled type 2 diabetes mellitus with other circulatory complication, with long-term current  use of insulin ([place]) - her diabetes is  complicated by carotid artery stenosis and mild renal insufficiency, and history of noncompliance / non-aherence, patient remains at extremely high risk for more acute and chronic complications of diabetes which include CAD, CVA, CKD, retinopathy, and neuropathy. These are all discussed in detail with the patient.  She presents with slight improvement in her glycemic profile, point-of-care A1c of 8.7% improving from 9.1%.  No hypoglycemia.  - Her recent labs reviewed. - I have re-counseled the patient on diet management and weight loss  by adopting a carbohydrate restricted / protein rich  Diet.  - she  admits there is a room for improvement in her diet and drink choices. -  Suggestion is made for her to avoid simple carbohydrates  from her diet including Cakes, Sweet Desserts / Pastries, Ice Cream, Soda (diet and regular), Sweet Tea, Candies, Chips, Cookies, Sweet Pastries,  Store Bought Juices, Alcohol in Excess of  1-2 drinks a day, Artificial Sweeteners, Coffee Creamer, and "Sugar-free" Products. This will help patient to have stable blood glucose profile and potentially avoid unintended weight gain.  - Patient is advised to stick to a routine mealtimes to eat 3 meals  a day and avoid unnecessary snacks ( to snack only to correct hypoglycemia).  - I have approached patient with the following individualized plan to manage diabetes and patient agrees.  -She has chronically uncontrolled type 2 diabetes.  She has had a history of noncompliance /nonadherence and patient still easily disengages from self-care. -She has trouble affording co-pays for insulin analogs, however, she did reasonably well on Novolin 70/30 , and wishes to stay on this regimen for cost reasons.  -She is advised to continue Novolin 70/30 to 40 units with  breakfast and 40 units with supper  for  pre-meal blood glucose above 90 mg/dL. -She is advised to start monitoring blood glucose 4 times a day-before meals and at bedtime .  -She is tolerating glipizide. She is advised to increase glipizide to 5 mg XL p.o. daily at breakfast.  - She would have benefited from continuous glucose monitoring, however her insurance did not provide coverage for the prescription.  -She has received a sample pen of Ozempic from her PMD. She is advised to try it with 0.25 mg subcutaneously weekly. She likely will not afford this medication, given the fact that she cannot afford co-pays for insulin analogs.  -Adjustment parameters for hypo and hyperglycemia were given in a written document to patient. -Patient is encouraged to call clinic for blood glucose levels less than 70 or above 300 mg /dl. - She does not tolerate metformin.   - Patient specific target  for A1c; LDL, HDL, Triglycerides,  were discussed in detail.  2) BP/HTN:  -Her blood pressure is controlled to target.   She is advised to continue her current blood pressure medications including losartan-hydrochlorothiazide, amlodipine.    3) Lipids/HPL: She has severe dyslipidemia, LDLnot controlled to target.  She has not been taking her statins due to diffuse arthralgias as a side effect of atorvastatin.  Her insurance did not provide coverage for Repatha. She is advised to continue Crestor 10 mg p.o. nightly.   She is counseled on effects of Crestor.   4)  Weight/Diet: Her BMI 96.2 -clearly complicating her diabetes care.  She is a candidate for modest weight loss.  CDE consult has been requested, exercise, and carbohydrates information provided.  5) Chronic Care/Health Maintenance:  -Patient  is  on ACEI/ARB and  Statin medications and encouraged to continue to follow up with Ophthalmology, Podiatrist at least yearly or according to recommendations, and advised to  stay away from smoking. I have recommended  yearly flu vaccine and pneumonia vaccination at least every 5 years; moderate intensity exercise for up to 150 minutes weekly; and  sleep for at least 7 hours a day.  - I advised patient to maintain close follow up with Celene Squibb, MD for primary care needs.  - Time spent on this patient care encounter:  35 min, of which > 50% was spent in  counseling and the rest reviewing her blood glucose logs , discussing her hypoglycemia and hyperglycemia episodes, reviewing her current and  previous labs / studies  ( including abstraction from other facilities) and medications  doses and developing a  long term treatment plan and documenting her care.   Please refer to Patient Instructions for Blood Glucose Monitoring and Insulin/Medications Dosing Guide"  in media tab for additional information. Please  also refer to " Patient Self Inventory" in the Media  tab for reviewed elements of pertinent patient history.  Stacy [name] participated in the discussions, expressed understanding, and voiced agreement with the above plans.  All questions were answered to her satisfaction. she is encouraged to contact clinic should she have any questions or concerns prior to her return visit.   Follow up plan: -Return in about 4 months (around 12/25/2020) for Bring Meter and Logs- A1c in Office, ABI in Office [place], Urine MA - [place].  Glade Lloyd, MD Phone: [phone]  Fax: [phone]  This note was partially dictated with voice recognition software. Similar sounding words can be transcribed inadequately or may not  be corrected upon review.  08/27/2020, 4:38 PM

## 2020-08-27 NOTE — Patient Instructions (Signed)

## 2020-09-13 DIAGNOSIS — M9903 Segmental and somatic dysfunction of lumbar region: Secondary | ICD-10-CM | POA: Diagnosis not present

## 2020-09-13 DIAGNOSIS — M9905 Segmental and somatic dysfunction of pelvic region: Secondary | ICD-10-CM | POA: Diagnosis not present

## 2020-09-13 DIAGNOSIS — M9902 Segmental and somatic dysfunction of thoracic region: Secondary | ICD-10-CM | POA: Diagnosis not present

## 2020-09-13 DIAGNOSIS — M4316 Spondylolisthesis, lumbar region: Secondary | ICD-10-CM | POA: Diagnosis not present

## 2020-09-20 DIAGNOSIS — E1165 Type 2 diabetes mellitus with hyperglycemia: Secondary | ICD-10-CM | POA: Diagnosis not present

## 2020-09-20 DIAGNOSIS — E162 Hypoglycemia, unspecified: Secondary | ICD-10-CM | POA: Diagnosis not present

## 2020-10-17 IMAGING — MG DIGITAL SCREENING BILAT W/ TOMO W/ CAD
6 of 10 series · 6 of 30 positions shown · non-contrast
Comparison: Previous exam(s).

CLINICAL DATA: Screening.

EXAM:
DIGITAL SCREENING BILATERAL MAMMOGRAM WITH TOMO AND CAD

[R CC synth-2D (1 of 2)]
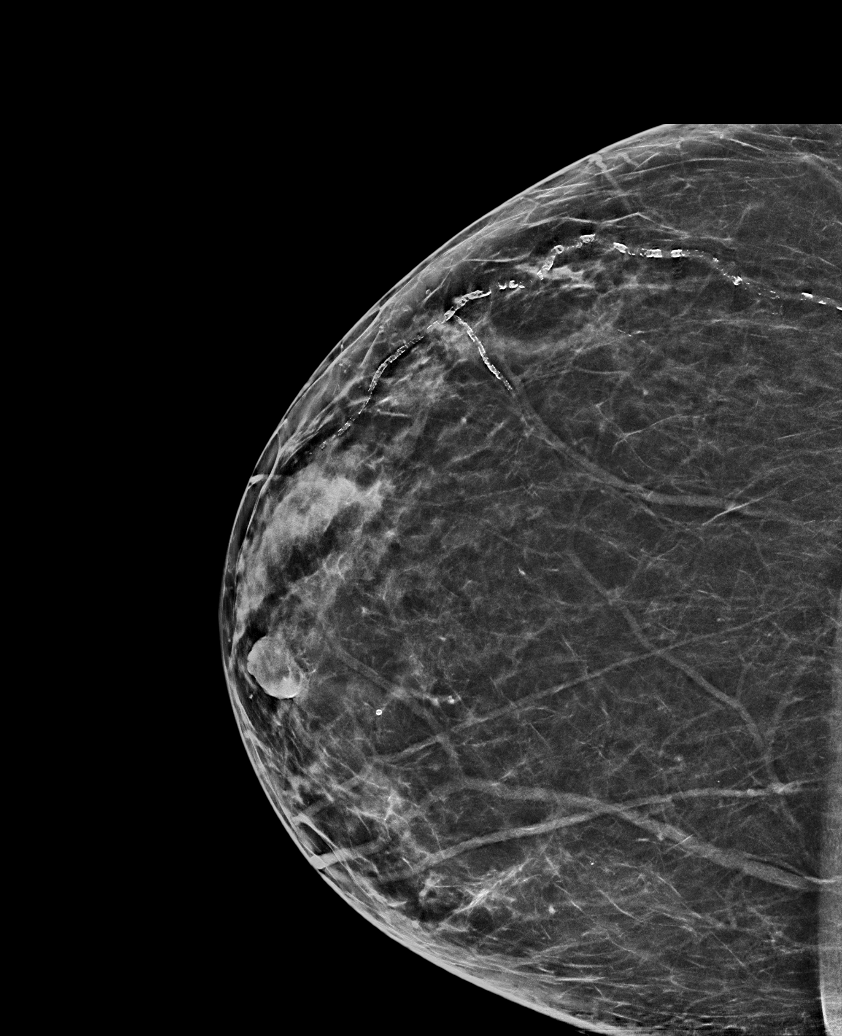

[L MLO synth-2D]
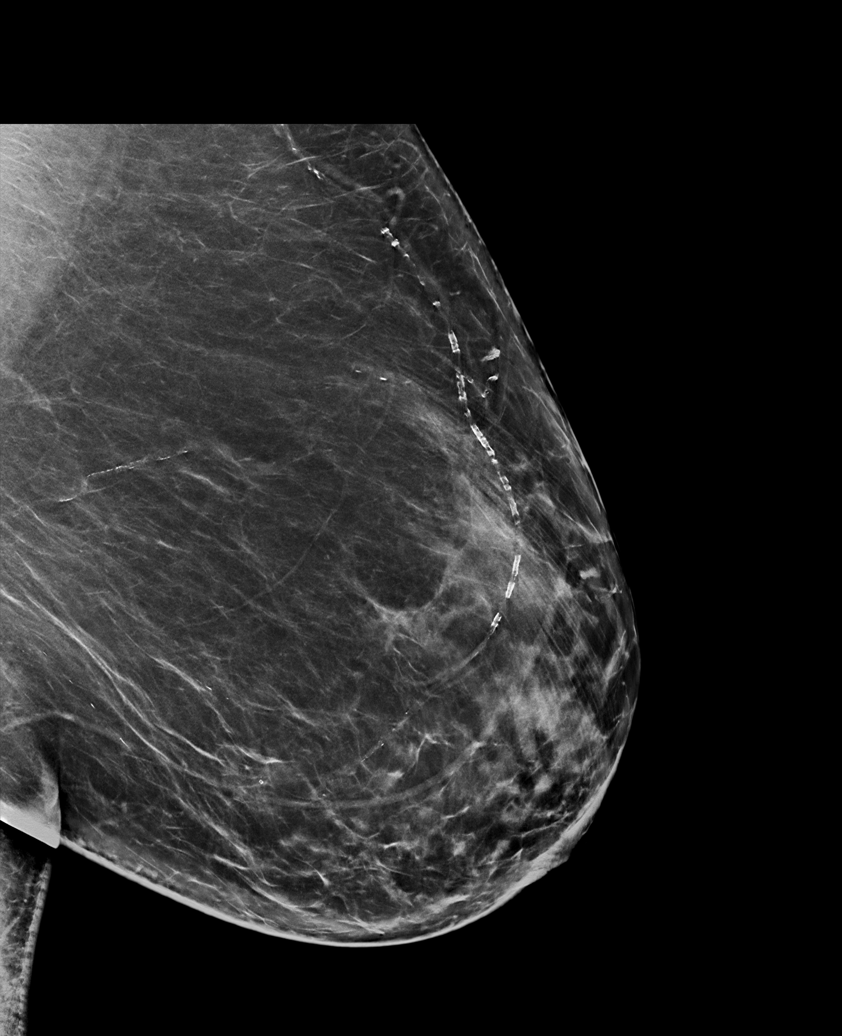

[L CC synth-2D]
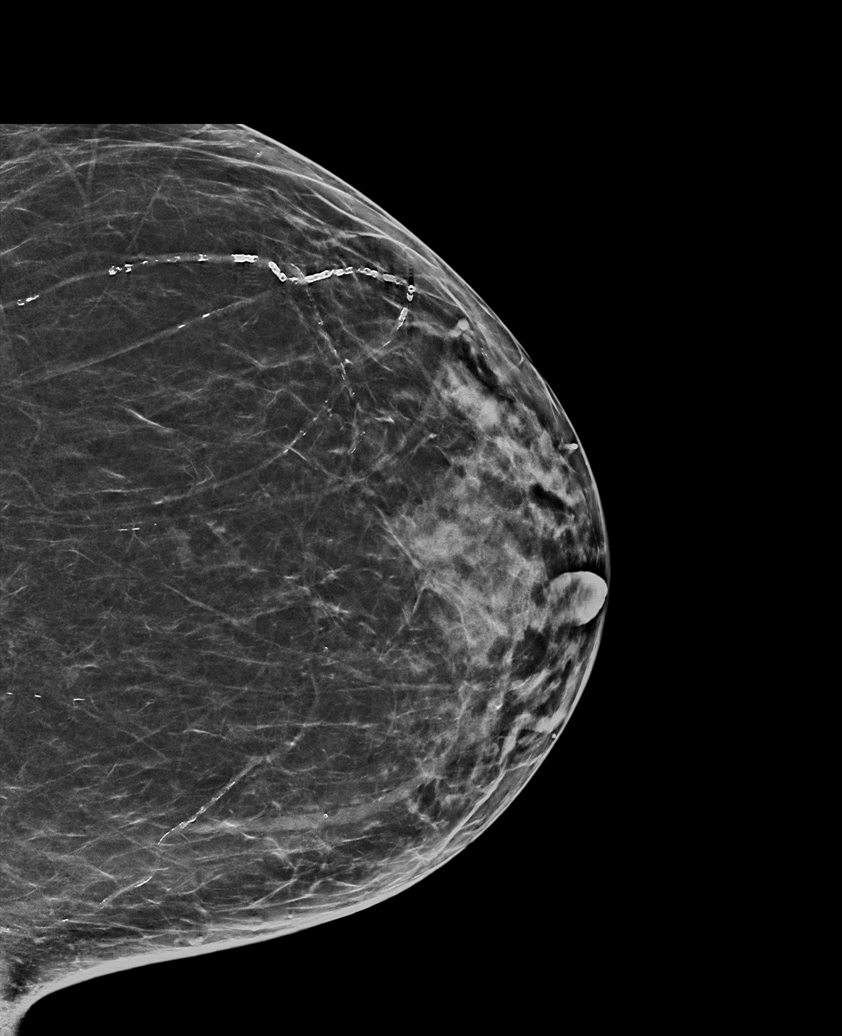

[R CC synth-2D (2 of 2)]
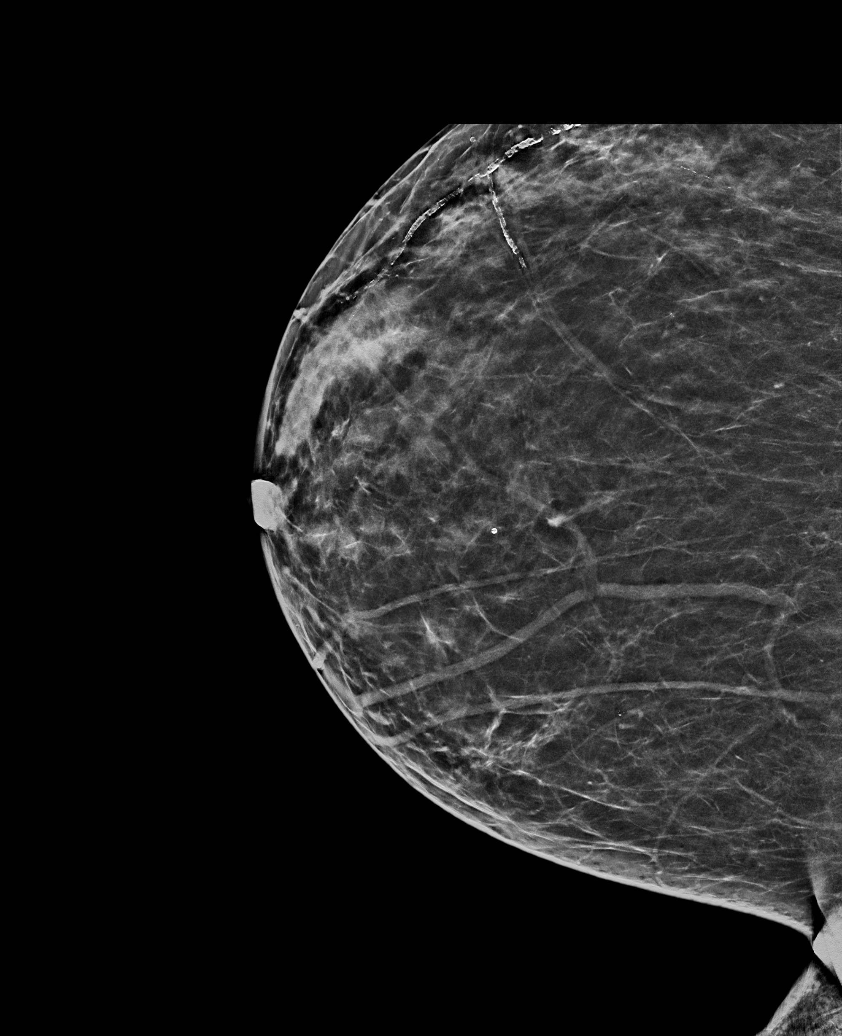

[R MLO synth-2D]
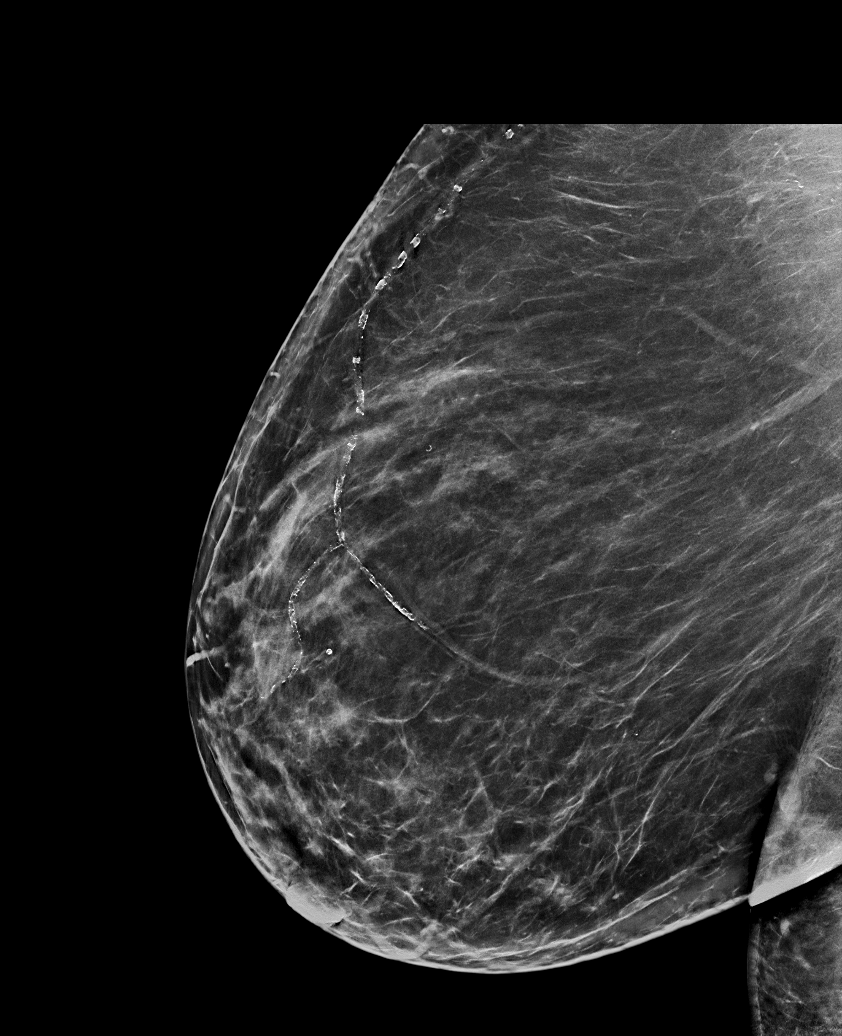

[R CC tomo · tomo slice 38/75.0]
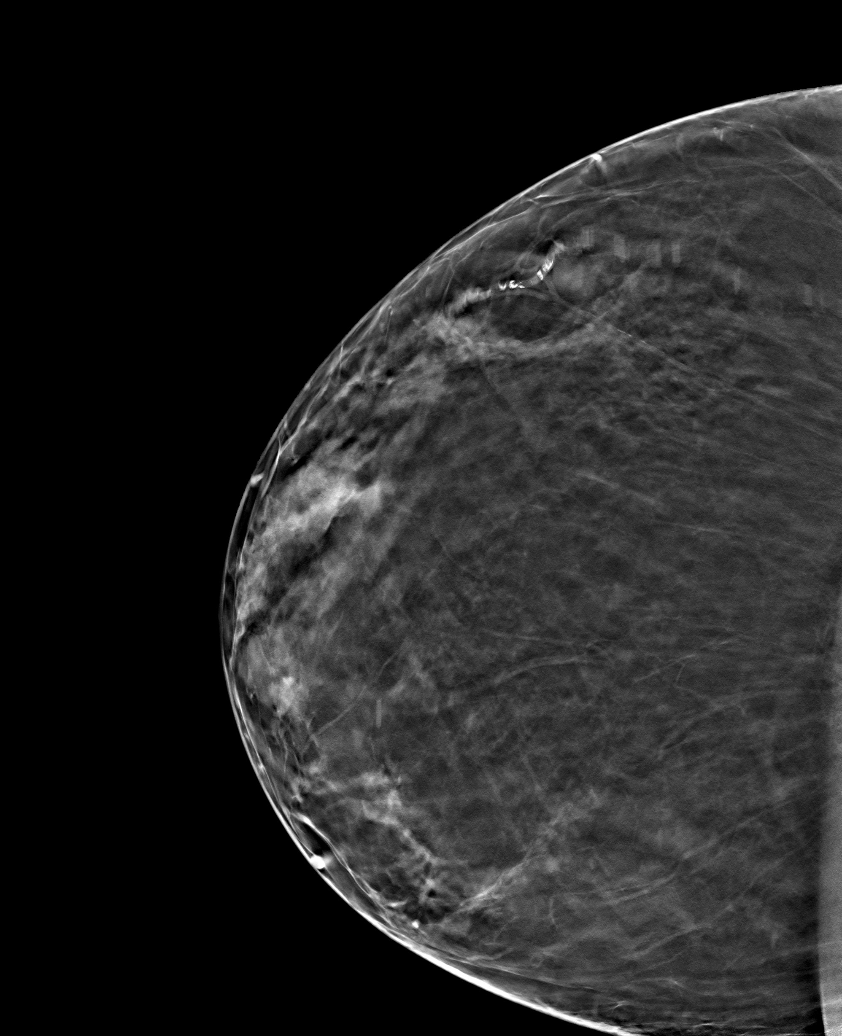

[6 of 30 positions shown; findings below may reference images not displayed]

ACR Breast Density Category b: There are scattered areas of
fibroglandular density.
FINDINGS: There are no findings suspicious for malignancy. Images were
processed with CAD.
IMPRESSION: No mammographic evidence of malignancy. A result letter of this
screening mammogram will be mailed directly to the patient.

RECOMMENDATION:
Screening mammogram in one year. (Code:[id number])

BI-RADS CATEGORY  1: Negative.

## 2020-10-18 DIAGNOSIS — M9905 Segmental and somatic dysfunction of pelvic region: Secondary | ICD-10-CM | POA: Diagnosis not present

## 2020-10-18 DIAGNOSIS — M9903 Segmental and somatic dysfunction of lumbar region: Secondary | ICD-10-CM | POA: Diagnosis not present

## 2020-10-18 DIAGNOSIS — M9902 Segmental and somatic dysfunction of thoracic region: Secondary | ICD-10-CM | POA: Diagnosis not present

## 2020-10-18 DIAGNOSIS — M4316 Spondylolisthesis, lumbar region: Secondary | ICD-10-CM | POA: Diagnosis not present

## 2020-11-15 ENCOUNTER — Other Ambulatory Visit: Payer: Self-pay | Admitting: "Endocrinology

## 2020-11-15 DIAGNOSIS — M9903 Segmental and somatic dysfunction of lumbar region: Secondary | ICD-10-CM | POA: Diagnosis not present

## 2020-11-15 DIAGNOSIS — M4316 Spondylolisthesis, lumbar region: Secondary | ICD-10-CM | POA: Diagnosis not present

## 2020-11-15 DIAGNOSIS — M9905 Segmental and somatic dysfunction of pelvic region: Secondary | ICD-10-CM | POA: Diagnosis not present

## 2020-11-15 DIAGNOSIS — M9902 Segmental and somatic dysfunction of thoracic region: Secondary | ICD-10-CM | POA: Diagnosis not present

## 2020-11-16 ENCOUNTER — Other Ambulatory Visit: Payer: Self-pay | Admitting: "Endocrinology

## 2020-11-28 DIAGNOSIS — E113593 Type 2 diabetes mellitus with proliferative diabetic retinopathy without macular edema, bilateral: Secondary | ICD-10-CM | POA: Diagnosis not present

## 2020-11-28 DIAGNOSIS — H31093 Other chorioretinal scars, bilateral: Secondary | ICD-10-CM | POA: Diagnosis not present

## 2020-11-28 DIAGNOSIS — H35033 Hypertensive retinopathy, bilateral: Secondary | ICD-10-CM | POA: Diagnosis not present

## 2020-12-13 DIAGNOSIS — M9903 Segmental and somatic dysfunction of lumbar region: Secondary | ICD-10-CM | POA: Diagnosis not present

## 2020-12-13 DIAGNOSIS — M9905 Segmental and somatic dysfunction of pelvic region: Secondary | ICD-10-CM | POA: Diagnosis not present

## 2020-12-13 DIAGNOSIS — M4316 Spondylolisthesis, lumbar region: Secondary | ICD-10-CM | POA: Diagnosis not present

## 2020-12-13 DIAGNOSIS — M9902 Segmental and somatic dysfunction of thoracic region: Secondary | ICD-10-CM | POA: Diagnosis not present

## 2020-12-25 ENCOUNTER — Ambulatory Visit: Payer: Medicare Other | Admitting: "Endocrinology

## 2021-01-02 DIAGNOSIS — Z712 Person consulting for explanation of examination or test findings: Secondary | ICD-10-CM | POA: Diagnosis not present

## 2021-01-02 DIAGNOSIS — E1165 Type 2 diabetes mellitus with hyperglycemia: Secondary | ICD-10-CM | POA: Diagnosis not present

## 2021-01-02 DIAGNOSIS — Z0189 Encounter for other specified special examinations: Secondary | ICD-10-CM | POA: Diagnosis not present

## 2021-01-02 DIAGNOSIS — Z6835 Body mass index (BMI) 35.0-35.9, adult: Secondary | ICD-10-CM | POA: Diagnosis not present

## 2021-01-02 DIAGNOSIS — E559 Vitamin D deficiency, unspecified: Secondary | ICD-10-CM | POA: Diagnosis not present

## 2021-01-02 DIAGNOSIS — I10 Essential (primary) hypertension: Secondary | ICD-10-CM | POA: Diagnosis not present

## 2021-01-02 DIAGNOSIS — E782 Mixed hyperlipidemia: Secondary | ICD-10-CM | POA: Diagnosis not present

## 2021-01-02 DIAGNOSIS — E6609 Other obesity due to excess calories: Secondary | ICD-10-CM | POA: Diagnosis not present

## 2021-01-02 DIAGNOSIS — M545 Low back pain, unspecified: Secondary | ICD-10-CM | POA: Diagnosis not present

## 2021-01-02 DIAGNOSIS — E162 Hypoglycemia, unspecified: Secondary | ICD-10-CM | POA: Diagnosis not present

## 2021-01-02 DIAGNOSIS — Z23 Encounter for immunization: Secondary | ICD-10-CM | POA: Diagnosis not present

## 2021-01-02 LAB — LIPID PANEL
Cholesterol: 156 (ref 0–200)
HDL: 39 (ref 35–70)
LDL Cholesterol: 94
Triglycerides: 127 (ref 40–160)

## 2021-01-02 LAB — BASIC METABOLIC PANEL
BUN: 10 (ref 4–21)
Creatinine: 0.8 (ref 0.5–1.1)

## 2021-01-02 LAB — HEMOGLOBIN A1C: Hemoglobin A1C: 7.9

## 2021-01-08 DIAGNOSIS — M545 Low back pain, unspecified: Secondary | ICD-10-CM | POA: Diagnosis not present

## 2021-01-08 DIAGNOSIS — E782 Mixed hyperlipidemia: Secondary | ICD-10-CM | POA: Diagnosis not present

## 2021-01-08 DIAGNOSIS — K5900 Constipation, unspecified: Secondary | ICD-10-CM | POA: Diagnosis not present

## 2021-01-08 DIAGNOSIS — E559 Vitamin D deficiency, unspecified: Secondary | ICD-10-CM | POA: Diagnosis not present

## 2021-01-08 DIAGNOSIS — I10 Essential (primary) hypertension: Secondary | ICD-10-CM | POA: Diagnosis not present

## 2021-01-08 DIAGNOSIS — Z6835 Body mass index (BMI) 35.0-35.9, adult: Secondary | ICD-10-CM | POA: Diagnosis not present

## 2021-01-08 DIAGNOSIS — E6609 Other obesity due to excess calories: Secondary | ICD-10-CM | POA: Diagnosis not present

## 2021-01-08 DIAGNOSIS — E162 Hypoglycemia, unspecified: Secondary | ICD-10-CM | POA: Diagnosis not present

## 2021-01-08 DIAGNOSIS — E1165 Type 2 diabetes mellitus with hyperglycemia: Secondary | ICD-10-CM | POA: Diagnosis not present

## 2021-01-09 ENCOUNTER — Other Ambulatory Visit (HOSPITAL_COMMUNITY): Payer: Self-pay | Admitting: Internal Medicine

## 2021-01-09 DIAGNOSIS — Z13820 Encounter for screening for osteoporosis: Secondary | ICD-10-CM

## 2021-01-10 DIAGNOSIS — M9903 Segmental and somatic dysfunction of lumbar region: Secondary | ICD-10-CM | POA: Diagnosis not present

## 2021-01-10 DIAGNOSIS — M9905 Segmental and somatic dysfunction of pelvic region: Secondary | ICD-10-CM | POA: Diagnosis not present

## 2021-01-10 DIAGNOSIS — M9902 Segmental and somatic dysfunction of thoracic region: Secondary | ICD-10-CM | POA: Diagnosis not present

## 2021-01-10 DIAGNOSIS — M4316 Spondylolisthesis, lumbar region: Secondary | ICD-10-CM | POA: Diagnosis not present

## 2021-01-16 ENCOUNTER — Ambulatory Visit (INDEPENDENT_AMBULATORY_CARE_PROVIDER_SITE_OTHER): Payer: Medicare Other | Admitting: "Endocrinology

## 2021-01-16 ENCOUNTER — Encounter: Payer: Self-pay | Admitting: "Endocrinology

## 2021-01-16 VITALS — BP 178/82 | HR 76 | Ht 61.0 in | Wt 191.0 lb

## 2021-01-16 DIAGNOSIS — E782 Mixed hyperlipidemia: Secondary | ICD-10-CM | POA: Diagnosis not present

## 2021-01-16 DIAGNOSIS — I739 Peripheral vascular disease, unspecified: Secondary | ICD-10-CM

## 2021-01-16 DIAGNOSIS — I10 Essential (primary) hypertension: Secondary | ICD-10-CM | POA: Diagnosis not present

## 2021-01-16 DIAGNOSIS — E1159 Type 2 diabetes mellitus with other circulatory complications: Secondary | ICD-10-CM

## 2021-01-16 LAB — POCT UA - MICROALBUMIN
Albumin/Creatinine Ratio, Urine, POC: <30
Creatinine, POC: 200 mg/dL
Microalbumin Ur, POC: 10 mg/L

## 2021-01-16 MED ORDER — NOVOLIN 70/30 (70-30) 100 UNIT/ML ~~LOC~~ SUSP: 40.0000 [IU] | Freq: Two times a day (BID) | SUBCUTANEOUS | 0 refills | Status: DC

## 2021-01-16 NOTE — Progress Notes (Signed)
01/16/2021                 Endocrinology follow-up note   Subjective:    Patient ID: Stacy [name], female    DOB: 07-10-1956, PCP Celene Squibb, MD   Past Medical History:  Diagnosis Date  . Cerebral infarction, unspecified ([place])   . Chronic back pain   . Diabetes mellitus   . Essential (primary) hypertension   . HTN (hypertension)   . Hyperlipidemia   . Lumbago   . Obesity, unspecified   . Osteoarthritis   . Other hyperlipidemia   . Sciatica, right side   . Stroke ([place])   . Sudden visual loss, left eye   . Type 2 diabetes mellitus with unspecified diabetic retinopathy without macular edema (HCC)   . Type 2 diabetes mellitus without complications [facility])    Past Surgical History:  Procedure Laterality Date  . carpel tunnel release     bilateral  . CESAREAN SECTION    . COLONOSCOPY  2006   normal, Dr. Benson [place]  . ESOPHAGOGASTRODUODENOSCOPY  2006   normal, Dr. Benson [place]   Social History   Socioeconomic History  . Marital status: Divorced    Spouse name: Not on file  . Number of children: Not on file  . Years of education: Not on file  . Highest education level: Not on file  Occupational History    Employer: [employer]    Comment: out due to back injury  Tobacco Use  . Smoking status: Never Smoker  . Smokeless tobacco: Never Used  Vaping Use  . Vaping Use: Never used  Substance and Sexual Activity  . Alcohol use: No    Alcohol/week: 0.0 standard drinks  . Drug use: No  . Sexual activity: Never  Other Topics Concern  . Not on file  Social History Narrative  . Not on file   Social Determinants of Health   Financial Resource Strain: Not on file  Food Insecurity: Not on file  Transportation Needs: Not on file  Physical Activity: Not on file  Stress: Not on file  Social Connections: Not on file   Outpatient Encounter Medications as of 01/16/2021  Medication Sig  . amLODipine (NORVASC) 5 MG tablet Take 5 mg by mouth daily.  . Cholecalciferol (VITAMIN D3) 125  MCG (5000 UT) CAPS Take 1 capsule (5,000 Units total) by mouth daily.  Marland Kitchen glucose blood (FREESTYLE TEST STRIPS) test strip USE TO TEST BLOOD SUGAR FOUR TIMES DAILY AS DIRECTED.  Marland Kitchen insulin NPH-regular Human (NOVOLIN 70/30) (70-30) 100 UNIT/ML injection Inject 40 Units into the skin 2 (two) times daily with a meal.  . Insulin Syringe-Needle U-100 (INSULIN SYRINGE .5CC/31GX5/16") 31G X 5/16" 0.5 ML MISC USE TO INJECT INSULIN TWICE DAILY  . losartan-hydrochlorothiazide (HYZAAR) 100-25 MG tablet TAKE 1 TABLET BY MOUTH DAILY  . rosuvastatin (CRESTOR) 10 MG tablet Take 10 mg by mouth daily.  . Semaglutide (OZEMPIC, 0.25 OR 0.5 MG/DOSE, [place]) Inject 0.5 mg into the skin once a week.  . [DISCONTINUED] glipiZIDE (GLUCOTROL XL) 5 MG 24 hr tablet Take 1 tablet (5 mg total) by mouth daily with breakfast. (Patient not taking: Reported on 01/16/2021)  . [DISCONTINUED] insulin NPH-regular Human (NOVOLIN 70/30) (70-30) 100 UNIT/ML injection Inject 40 Units into the skin 2 (two) times daily with a meal.   No facility-administered encounter medications on file as of 01/16/2021.   ALLERGIES: No Known Allergies VACCINATION STATUS: Immunization History  Administered Date(s) Administered  . Influenza Whole 07/13/2007,  08/07/2009  . Influenza-Unspecified 07/19/2012, 07/28/2017, 08/22/2018, 10/12/2018, 07/12/2019  . PFIZER(Purple Top)SARS-COV-2 Vaccination 01/03/2020  . Pneumococcal Conjugate-13 08/22/2018  . Pneumococcal Polysaccharide-23 04/22/2012, 08/24/2019  . Td 01/11/2007    Diabetes She presents for her follow-up diabetic visit. She has type 2 diabetes mellitus. Onset time: She was diagnosed at approximate age of 43 years. Her disease course has been improving. There are no hypoglycemic associated symptoms. Pertinent negatives for hypoglycemia include no confusion, headaches, pallor or seizures. Associated symptoms include polydipsia and polyuria. Pertinent negatives for diabetes include no chest pain and no  polyphagia. There are no hypoglycemic complications. Symptoms are improving. Diabetic complications include a CVA and nephropathy. Risk factors for coronary artery disease include diabetes mellitus, dyslipidemia, hypertension, obesity and sedentary lifestyle. Current diabetic treatment includes insulin injections. Compliance with diabetes treatment: She has history of noncompliance . Her weight is fluctuating minimally. She is following a generally unhealthy diet. When asked about meal planning, she reported none. She has not had a previous visit with a dietitian. She never (She has chronic back problem.) participates in exercise. She monitors blood glucose at home 3-4 x per day. Blood glucose monitoring compliance is adequate. Her home blood glucose trend is decreasing steadily. Her breakfast blood glucose range is generally 140-180 mg/dl. Her dinner blood glucose range is generally 140-180 mg/dl. Her bedtime blood glucose range is generally 140-180 mg/dl. Her overall blood glucose range is 140-180 mg/dl. (She presents with significant improvement in her glycemic profile.  Her previsit labs show A1c of 7.9% improving from 8.7%.  She did not document hypoglycemia.  She continues to tolerate Ozempic, did not continue glipizide.   ) An ACE inhibitor/angiotensin II receptor blocker is being taken.  Hyperlipidemia This is a chronic problem. The current episode started more than 1 year ago. The problem is uncontrolled. Exacerbating diseases include diabetes and obesity. Pertinent negatives include no chest pain, leg pain, myalgias or shortness of breath. Current antihyperlipidemic treatment includes statins. Compliance problems: She had history of noncompliance.  Risk factors for coronary artery disease include dyslipidemia, diabetes mellitus, hypertension, obesity and a sedentary lifestyle.  Hypertension This is a chronic problem. The current episode started more than 1 year ago. The problem is uncontrolled.  Pertinent negatives include no chest pain, headaches, palpitations or shortness of breath. Risk factors for coronary artery disease include diabetes mellitus, dyslipidemia, obesity and sedentary lifestyle. Past treatments include angiotensin blockers. Hypertensive end-organ damage includes CVA.     Review of systems  Constitutional: + Minimally fluctuating body weight,  current  Body mass index is 36.09 kg/m. , no fatigue, no subjective hyperthermia, no subjective hypothermia   Objective:    BP (!) 178/82   Pulse 76   Ht 5\' 1"  (1.549 m)   Wt 191 lb (86.6 kg)   BMI 36.09 kg/m   Wt Readings from Last 3 Encounters:  01/16/21 191 lb (86.6 kg)  08/27/20 197 lb 9.6 oz (89.6 kg)  04/25/20 191 lb 12.8 oz (87 kg)     Physical Exam- Limited  Constitutional:  Body mass index is 36.09 kg/m. , not in acute distress, normal state of mind   Results for orders placed or performed in visit on 25/05/39  Basic metabolic panel  Result Value Ref Range   BUN 10 4 - 21   Creatinine 0.8 0.5 - 1.1  Lipid panel  Result Value Ref Range   Triglycerides 127 40 - 160   Cholesterol 156 0 - 200   HDL 39 35 - 70  LDL Cholesterol 94   Hemoglobin A1c  Result Value Ref Range   Hemoglobin A1C 7.9   POCT UA - Microalbumin  Result Value Ref Range   Microalbumin Ur, POC 10 mg/L   Creatinine, POC 200 mg/dL   Albumin/Creatinine Ratio, Urine, POC <30    Diabetic Labs (most recent): Lab Results  Component Value Date   HGBA1C 7.9 01/02/2021   HGBA1C 8.7 08/14/2020   HGBA1C 9.1 (A) 04/25/2020   Lipid Panel     Component Value Date/Time   CHOL 156 01/02/2021 0000   TRIG 127 01/02/2021 0000   HDL 39 01/02/2021 0000   CHOLHDL 4.8 09/30/2017 0915   VLDL 25 07/23/2016 0856   LDLCALC 94 01/02/2021 0000   LDLCALC 148 (H) 09/30/2017 0915   LDLDIRECT 135.5 05/02/2012 0945   CMP Latest Ref Rng & Units 01/02/2021 08/14/2020 04/19/2020  Glucose 65 - 99 mg/dL - - 160(H)  BUN 4 - 21 10 10 10   Creatinine 0.5  - 1.1 0.8 0.8 0.71  Sodium 137 - 147 - 140 139  Potassium 3.4 - 5.3 - 4.7 4.2  Chloride 99 - 108 - 102 105  CO2 13 - 22 - 27(A) 28  Calcium 8.7 - 10.7 - 9.5 9.3  Total Protein 6.1 - 8.1 g/dL - - 6.8  Total Bilirubin 0.2 - 1.2 mg/dL - - 0.5  Alkaline Phos 25 - 125 - 53 -  AST 13 - 35 - 16 14  ALT 7 - 35 - 17 13      Assessment & Plan:   1. Uncontrolled type 2 diabetes mellitus with other circulatory complication, with long-term current use of insulin ([place]) - her diabetes is  complicated by carotid artery stenosis and mild renal insufficiency, and history of noncompliance / non-aherence, patient remains at extremely high risk for more acute and chronic complications of diabetes which include CAD, CVA, CKD, retinopathy, and neuropathy. These are all discussed in detail with the patient.  She presents with significant improvement in her glycemic profile.  Her previsit labs show A1c of 7.9% improving from 8.7%.  She did not document hypoglycemia.  She continues to tolerate Ozempic, did not continue glipizide.   - Her recent labs reviewed. - I have re-counseled the patient on diet management and weight loss  by adopting a carbohydrate restricted / protein rich  Diet.  - she acknowledges that there is a room for improvement in her food and drink choices. - Suggestion is made for her to avoid simple carbohydrates  from her diet including Cakes, Sweet Desserts, Ice Cream, Soda (diet and regular), Sweet Tea, Candies, Chips, Cookies, Store Bought Juices, Alcohol in Excess of  1-2 drinks a day, Artificial Sweeteners,  Coffee Creamer, and "Sugar-free" Products, Lemonade. This will help patient to have more stable blood glucose profile and potentially avoid unintended weight gain.   - Patient is advised to stick to a routine mealtimes to eat 3 meals  a day and avoid unnecessary snacks ( to snack only to correct hypoglycemia).  - I have approached patient with the following individualized plan to  manage diabetes and patient agrees.  -She has chronically uncontrolled type 2 diabetes.  She has had a history of noncompliance /nonadherence and patient still easily disengages from self-care. -She has trouble affording co-pays for insulin analogs, however, she did reasonably well on Novolin 70/30 , and wishes to stay on this regimen for cost reasons.  -She is advised to continue Novolin 70/30 40 units with breakfast and  40 units with supper  for  pre-meal blood glucose above 90 mg/dL. -She is advised to start monitoring blood glucose 4 times a day-before meals and at bedtime .  -She did not want to continue glipizide.  She is tolerating Ozempic, advised to increase to 0.5 mg subcutaneously weekly.  - She would have benefited from continuous glucose monitoring, however her insurance did not provide coverage for the prescription.    -Adjustment parameters for hypo and hyperglycemia were given in a written document to patient. -Patient is encouraged to call clinic for blood glucose levels less than 70 or above 300 mg /dl. - She does not tolerate metformin.   - Patient specific target  for A1c; LDL, HDL, Triglycerides,  were discussed in detail.  2) BP/HTN:  -Her blood pressure is not controlled to target.   She is advised to continue her current blood pressure medications including losartan-hydrochlorothiazide, amlodipine.    3) Lipids/HPL: She has severe dyslipidemia, LDLnot controlled to target.  She has not been taking her statins due to diffuse arthralgias as a side effect of atorvastatin.  Her insurance did not provide coverage for Repatha. She is advised to continue Crestor 10 mg p.o. nightly.  Her LDL is better at 94.    4)  Weight/Diet: Her BMI 34.19  -clearly complicating her diabetes care.  She is a candidate for modest weight loss.  CDE consult has been requested, exercise, and carbohydrates information provided.  5) Chronic Care/Health Maintenance:  -Patient  is  on ACEI/ARB  and Statin medications and encouraged to continue to follow up with Ophthalmology, Podiatrist at least yearly or according to recommendations, and advised to  stay away from smoking. I have recommended yearly flu vaccine and pneumonia vaccination at least every 5 years; moderate intensity exercise for up to 150 minutes weekly; and  sleep for at least 7 hours a day.  6) peripheral arterial disease POCT ABI Results 01/16/21  Her screening ABI is abnormal today. Right ABI: 1.07      left ABI: PAD  Right leg systolic / diastolic: 622/29 mmHg Left leg systolic / diastolic: PAD mmHg  Arm systolic / diastolic: 798/92 mmHG She will be referred to vascular surgery for better evaluation.  - I advised patient to maintain close follow up with Celene Squibb, MD for primary care needs.  - Time spent on this patient care encounter:  35 min, of which > 50% was spent in  counseling and the rest reviewing her blood glucose logs , discussing her hypoglycemia and hyperglycemia episodes, reviewing her current and  previous labs / studies  ( including abstraction from other facilities) and medications  doses and developing a  long term treatment plan and documenting her care.   Please refer to Patient Instructions for Blood Glucose Monitoring and Insulin/Medications Dosing Guide"  in media tab for additional information. Please  also refer to " Patient Self Inventory" in the Media  tab for reviewed elements of pertinent patient history.  Stacy [name] participated in the discussions, expressed understanding, and voiced agreement with the above plans.  All questions were answered to her satisfaction. she is encouraged to contact clinic should she have any questions or concerns prior to her return visit.  Follow up plan: -Return in about 4 months (around 05/18/2021) for Bring Meter and Logs- A1c in Office.  Glade Lloyd, MD Phone: [phone]  Fax: [phone]  This note was partially dictated with voice recognition  software. Similar sounding words can be transcribed inadequately  or may not  be corrected upon review.  01/16/2021, 2:48 PM

## 2021-01-16 NOTE — Patient Instructions (Signed)
                                     Advice for Weight Management  -For most of us the best way to lose weight is by diet management. Generally speaking, diet management means consuming less calories intentionally which over time brings about progressive weight loss.  This can be achieved more effectively by restricting carbohydrate consumption to the minimum possible.  So, it is critically important to know your numbers: how much calorie you are consuming and how much calorie you need. More importantly, our carbohydrates sources should be unprocessed or minimally processed complex starch food items.   Sometimes, it is important to balance nutrition by increasing protein intake (animal or plant source), fruits, and vegetables.  -Sticking to a routine mealtime to eat 3 meals a day and avoiding unnecessary snacks is shown to have a big role in weight control. Under normal circumstances, the only time we lose real weight is when we are hungry, so allow hunger to take place- hunger means no food between meal times, only water.  It is not advisable to starve.   -It is better to avoid simple carbohydrates including: Cakes, Sweet Desserts, Ice Cream, Soda (diet and regular), Sweet Tea, Candies, Chips, Cookies, Store Bought Juices, Alcohol in Excess of  1-2 drinks a day, Lemonade,  Artificial Sweeteners, Doughnuts, Coffee Creamers, "Sugar-free" Products, etc, etc.  This is not a complete list.....    -Consulting with certified diabetes educators is proven to provide you with the most accurate and current information on diet.  Also, you may be  interested in discussing diet options/exchanges , we can schedule a visit with Stacy [name], RDN, CDE for individualized nutrition education.  -Exercise: If you are able: 30 -60 minutes a day ,4 days a week, or 150 minutes a week.  The longer the better.  Combine stretch, strength, and aerobic activities.  If you were told in the  past that you have high risk for cardiovascular diseases, you may seek evaluation by your heart doctor prior to initiating moderate to intense exercise programs.                                  Additional Care Considerations for Diabetes   -Diabetes  is a chronic disease.  The most important care consideration is regular follow-up with your diabetes care provider with the goal being avoiding or delaying its complications and to take advantage of advances in medications and technology.    -Type 2 diabetes is known to coexist with other important comorbidities such as high blood pressure and high cholesterol.  It is critical to control not only the diabetes but also the high blood pressure and high cholesterol to minimize and delay the risk of complications including coronary artery disease, stroke, amputations, blindness, etc.    - Studies showed that people with diabetes will benefit from a class of medications known as ACE inhibitors and statins.  Unless there are specific reasons not to be on these medications, the standard of care is to consider getting one from these groups of medications at an optimal doses.  These medications are generally considered safe and proven to help protect the heart and the kidneys.    - People with diabetes are encouraged to initiate and maintain regular follow-up with eye doctors, foot   doctors, dentists , and if necessary heart and kidney doctors.     - It is highly recommended that people with diabetes quit smoking or stay away from smoking, and get yearly  flu vaccine and pneumonia vaccine at least every 5 years.  One other important lifestyle recommendation is to ensure adequate sleep - at least 6-7 hours of uninterrupted sleep at night.  -Exercise: If you are able: 30 -60 minutes a day, 4 days a week, or 150 minutes a week.  The longer the better.  Combine stretch, strength, and aerobic activities.  If you were told in the past that you have high risk for  cardiovascular diseases, you may seek evaluation by your heart doctor prior to initiating moderate to intense exercise programs.          

## 2021-01-17 ENCOUNTER — Ambulatory Visit (HOSPITAL_COMMUNITY)
Admission: RE | Admit: 2021-01-17 | Discharge: 2021-01-17 | Disposition: A | Discharge status: Home / Self Care | Payer: Medicare Other | Source: Ambulatory Visit | Attending: Internal Medicine | Admitting: Internal Medicine

## 2021-01-17 ENCOUNTER — Other Ambulatory Visit: Payer: Self-pay

## 2021-01-17 DIAGNOSIS — Z794 Long term (current) use of insulin: Secondary | ICD-10-CM | POA: Insufficient documentation

## 2021-01-17 DIAGNOSIS — Z13820 Encounter for screening for osteoporosis: Secondary | ICD-10-CM | POA: Insufficient documentation

## 2021-01-17 DIAGNOSIS — Z780 Asymptomatic menopausal state: Secondary | ICD-10-CM | POA: Diagnosis not present

## 2021-01-17 DIAGNOSIS — E119 Type 2 diabetes mellitus without complications: Secondary | ICD-10-CM | POA: Insufficient documentation

## 2021-02-03 ENCOUNTER — Other Ambulatory Visit: Payer: Self-pay

## 2021-02-03 DIAGNOSIS — R6889 Other general symptoms and signs: Secondary | ICD-10-CM

## 2021-02-12 ENCOUNTER — Other Ambulatory Visit: Payer: Self-pay

## 2021-02-12 ENCOUNTER — Ambulatory Visit (INDEPENDENT_AMBULATORY_CARE_PROVIDER_SITE_OTHER): Payer: Medicare Other | Admitting: Vascular Surgery

## 2021-02-12 ENCOUNTER — Encounter: Payer: Self-pay | Admitting: Vascular Surgery

## 2021-02-12 ENCOUNTER — Ambulatory Visit (HOSPITAL_COMMUNITY)
Admission: RE | Admit: 2021-02-12 | Discharge: 2021-02-12 | Disposition: A | Discharge status: Home / Self Care | Payer: Medicare Other | Source: Ambulatory Visit | Attending: Vascular Surgery | Admitting: Vascular Surgery

## 2021-02-12 VITALS — BP 126/74 | HR 80 | Temp 98.3°F | Resp 16 | Ht 61.0 in | Wt 185.0 lb

## 2021-02-12 DIAGNOSIS — R6889 Other general symptoms and signs: Secondary | ICD-10-CM | POA: Diagnosis not present

## 2021-02-12 DIAGNOSIS — I739 Peripheral vascular disease, unspecified: Secondary | ICD-10-CM | POA: Diagnosis not present

## 2021-02-12 NOTE — Progress Notes (Signed)
REASON FOR CONSULT:    To evaluate for peripheral vascular disease.  The consult is requested by Dr. Allyn Kenner.  ASSESSMENT & PLAN:   PERIPHERAL VASCULAR DISEASE: Based on her exam she does have evidence of infrainguinal arterial occlusive disease bilaterally.  However currently she is essentially asymptomatic.  She has no history of claudication, rest pain, or nonhealing ulcers.  For this reason I would not recommend an aggressive work-up at this point.  We have discussed the importance of exercise specifically walking.  We also discussed the importance of nutrition.  I favor largely a plant-based diet.  She is on a statin but was not on aspirin and I have recommended that she begin taking 81 mg of aspirin daily.  Fortunately she is not a smoker.  Given her history of diabetes I think it would be reasonable to establish some routine follow-up and I have ordered ABIs in 1 year and I will see her back at that time.  She knows to call sooner if she has problems.  She understands if she developed disabling claudication, rest pain, or nonhealing ulcers and certainly we need to consider arteriography and a more aggressive work-up.  Stacy Mayo, MD Office: [phone]   HPI:   Stacy [name] is a pleasant 65 y.o. female, who was referred for evaluation of peripheral vascular disease.  She has a history of diabetes and had diminished pedal pulses.  On my history, the patient denies any history of claudication although she does admit that with the COVID situation her activity has been fairly limited.  She denies any history of rest pain or nonhealing ulcers.  Risk factors for peripheral vascular disease include type 2 diabetes, hypertension, hypercholesterolemia, and a family history of premature cardiovascular disease.  Her father had heart disease in his 64s.  She is not a smoker.  She is on a statin but does not take aspirin.  Past Medical History:  Diagnosis Date  . Cerebral infarction,  unspecified ([place])   . Chronic back pain   . Diabetes mellitus   . Essential (primary) hypertension   . HTN (hypertension)   . Hyperlipidemia   . Lumbago   . Obesity, unspecified   . Osteoarthritis   . Other hyperlipidemia   . Sciatica, right side   . Stroke ([place])   . Sudden visual loss, left eye   . Type 2 diabetes mellitus with unspecified diabetic retinopathy without macular edema (HCC)   . Type 2 diabetes mellitus without complications (HCC)     Family History  Problem Relation Age of Onset  . Diabetes Mother   . Heart disease Mother   . Hyperlipidemia Mother   . Hypertension Mother   . Heart attack Mother   . Diabetes Father   . Heart disease Father   . Hyperlipidemia Father   . Hypertension Father   . Heart attack Father   . Heart disease Brother   . Liver disease Neg Hx   . Colon cancer Neg Hx   . GI problems Neg Hx   . Stroke Neg Hx     SOCIAL HISTORY: Social History   Socioeconomic History  . Marital status: Divorced    Spouse name: Not on file  . Number of children: Not on file  . Years of education: Not on file  . Highest education level: Not on file  Occupational History    Employer: [employer]    Comment: out due to back injury  Tobacco Use  .  Smoking status: Never Smoker  . Smokeless tobacco: Never Used  Vaping Use  . Vaping Use: Never used  Substance and Sexual Activity  . Alcohol use: No    Alcohol/week: 0.0 standard drinks  . Drug use: No  . Sexual activity: Never  Other Topics Concern  . Not on file  Social History Narrative  . Not on file   Social Determinants of Health   Financial Resource Strain: Not on file  Food Insecurity: Not on file  Transportation Needs: Not on file  Physical Activity: Not on file  Stress: Not on file  Social Connections: Not on file  Intimate Partner Violence: Not on file    No Known Allergies  Current Outpatient Medications  Medication Sig Dispense Refill  . amLODipine (NORVASC) 5 MG tablet  Take 5 mg by mouth daily.    Marland Kitchen Bioflavonoid Products (BIOFLEX PO) Take by mouth.    . Cholecalciferol (VITAMIN D3) 125 MCG (5000 UT) CAPS Take 1 capsule (5,000 Units total) by mouth daily. 90 capsule 1  . cyanocobalamin 1000 MCG tablet Take 3,000 mcg by mouth daily.    Marland Kitchen glucose blood (FREESTYLE TEST STRIPS) test strip USE TO TEST BLOOD SUGAR FOUR TIMES DAILY AS DIRECTED. 100 each 2  . insulin NPH-regular Human (NOVOLIN 70/30) (70-30) 100 UNIT/ML injection Inject 40 Units into the skin 2 (two) times daily with a meal. 80 mL 0  . Insulin Syringe-Needle U-100 (INSULIN SYRINGE .5CC/31GX5/16") 31G X 5/16" 0.5 ML MISC USE TO INJECT INSULIN TWICE DAILY 100 each 2  . losartan-hydrochlorothiazide (HYZAAR) 100-25 MG tablet TAKE 1 TABLET BY MOUTH DAILY 90 tablet 0  . rosuvastatin (CRESTOR) 10 MG tablet Take 10 mg by mouth daily.    . Semaglutide (OZEMPIC, 0.25 OR 0.5 MG/DOSE, [place]in) Inject 0.5 mg into the skin once a week.    . vitamin C (ASCORBIC ACID) 500 MG tablet Take 500 mg by mouth daily.    . vitamin E (VITAMIN E) 180 MG (400 UNITS) capsule Take 400 Units by mouth daily.     No current facility-administered medications for this visit.    REVIEW OF SYSTEMS:  [X]  denotes positive finding, [ ]  denotes negative finding Cardiac  Comments:  Chest pain or chest pressure:    Shortness of breath upon exertion:    Short of breath when lying flat:    Irregular heart rhythm:        Vascular    Pain in calf, thigh, or hip brought on by ambulation:    Pain in feet at night that wakes you up from your sleep:     Blood clot in your veins:    Leg swelling:         Pulmonary    Oxygen at home:    Productive cough:     Wheezing:         Neurologic    Sudden weakness in arms or legs:     Sudden numbness in arms or legs:     Sudden onset of difficulty speaking or slurred speech:    Temporary loss of vision in one eye:     Problems with dizziness:         Gastrointestinal    Blood in stool:      Vomited blood:         Genitourinary    Burning when urinating:     Blood in urine:        Psychiatric    Major depression:  Hematologic    Bleeding problems:    Problems with blood clotting too easily:        Skin    Rashes or ulcers:        Constitutional    Fever or chills:     PHYSICAL EXAM:   Vitals:   02/12/21 1530  BP: 126/74  Pulse: 80  Resp: 16  Temp: 98.3 F (36.8 C)  SpO2: 97%  Weight: 185 lb (83.9 kg)  Height: 5\' 1"  (1.549 m)   Body mass index is 34.96 kg/m.  GENERAL: The patient is a well-nourished female, in no acute distress. The vital signs are documented above. CARDIAC: There is a regular rate and rhythm.  VASCULAR: I do not detect carotid bruits. On the right side, she has a slightly diminished femoral pulse.  I cannot palpate popliteal or pedal pulses. On the left side she has a palpable femoral pulse.  I cannot palpate popliteal or pedal pulses. PULMONARY: There is good air exchange bilaterally without wheezing or rales. ABDOMEN: Soft and non-tender with normal pitched bowel sounds.  I do not palpate an abdominal aortic aneurysm. MUSCULOSKELETAL: There are no major deformities or cyanosis. NEUROLOGIC: No focal weakness or paresthesias are detected. SKIN: There are no ulcers or rashes noted. PSYCHIATRIC: The patient has a normal affect.  DATA:    ARTERIAL DOPPLER STUDY: I have independently interpreted her arterial Doppler study today.  On the right side there is a biphasic posterior tibial signal and a monophasic dorsalis pedis signal.  ABI is 98%.  Toe pressure 71 mmHg.  On the left side, there is a monophasic dorsalis pedis and posterior tibial signal.  ABIs 71% toe pressure could not be obtained.  LABS: I have reviewed the labs from 01/02/2021.  Creatinine was 0.8.  LDL cholesterol 94.  Total cholesterol 156.

## 2021-02-14 DIAGNOSIS — M4316 Spondylolisthesis, lumbar region: Secondary | ICD-10-CM | POA: Diagnosis not present

## 2021-02-14 DIAGNOSIS — M9903 Segmental and somatic dysfunction of lumbar region: Secondary | ICD-10-CM | POA: Diagnosis not present

## 2021-02-14 DIAGNOSIS — M9902 Segmental and somatic dysfunction of thoracic region: Secondary | ICD-10-CM | POA: Diagnosis not present

## 2021-02-14 DIAGNOSIS — M9905 Segmental and somatic dysfunction of pelvic region: Secondary | ICD-10-CM | POA: Diagnosis not present

## 2021-03-14 DIAGNOSIS — M9903 Segmental and somatic dysfunction of lumbar region: Secondary | ICD-10-CM | POA: Diagnosis not present

## 2021-03-14 DIAGNOSIS — M4316 Spondylolisthesis, lumbar region: Secondary | ICD-10-CM | POA: Diagnosis not present

## 2021-03-14 DIAGNOSIS — M9902 Segmental and somatic dysfunction of thoracic region: Secondary | ICD-10-CM | POA: Diagnosis not present

## 2021-03-14 DIAGNOSIS — M9905 Segmental and somatic dysfunction of pelvic region: Secondary | ICD-10-CM | POA: Diagnosis not present

## 2021-04-11 DIAGNOSIS — M9902 Segmental and somatic dysfunction of thoracic region: Secondary | ICD-10-CM | POA: Diagnosis not present

## 2021-04-11 DIAGNOSIS — M9903 Segmental and somatic dysfunction of lumbar region: Secondary | ICD-10-CM | POA: Diagnosis not present

## 2021-04-11 DIAGNOSIS — M4316 Spondylolisthesis, lumbar region: Secondary | ICD-10-CM | POA: Diagnosis not present

## 2021-04-11 DIAGNOSIS — M9905 Segmental and somatic dysfunction of pelvic region: Secondary | ICD-10-CM | POA: Diagnosis not present

## 2021-04-14 DIAGNOSIS — Z20822 Contact with and (suspected) exposure to covid-19: Secondary | ICD-10-CM | POA: Diagnosis not present

## 2021-05-09 DIAGNOSIS — M4316 Spondylolisthesis, lumbar region: Secondary | ICD-10-CM | POA: Diagnosis not present

## 2021-05-09 DIAGNOSIS — M9903 Segmental and somatic dysfunction of lumbar region: Secondary | ICD-10-CM | POA: Diagnosis not present

## 2021-05-09 DIAGNOSIS — M9905 Segmental and somatic dysfunction of pelvic region: Secondary | ICD-10-CM | POA: Diagnosis not present

## 2021-05-09 DIAGNOSIS — M9902 Segmental and somatic dysfunction of thoracic region: Secondary | ICD-10-CM | POA: Diagnosis not present

## 2021-05-21 DIAGNOSIS — H3582 Retinal ischemia: Secondary | ICD-10-CM | POA: Diagnosis not present

## 2021-05-21 DIAGNOSIS — H2513 Age-related nuclear cataract, bilateral: Secondary | ICD-10-CM | POA: Diagnosis not present

## 2021-05-21 DIAGNOSIS — Z9889 Other specified postprocedural states: Secondary | ICD-10-CM | POA: Diagnosis not present

## 2021-05-21 DIAGNOSIS — Z794 Long term (current) use of insulin: Secondary | ICD-10-CM | POA: Diagnosis not present

## 2021-05-21 DIAGNOSIS — E113553 Type 2 diabetes mellitus with stable proliferative diabetic retinopathy, bilateral: Secondary | ICD-10-CM | POA: Diagnosis not present

## 2021-05-26 ENCOUNTER — Ambulatory Visit: Payer: Medicare Other | Admitting: "Endocrinology

## 2021-05-29 DIAGNOSIS — H35033 Hypertensive retinopathy, bilateral: Secondary | ICD-10-CM | POA: Diagnosis not present

## 2021-05-29 DIAGNOSIS — H43823 Vitreomacular adhesion, bilateral: Secondary | ICD-10-CM | POA: Diagnosis not present

## 2021-05-29 DIAGNOSIS — H43812 Vitreous degeneration, left eye: Secondary | ICD-10-CM | POA: Diagnosis not present

## 2021-05-29 DIAGNOSIS — E113593 Type 2 diabetes mellitus with proliferative diabetic retinopathy without macular edema, bilateral: Secondary | ICD-10-CM | POA: Diagnosis not present

## 2021-06-09 DIAGNOSIS — M4316 Spondylolisthesis, lumbar region: Secondary | ICD-10-CM | POA: Diagnosis not present

## 2021-06-09 DIAGNOSIS — M9902 Segmental and somatic dysfunction of thoracic region: Secondary | ICD-10-CM | POA: Diagnosis not present

## 2021-06-09 DIAGNOSIS — M9903 Segmental and somatic dysfunction of lumbar region: Secondary | ICD-10-CM | POA: Diagnosis not present

## 2021-06-09 DIAGNOSIS — M9905 Segmental and somatic dysfunction of pelvic region: Secondary | ICD-10-CM | POA: Diagnosis not present

## 2021-06-11 ENCOUNTER — Other Ambulatory Visit: Payer: Self-pay | Admitting: "Endocrinology

## 2021-06-11 DIAGNOSIS — E1159 Type 2 diabetes mellitus with other circulatory complications: Secondary | ICD-10-CM

## 2021-06-20 DIAGNOSIS — M9905 Segmental and somatic dysfunction of pelvic region: Secondary | ICD-10-CM | POA: Diagnosis not present

## 2021-06-20 DIAGNOSIS — M9902 Segmental and somatic dysfunction of thoracic region: Secondary | ICD-10-CM | POA: Diagnosis not present

## 2021-06-20 DIAGNOSIS — M4316 Spondylolisthesis, lumbar region: Secondary | ICD-10-CM | POA: Diagnosis not present

## 2021-06-20 DIAGNOSIS — M9903 Segmental and somatic dysfunction of lumbar region: Secondary | ICD-10-CM | POA: Diagnosis not present

## 2021-06-23 DIAGNOSIS — M4316 Spondylolisthesis, lumbar region: Secondary | ICD-10-CM | POA: Diagnosis not present

## 2021-06-23 DIAGNOSIS — M9905 Segmental and somatic dysfunction of pelvic region: Secondary | ICD-10-CM | POA: Diagnosis not present

## 2021-06-23 DIAGNOSIS — M9903 Segmental and somatic dysfunction of lumbar region: Secondary | ICD-10-CM | POA: Diagnosis not present

## 2021-06-23 DIAGNOSIS — M9902 Segmental and somatic dysfunction of thoracic region: Secondary | ICD-10-CM | POA: Diagnosis not present

## 2021-06-25 DIAGNOSIS — M4316 Spondylolisthesis, lumbar region: Secondary | ICD-10-CM | POA: Diagnosis not present

## 2021-06-25 DIAGNOSIS — M9903 Segmental and somatic dysfunction of lumbar region: Secondary | ICD-10-CM | POA: Diagnosis not present

## 2021-06-25 DIAGNOSIS — M9905 Segmental and somatic dysfunction of pelvic region: Secondary | ICD-10-CM | POA: Diagnosis not present

## 2021-06-25 DIAGNOSIS — M9902 Segmental and somatic dysfunction of thoracic region: Secondary | ICD-10-CM | POA: Diagnosis not present

## 2021-06-30 DIAGNOSIS — M4316 Spondylolisthesis, lumbar region: Secondary | ICD-10-CM | POA: Diagnosis not present

## 2021-06-30 DIAGNOSIS — M9902 Segmental and somatic dysfunction of thoracic region: Secondary | ICD-10-CM | POA: Diagnosis not present

## 2021-06-30 DIAGNOSIS — M9905 Segmental and somatic dysfunction of pelvic region: Secondary | ICD-10-CM | POA: Diagnosis not present

## 2021-06-30 DIAGNOSIS — M9903 Segmental and somatic dysfunction of lumbar region: Secondary | ICD-10-CM | POA: Diagnosis not present

## 2021-07-02 DIAGNOSIS — M9902 Segmental and somatic dysfunction of thoracic region: Secondary | ICD-10-CM | POA: Diagnosis not present

## 2021-07-02 DIAGNOSIS — M9903 Segmental and somatic dysfunction of lumbar region: Secondary | ICD-10-CM | POA: Diagnosis not present

## 2021-07-02 DIAGNOSIS — M4316 Spondylolisthesis, lumbar region: Secondary | ICD-10-CM | POA: Diagnosis not present

## 2021-07-02 DIAGNOSIS — M9905 Segmental and somatic dysfunction of pelvic region: Secondary | ICD-10-CM | POA: Diagnosis not present

## 2021-07-07 DIAGNOSIS — M9903 Segmental and somatic dysfunction of lumbar region: Secondary | ICD-10-CM | POA: Diagnosis not present

## 2021-07-07 DIAGNOSIS — M9905 Segmental and somatic dysfunction of pelvic region: Secondary | ICD-10-CM | POA: Diagnosis not present

## 2021-07-07 DIAGNOSIS — M9902 Segmental and somatic dysfunction of thoracic region: Secondary | ICD-10-CM | POA: Diagnosis not present

## 2021-07-07 DIAGNOSIS — M4316 Spondylolisthesis, lumbar region: Secondary | ICD-10-CM | POA: Diagnosis not present

## 2021-07-07 DIAGNOSIS — M546 Pain in thoracic spine: Secondary | ICD-10-CM | POA: Diagnosis not present

## 2021-07-08 DIAGNOSIS — E1165 Type 2 diabetes mellitus with hyperglycemia: Secondary | ICD-10-CM | POA: Diagnosis not present

## 2021-07-08 DIAGNOSIS — I10 Essential (primary) hypertension: Secondary | ICD-10-CM | POA: Diagnosis not present

## 2021-07-14 DIAGNOSIS — M9903 Segmental and somatic dysfunction of lumbar region: Secondary | ICD-10-CM | POA: Diagnosis not present

## 2021-07-14 DIAGNOSIS — M546 Pain in thoracic spine: Secondary | ICD-10-CM | POA: Diagnosis not present

## 2021-07-14 DIAGNOSIS — M9902 Segmental and somatic dysfunction of thoracic region: Secondary | ICD-10-CM | POA: Diagnosis not present

## 2021-07-14 DIAGNOSIS — M4316 Spondylolisthesis, lumbar region: Secondary | ICD-10-CM | POA: Diagnosis not present

## 2021-07-14 DIAGNOSIS — M9905 Segmental and somatic dysfunction of pelvic region: Secondary | ICD-10-CM | POA: Diagnosis not present

## 2021-07-15 DIAGNOSIS — K5900 Constipation, unspecified: Secondary | ICD-10-CM | POA: Diagnosis not present

## 2021-07-15 DIAGNOSIS — E559 Vitamin D deficiency, unspecified: Secondary | ICD-10-CM | POA: Diagnosis not present

## 2021-07-15 DIAGNOSIS — Z6835 Body mass index (BMI) 35.0-35.9, adult: Secondary | ICD-10-CM | POA: Diagnosis not present

## 2021-07-15 DIAGNOSIS — E1165 Type 2 diabetes mellitus with hyperglycemia: Secondary | ICD-10-CM | POA: Diagnosis not present

## 2021-07-15 DIAGNOSIS — I10 Essential (primary) hypertension: Secondary | ICD-10-CM | POA: Diagnosis not present

## 2021-07-15 DIAGNOSIS — I739 Peripheral vascular disease, unspecified: Secondary | ICD-10-CM | POA: Diagnosis not present

## 2021-07-15 DIAGNOSIS — E6609 Other obesity due to excess calories: Secondary | ICD-10-CM | POA: Diagnosis not present

## 2021-07-15 DIAGNOSIS — E782 Mixed hyperlipidemia: Secondary | ICD-10-CM | POA: Diagnosis not present

## 2021-07-15 DIAGNOSIS — Z23 Encounter for immunization: Secondary | ICD-10-CM | POA: Diagnosis not present

## 2021-07-15 DIAGNOSIS — M545 Low back pain, unspecified: Secondary | ICD-10-CM | POA: Diagnosis not present

## 2021-07-22 DIAGNOSIS — R059 Cough, unspecified: Secondary | ICD-10-CM | POA: Diagnosis not present

## 2021-07-22 DIAGNOSIS — R0981 Nasal congestion: Secondary | ICD-10-CM | POA: Diagnosis not present

## 2021-07-22 DIAGNOSIS — R067 Sneezing: Secondary | ICD-10-CM | POA: Diagnosis not present

## 2021-07-22 DIAGNOSIS — U071 COVID-19: Secondary | ICD-10-CM | POA: Diagnosis not present

## 2021-07-23 ENCOUNTER — Ambulatory Visit: Payer: Medicare Other | Admitting: "Endocrinology

## 2021-08-01 DIAGNOSIS — M9905 Segmental and somatic dysfunction of pelvic region: Secondary | ICD-10-CM | POA: Diagnosis not present

## 2021-08-01 DIAGNOSIS — M546 Pain in thoracic spine: Secondary | ICD-10-CM | POA: Diagnosis not present

## 2021-08-01 DIAGNOSIS — M9902 Segmental and somatic dysfunction of thoracic region: Secondary | ICD-10-CM | POA: Diagnosis not present

## 2021-08-01 DIAGNOSIS — M9903 Segmental and somatic dysfunction of lumbar region: Secondary | ICD-10-CM | POA: Diagnosis not present

## 2021-08-01 DIAGNOSIS — M4316 Spondylolisthesis, lumbar region: Secondary | ICD-10-CM | POA: Diagnosis not present

## 2021-08-04 DIAGNOSIS — M9903 Segmental and somatic dysfunction of lumbar region: Secondary | ICD-10-CM | POA: Diagnosis not present

## 2021-08-04 DIAGNOSIS — M9905 Segmental and somatic dysfunction of pelvic region: Secondary | ICD-10-CM | POA: Diagnosis not present

## 2021-08-04 DIAGNOSIS — M4316 Spondylolisthesis, lumbar region: Secondary | ICD-10-CM | POA: Diagnosis not present

## 2021-08-04 DIAGNOSIS — M9902 Segmental and somatic dysfunction of thoracic region: Secondary | ICD-10-CM | POA: Diagnosis not present

## 2021-08-04 DIAGNOSIS — M546 Pain in thoracic spine: Secondary | ICD-10-CM | POA: Diagnosis not present

## 2021-08-11 DIAGNOSIS — M9902 Segmental and somatic dysfunction of thoracic region: Secondary | ICD-10-CM | POA: Diagnosis not present

## 2021-08-11 DIAGNOSIS — M9903 Segmental and somatic dysfunction of lumbar region: Secondary | ICD-10-CM | POA: Diagnosis not present

## 2021-08-11 DIAGNOSIS — M9905 Segmental and somatic dysfunction of pelvic region: Secondary | ICD-10-CM | POA: Diagnosis not present

## 2021-08-11 DIAGNOSIS — M4316 Spondylolisthesis, lumbar region: Secondary | ICD-10-CM | POA: Diagnosis not present

## 2021-08-11 DIAGNOSIS — M546 Pain in thoracic spine: Secondary | ICD-10-CM | POA: Diagnosis not present

## 2021-08-18 DIAGNOSIS — M546 Pain in thoracic spine: Secondary | ICD-10-CM | POA: Diagnosis not present

## 2021-08-18 DIAGNOSIS — S39012A Strain of muscle, fascia and tendon of lower back, initial encounter: Secondary | ICD-10-CM | POA: Diagnosis not present

## 2021-08-18 DIAGNOSIS — M9903 Segmental and somatic dysfunction of lumbar region: Secondary | ICD-10-CM | POA: Diagnosis not present

## 2021-08-18 DIAGNOSIS — M4316 Spondylolisthesis, lumbar region: Secondary | ICD-10-CM | POA: Diagnosis not present

## 2021-08-18 DIAGNOSIS — M9905 Segmental and somatic dysfunction of pelvic region: Secondary | ICD-10-CM | POA: Diagnosis not present

## 2021-08-18 DIAGNOSIS — M9902 Segmental and somatic dysfunction of thoracic region: Secondary | ICD-10-CM | POA: Diagnosis not present

## 2021-08-19 DIAGNOSIS — Z20828 Contact with and (suspected) exposure to other viral communicable diseases: Secondary | ICD-10-CM | POA: Diagnosis not present

## 2021-08-22 DIAGNOSIS — M9905 Segmental and somatic dysfunction of pelvic region: Secondary | ICD-10-CM | POA: Diagnosis not present

## 2021-08-22 DIAGNOSIS — M9902 Segmental and somatic dysfunction of thoracic region: Secondary | ICD-10-CM | POA: Diagnosis not present

## 2021-08-22 DIAGNOSIS — S39012A Strain of muscle, fascia and tendon of lower back, initial encounter: Secondary | ICD-10-CM | POA: Diagnosis not present

## 2021-08-22 DIAGNOSIS — M4316 Spondylolisthesis, lumbar region: Secondary | ICD-10-CM | POA: Diagnosis not present

## 2021-08-22 DIAGNOSIS — M9903 Segmental and somatic dysfunction of lumbar region: Secondary | ICD-10-CM | POA: Diagnosis not present

## 2021-08-22 DIAGNOSIS — M546 Pain in thoracic spine: Secondary | ICD-10-CM | POA: Diagnosis not present

## 2021-08-25 DIAGNOSIS — S39012A Strain of muscle, fascia and tendon of lower back, initial encounter: Secondary | ICD-10-CM | POA: Diagnosis not present

## 2021-08-25 DIAGNOSIS — M546 Pain in thoracic spine: Secondary | ICD-10-CM | POA: Diagnosis not present

## 2021-08-25 DIAGNOSIS — M9903 Segmental and somatic dysfunction of lumbar region: Secondary | ICD-10-CM | POA: Diagnosis not present

## 2021-08-25 DIAGNOSIS — M9902 Segmental and somatic dysfunction of thoracic region: Secondary | ICD-10-CM | POA: Diagnosis not present

## 2021-08-25 DIAGNOSIS — M4316 Spondylolisthesis, lumbar region: Secondary | ICD-10-CM | POA: Diagnosis not present

## 2021-08-25 DIAGNOSIS — M9905 Segmental and somatic dysfunction of pelvic region: Secondary | ICD-10-CM | POA: Diagnosis not present

## 2021-08-29 DIAGNOSIS — M4316 Spondylolisthesis, lumbar region: Secondary | ICD-10-CM | POA: Diagnosis not present

## 2021-08-29 DIAGNOSIS — M9902 Segmental and somatic dysfunction of thoracic region: Secondary | ICD-10-CM | POA: Diagnosis not present

## 2021-08-29 DIAGNOSIS — S39012A Strain of muscle, fascia and tendon of lower back, initial encounter: Secondary | ICD-10-CM | POA: Diagnosis not present

## 2021-08-29 DIAGNOSIS — M546 Pain in thoracic spine: Secondary | ICD-10-CM | POA: Diagnosis not present

## 2021-08-29 DIAGNOSIS — M9903 Segmental and somatic dysfunction of lumbar region: Secondary | ICD-10-CM | POA: Diagnosis not present

## 2021-08-29 DIAGNOSIS — M9905 Segmental and somatic dysfunction of pelvic region: Secondary | ICD-10-CM | POA: Diagnosis not present

## 2021-09-01 DIAGNOSIS — M9903 Segmental and somatic dysfunction of lumbar region: Secondary | ICD-10-CM | POA: Diagnosis not present

## 2021-09-01 DIAGNOSIS — M9905 Segmental and somatic dysfunction of pelvic region: Secondary | ICD-10-CM | POA: Diagnosis not present

## 2021-09-01 DIAGNOSIS — M9902 Segmental and somatic dysfunction of thoracic region: Secondary | ICD-10-CM | POA: Diagnosis not present

## 2021-09-01 DIAGNOSIS — M546 Pain in thoracic spine: Secondary | ICD-10-CM | POA: Diagnosis not present

## 2021-09-01 DIAGNOSIS — M4316 Spondylolisthesis, lumbar region: Secondary | ICD-10-CM | POA: Diagnosis not present

## 2021-09-01 DIAGNOSIS — S39012A Strain of muscle, fascia and tendon of lower back, initial encounter: Secondary | ICD-10-CM | POA: Diagnosis not present

## 2021-09-02 DIAGNOSIS — M9902 Segmental and somatic dysfunction of thoracic region: Secondary | ICD-10-CM | POA: Diagnosis not present

## 2021-09-02 DIAGNOSIS — M9905 Segmental and somatic dysfunction of pelvic region: Secondary | ICD-10-CM | POA: Diagnosis not present

## 2021-09-02 DIAGNOSIS — M546 Pain in thoracic spine: Secondary | ICD-10-CM | POA: Diagnosis not present

## 2021-09-02 DIAGNOSIS — S39012A Strain of muscle, fascia and tendon of lower back, initial encounter: Secondary | ICD-10-CM | POA: Diagnosis not present

## 2021-09-02 DIAGNOSIS — M9903 Segmental and somatic dysfunction of lumbar region: Secondary | ICD-10-CM | POA: Diagnosis not present

## 2021-09-02 DIAGNOSIS — M4316 Spondylolisthesis, lumbar region: Secondary | ICD-10-CM | POA: Diagnosis not present

## 2021-09-08 DIAGNOSIS — M9903 Segmental and somatic dysfunction of lumbar region: Secondary | ICD-10-CM | POA: Diagnosis not present

## 2021-09-08 DIAGNOSIS — M4316 Spondylolisthesis, lumbar region: Secondary | ICD-10-CM | POA: Diagnosis not present

## 2021-09-08 DIAGNOSIS — M9902 Segmental and somatic dysfunction of thoracic region: Secondary | ICD-10-CM | POA: Diagnosis not present

## 2021-09-08 DIAGNOSIS — M9905 Segmental and somatic dysfunction of pelvic region: Secondary | ICD-10-CM | POA: Diagnosis not present

## 2021-09-08 DIAGNOSIS — M546 Pain in thoracic spine: Secondary | ICD-10-CM | POA: Diagnosis not present

## 2021-09-08 DIAGNOSIS — S39012A Strain of muscle, fascia and tendon of lower back, initial encounter: Secondary | ICD-10-CM | POA: Diagnosis not present

## 2021-09-12 DIAGNOSIS — M9902 Segmental and somatic dysfunction of thoracic region: Secondary | ICD-10-CM | POA: Diagnosis not present

## 2021-09-12 DIAGNOSIS — M546 Pain in thoracic spine: Secondary | ICD-10-CM | POA: Diagnosis not present

## 2021-09-12 DIAGNOSIS — S39012A Strain of muscle, fascia and tendon of lower back, initial encounter: Secondary | ICD-10-CM | POA: Diagnosis not present

## 2021-09-12 DIAGNOSIS — M9903 Segmental and somatic dysfunction of lumbar region: Secondary | ICD-10-CM | POA: Diagnosis not present

## 2021-09-12 DIAGNOSIS — M9905 Segmental and somatic dysfunction of pelvic region: Secondary | ICD-10-CM | POA: Diagnosis not present

## 2021-09-12 DIAGNOSIS — M4316 Spondylolisthesis, lumbar region: Secondary | ICD-10-CM | POA: Diagnosis not present

## 2021-09-15 DIAGNOSIS — M9903 Segmental and somatic dysfunction of lumbar region: Secondary | ICD-10-CM | POA: Diagnosis not present

## 2021-09-15 DIAGNOSIS — M546 Pain in thoracic spine: Secondary | ICD-10-CM | POA: Diagnosis not present

## 2021-09-15 DIAGNOSIS — M9902 Segmental and somatic dysfunction of thoracic region: Secondary | ICD-10-CM | POA: Diagnosis not present

## 2021-09-15 DIAGNOSIS — M9905 Segmental and somatic dysfunction of pelvic region: Secondary | ICD-10-CM | POA: Diagnosis not present

## 2021-09-15 DIAGNOSIS — S39012A Strain of muscle, fascia and tendon of lower back, initial encounter: Secondary | ICD-10-CM | POA: Diagnosis not present

## 2021-09-15 DIAGNOSIS — M4316 Spondylolisthesis, lumbar region: Secondary | ICD-10-CM | POA: Diagnosis not present

## 2021-09-19 DIAGNOSIS — M4316 Spondylolisthesis, lumbar region: Secondary | ICD-10-CM | POA: Diagnosis not present

## 2021-09-19 DIAGNOSIS — M9902 Segmental and somatic dysfunction of thoracic region: Secondary | ICD-10-CM | POA: Diagnosis not present

## 2021-09-19 DIAGNOSIS — M546 Pain in thoracic spine: Secondary | ICD-10-CM | POA: Diagnosis not present

## 2021-09-19 DIAGNOSIS — M9903 Segmental and somatic dysfunction of lumbar region: Secondary | ICD-10-CM | POA: Diagnosis not present

## 2021-09-19 DIAGNOSIS — M9905 Segmental and somatic dysfunction of pelvic region: Secondary | ICD-10-CM | POA: Diagnosis not present

## 2021-09-19 DIAGNOSIS — S39012A Strain of muscle, fascia and tendon of lower back, initial encounter: Secondary | ICD-10-CM | POA: Diagnosis not present

## 2021-09-22 DIAGNOSIS — M9903 Segmental and somatic dysfunction of lumbar region: Secondary | ICD-10-CM | POA: Diagnosis not present

## 2021-09-22 DIAGNOSIS — M9902 Segmental and somatic dysfunction of thoracic region: Secondary | ICD-10-CM | POA: Diagnosis not present

## 2021-09-22 DIAGNOSIS — M9905 Segmental and somatic dysfunction of pelvic region: Secondary | ICD-10-CM | POA: Diagnosis not present

## 2021-09-22 DIAGNOSIS — M546 Pain in thoracic spine: Secondary | ICD-10-CM | POA: Diagnosis not present

## 2021-09-22 DIAGNOSIS — S39012A Strain of muscle, fascia and tendon of lower back, initial encounter: Secondary | ICD-10-CM | POA: Diagnosis not present

## 2021-09-22 DIAGNOSIS — M4316 Spondylolisthesis, lumbar region: Secondary | ICD-10-CM | POA: Diagnosis not present

## 2021-09-26 DIAGNOSIS — M546 Pain in thoracic spine: Secondary | ICD-10-CM | POA: Diagnosis not present

## 2021-09-26 DIAGNOSIS — M9905 Segmental and somatic dysfunction of pelvic region: Secondary | ICD-10-CM | POA: Diagnosis not present

## 2021-09-26 DIAGNOSIS — M4316 Spondylolisthesis, lumbar region: Secondary | ICD-10-CM | POA: Diagnosis not present

## 2021-09-26 DIAGNOSIS — M9902 Segmental and somatic dysfunction of thoracic region: Secondary | ICD-10-CM | POA: Diagnosis not present

## 2021-09-26 DIAGNOSIS — M9903 Segmental and somatic dysfunction of lumbar region: Secondary | ICD-10-CM | POA: Diagnosis not present

## 2021-09-26 DIAGNOSIS — S39012A Strain of muscle, fascia and tendon of lower back, initial encounter: Secondary | ICD-10-CM | POA: Diagnosis not present

## 2021-09-29 DIAGNOSIS — M9903 Segmental and somatic dysfunction of lumbar region: Secondary | ICD-10-CM | POA: Diagnosis not present

## 2021-09-29 DIAGNOSIS — S39012A Strain of muscle, fascia and tendon of lower back, initial encounter: Secondary | ICD-10-CM | POA: Diagnosis not present

## 2021-09-29 DIAGNOSIS — M546 Pain in thoracic spine: Secondary | ICD-10-CM | POA: Diagnosis not present

## 2021-09-29 DIAGNOSIS — M9902 Segmental and somatic dysfunction of thoracic region: Secondary | ICD-10-CM | POA: Diagnosis not present

## 2021-09-29 DIAGNOSIS — M4316 Spondylolisthesis, lumbar region: Secondary | ICD-10-CM | POA: Diagnosis not present

## 2021-09-29 DIAGNOSIS — M9905 Segmental and somatic dysfunction of pelvic region: Secondary | ICD-10-CM | POA: Diagnosis not present

## 2021-10-02 DIAGNOSIS — H1131 Conjunctival hemorrhage, right eye: Secondary | ICD-10-CM | POA: Diagnosis not present

## 2021-10-03 DIAGNOSIS — S39012A Strain of muscle, fascia and tendon of lower back, initial encounter: Secondary | ICD-10-CM | POA: Diagnosis not present

## 2021-10-03 DIAGNOSIS — M9905 Segmental and somatic dysfunction of pelvic region: Secondary | ICD-10-CM | POA: Diagnosis not present

## 2021-10-03 DIAGNOSIS — M4316 Spondylolisthesis, lumbar region: Secondary | ICD-10-CM | POA: Diagnosis not present

## 2021-10-03 DIAGNOSIS — M9902 Segmental and somatic dysfunction of thoracic region: Secondary | ICD-10-CM | POA: Diagnosis not present

## 2021-10-03 DIAGNOSIS — M9903 Segmental and somatic dysfunction of lumbar region: Secondary | ICD-10-CM | POA: Diagnosis not present

## 2021-10-03 DIAGNOSIS — M546 Pain in thoracic spine: Secondary | ICD-10-CM | POA: Diagnosis not present

## 2021-10-08 DIAGNOSIS — M546 Pain in thoracic spine: Secondary | ICD-10-CM | POA: Diagnosis not present

## 2021-10-08 DIAGNOSIS — M9905 Segmental and somatic dysfunction of pelvic region: Secondary | ICD-10-CM | POA: Diagnosis not present

## 2021-10-08 DIAGNOSIS — S39012A Strain of muscle, fascia and tendon of lower back, initial encounter: Secondary | ICD-10-CM | POA: Diagnosis not present

## 2021-10-08 DIAGNOSIS — M9903 Segmental and somatic dysfunction of lumbar region: Secondary | ICD-10-CM | POA: Diagnosis not present

## 2021-10-08 DIAGNOSIS — M9902 Segmental and somatic dysfunction of thoracic region: Secondary | ICD-10-CM | POA: Diagnosis not present

## 2021-10-08 DIAGNOSIS — M4316 Spondylolisthesis, lumbar region: Secondary | ICD-10-CM | POA: Diagnosis not present

## 2021-10-15 DIAGNOSIS — M9905 Segmental and somatic dysfunction of pelvic region: Secondary | ICD-10-CM | POA: Diagnosis not present

## 2021-10-15 DIAGNOSIS — M9902 Segmental and somatic dysfunction of thoracic region: Secondary | ICD-10-CM | POA: Diagnosis not present

## 2021-10-15 DIAGNOSIS — M4316 Spondylolisthesis, lumbar region: Secondary | ICD-10-CM | POA: Diagnosis not present

## 2021-10-15 DIAGNOSIS — S39012A Strain of muscle, fascia and tendon of lower back, initial encounter: Secondary | ICD-10-CM | POA: Diagnosis not present

## 2021-10-15 DIAGNOSIS — M546 Pain in thoracic spine: Secondary | ICD-10-CM | POA: Diagnosis not present

## 2021-10-15 DIAGNOSIS — M9903 Segmental and somatic dysfunction of lumbar region: Secondary | ICD-10-CM | POA: Diagnosis not present

## 2021-10-17 DIAGNOSIS — E559 Vitamin D deficiency, unspecified: Secondary | ICD-10-CM | POA: Diagnosis not present

## 2021-10-17 DIAGNOSIS — E1165 Type 2 diabetes mellitus with hyperglycemia: Secondary | ICD-10-CM | POA: Diagnosis not present

## 2021-10-17 DIAGNOSIS — I10 Essential (primary) hypertension: Secondary | ICD-10-CM | POA: Diagnosis not present

## 2021-10-21 DIAGNOSIS — K5900 Constipation, unspecified: Secondary | ICD-10-CM | POA: Diagnosis not present

## 2021-10-21 DIAGNOSIS — E559 Vitamin D deficiency, unspecified: Secondary | ICD-10-CM | POA: Diagnosis not present

## 2021-10-21 DIAGNOSIS — M545 Low back pain, unspecified: Secondary | ICD-10-CM | POA: Diagnosis not present

## 2021-10-21 DIAGNOSIS — E1165 Type 2 diabetes mellitus with hyperglycemia: Secondary | ICD-10-CM | POA: Diagnosis not present

## 2021-10-21 DIAGNOSIS — E782 Mixed hyperlipidemia: Secondary | ICD-10-CM | POA: Diagnosis not present

## 2021-10-21 DIAGNOSIS — Z9114 Patient's other noncompliance with medication regimen: Secondary | ICD-10-CM | POA: Diagnosis not present

## 2021-10-21 DIAGNOSIS — Z6835 Body mass index (BMI) 35.0-35.9, adult: Secondary | ICD-10-CM | POA: Diagnosis not present

## 2021-10-21 DIAGNOSIS — I739 Peripheral vascular disease, unspecified: Secondary | ICD-10-CM | POA: Diagnosis not present

## 2021-10-21 DIAGNOSIS — E6609 Other obesity due to excess calories: Secondary | ICD-10-CM | POA: Diagnosis not present

## 2021-10-21 DIAGNOSIS — I10 Essential (primary) hypertension: Secondary | ICD-10-CM | POA: Diagnosis not present

## 2021-10-22 DIAGNOSIS — M4316 Spondylolisthesis, lumbar region: Secondary | ICD-10-CM | POA: Diagnosis not present

## 2021-10-22 DIAGNOSIS — M546 Pain in thoracic spine: Secondary | ICD-10-CM | POA: Diagnosis not present

## 2021-10-22 DIAGNOSIS — M9902 Segmental and somatic dysfunction of thoracic region: Secondary | ICD-10-CM | POA: Diagnosis not present

## 2021-10-22 DIAGNOSIS — S39012A Strain of muscle, fascia and tendon of lower back, initial encounter: Secondary | ICD-10-CM | POA: Diagnosis not present

## 2021-10-22 DIAGNOSIS — M9903 Segmental and somatic dysfunction of lumbar region: Secondary | ICD-10-CM | POA: Diagnosis not present

## 2021-10-22 DIAGNOSIS — M9905 Segmental and somatic dysfunction of pelvic region: Secondary | ICD-10-CM | POA: Diagnosis not present

## 2021-10-31 DIAGNOSIS — S39012A Strain of muscle, fascia and tendon of lower back, initial encounter: Secondary | ICD-10-CM | POA: Diagnosis not present

## 2021-10-31 DIAGNOSIS — M546 Pain in thoracic spine: Secondary | ICD-10-CM | POA: Diagnosis not present

## 2021-10-31 DIAGNOSIS — M4316 Spondylolisthesis, lumbar region: Secondary | ICD-10-CM | POA: Diagnosis not present

## 2021-10-31 DIAGNOSIS — M9903 Segmental and somatic dysfunction of lumbar region: Secondary | ICD-10-CM | POA: Diagnosis not present

## 2021-10-31 DIAGNOSIS — M9905 Segmental and somatic dysfunction of pelvic region: Secondary | ICD-10-CM | POA: Diagnosis not present

## 2021-10-31 DIAGNOSIS — M9902 Segmental and somatic dysfunction of thoracic region: Secondary | ICD-10-CM | POA: Diagnosis not present

## 2021-11-05 DIAGNOSIS — M546 Pain in thoracic spine: Secondary | ICD-10-CM | POA: Diagnosis not present

## 2021-11-05 DIAGNOSIS — S39012A Strain of muscle, fascia and tendon of lower back, initial encounter: Secondary | ICD-10-CM | POA: Diagnosis not present

## 2021-11-05 DIAGNOSIS — M9905 Segmental and somatic dysfunction of pelvic region: Secondary | ICD-10-CM | POA: Diagnosis not present

## 2021-11-05 DIAGNOSIS — M9902 Segmental and somatic dysfunction of thoracic region: Secondary | ICD-10-CM | POA: Diagnosis not present

## 2021-11-05 DIAGNOSIS — M4316 Spondylolisthesis, lumbar region: Secondary | ICD-10-CM | POA: Diagnosis not present

## 2021-11-05 DIAGNOSIS — M9903 Segmental and somatic dysfunction of lumbar region: Secondary | ICD-10-CM | POA: Diagnosis not present

## 2021-11-12 DIAGNOSIS — M9905 Segmental and somatic dysfunction of pelvic region: Secondary | ICD-10-CM | POA: Diagnosis not present

## 2021-11-12 DIAGNOSIS — S39012A Strain of muscle, fascia and tendon of lower back, initial encounter: Secondary | ICD-10-CM | POA: Diagnosis not present

## 2021-11-12 DIAGNOSIS — M546 Pain in thoracic spine: Secondary | ICD-10-CM | POA: Diagnosis not present

## 2021-11-12 DIAGNOSIS — M9902 Segmental and somatic dysfunction of thoracic region: Secondary | ICD-10-CM | POA: Diagnosis not present

## 2021-11-12 DIAGNOSIS — M4316 Spondylolisthesis, lumbar region: Secondary | ICD-10-CM | POA: Diagnosis not present

## 2021-11-12 DIAGNOSIS — M9903 Segmental and somatic dysfunction of lumbar region: Secondary | ICD-10-CM | POA: Diagnosis not present

## 2021-11-14 DIAGNOSIS — Z20822 Contact with and (suspected) exposure to covid-19: Secondary | ICD-10-CM | POA: Diagnosis not present

## 2021-11-17 DIAGNOSIS — M25551 Pain in right hip: Secondary | ICD-10-CM | POA: Diagnosis not present

## 2021-11-17 DIAGNOSIS — M5030 Other cervical disc degeneration, unspecified cervical region: Secondary | ICD-10-CM | POA: Diagnosis not present

## 2021-11-17 DIAGNOSIS — M4316 Spondylolisthesis, lumbar region: Secondary | ICD-10-CM | POA: Diagnosis not present

## 2021-11-19 DIAGNOSIS — M9903 Segmental and somatic dysfunction of lumbar region: Secondary | ICD-10-CM | POA: Diagnosis not present

## 2021-11-19 DIAGNOSIS — M546 Pain in thoracic spine: Secondary | ICD-10-CM | POA: Diagnosis not present

## 2021-11-19 DIAGNOSIS — S39012A Strain of muscle, fascia and tendon of lower back, initial encounter: Secondary | ICD-10-CM | POA: Diagnosis not present

## 2021-11-19 DIAGNOSIS — M9905 Segmental and somatic dysfunction of pelvic region: Secondary | ICD-10-CM | POA: Diagnosis not present

## 2021-11-19 DIAGNOSIS — M4316 Spondylolisthesis, lumbar region: Secondary | ICD-10-CM | POA: Diagnosis not present

## 2021-11-19 DIAGNOSIS — M9902 Segmental and somatic dysfunction of thoracic region: Secondary | ICD-10-CM | POA: Diagnosis not present

## 2021-11-24 DIAGNOSIS — M545 Low back pain, unspecified: Secondary | ICD-10-CM | POA: Diagnosis not present

## 2021-11-24 DIAGNOSIS — R293 Abnormal posture: Secondary | ICD-10-CM | POA: Diagnosis not present

## 2021-11-24 DIAGNOSIS — R531 Weakness: Secondary | ICD-10-CM | POA: Diagnosis not present

## 2021-11-24 DIAGNOSIS — M542 Cervicalgia: Secondary | ICD-10-CM | POA: Diagnosis not present

## 2021-11-24 DIAGNOSIS — M2569 Stiffness of other specified joint, not elsewhere classified: Secondary | ICD-10-CM | POA: Diagnosis not present

## 2021-11-26 DIAGNOSIS — M2569 Stiffness of other specified joint, not elsewhere classified: Secondary | ICD-10-CM | POA: Diagnosis not present

## 2021-11-26 DIAGNOSIS — R531 Weakness: Secondary | ICD-10-CM | POA: Diagnosis not present

## 2021-11-26 DIAGNOSIS — M545 Low back pain, unspecified: Secondary | ICD-10-CM | POA: Diagnosis not present

## 2021-11-26 DIAGNOSIS — M542 Cervicalgia: Secondary | ICD-10-CM | POA: Diagnosis not present

## 2021-11-26 DIAGNOSIS — R293 Abnormal posture: Secondary | ICD-10-CM | POA: Diagnosis not present

## 2021-11-27 DIAGNOSIS — H31093 Other chorioretinal scars, bilateral: Secondary | ICD-10-CM | POA: Diagnosis not present

## 2021-11-27 DIAGNOSIS — H43823 Vitreomacular adhesion, bilateral: Secondary | ICD-10-CM | POA: Diagnosis not present

## 2021-11-27 DIAGNOSIS — E113593 Type 2 diabetes mellitus with proliferative diabetic retinopathy without macular edema, bilateral: Secondary | ICD-10-CM | POA: Diagnosis not present

## 2021-11-27 DIAGNOSIS — H35033 Hypertensive retinopathy, bilateral: Secondary | ICD-10-CM | POA: Diagnosis not present

## 2021-11-28 DIAGNOSIS — R531 Weakness: Secondary | ICD-10-CM | POA: Diagnosis not present

## 2021-11-28 DIAGNOSIS — M542 Cervicalgia: Secondary | ICD-10-CM | POA: Diagnosis not present

## 2021-11-28 DIAGNOSIS — M545 Low back pain, unspecified: Secondary | ICD-10-CM | POA: Diagnosis not present

## 2021-11-28 DIAGNOSIS — M2569 Stiffness of other specified joint, not elsewhere classified: Secondary | ICD-10-CM | POA: Diagnosis not present

## 2021-11-28 DIAGNOSIS — R293 Abnormal posture: Secondary | ICD-10-CM | POA: Diagnosis not present

## 2021-12-01 DIAGNOSIS — M2569 Stiffness of other specified joint, not elsewhere classified: Secondary | ICD-10-CM | POA: Diagnosis not present

## 2021-12-01 DIAGNOSIS — M545 Low back pain, unspecified: Secondary | ICD-10-CM | POA: Diagnosis not present

## 2021-12-01 DIAGNOSIS — R531 Weakness: Secondary | ICD-10-CM | POA: Diagnosis not present

## 2021-12-01 DIAGNOSIS — M542 Cervicalgia: Secondary | ICD-10-CM | POA: Diagnosis not present

## 2021-12-01 DIAGNOSIS — R293 Abnormal posture: Secondary | ICD-10-CM | POA: Diagnosis not present

## 2021-12-03 DIAGNOSIS — R293 Abnormal posture: Secondary | ICD-10-CM | POA: Diagnosis not present

## 2021-12-03 DIAGNOSIS — M545 Low back pain, unspecified: Secondary | ICD-10-CM | POA: Diagnosis not present

## 2021-12-03 DIAGNOSIS — R531 Weakness: Secondary | ICD-10-CM | POA: Diagnosis not present

## 2021-12-03 DIAGNOSIS — M542 Cervicalgia: Secondary | ICD-10-CM | POA: Diagnosis not present

## 2021-12-03 DIAGNOSIS — M2569 Stiffness of other specified joint, not elsewhere classified: Secondary | ICD-10-CM | POA: Diagnosis not present

## 2021-12-08 DIAGNOSIS — M2569 Stiffness of other specified joint, not elsewhere classified: Secondary | ICD-10-CM | POA: Diagnosis not present

## 2021-12-08 DIAGNOSIS — R531 Weakness: Secondary | ICD-10-CM | POA: Diagnosis not present

## 2021-12-08 DIAGNOSIS — M545 Low back pain, unspecified: Secondary | ICD-10-CM | POA: Diagnosis not present

## 2021-12-08 DIAGNOSIS — R293 Abnormal posture: Secondary | ICD-10-CM | POA: Diagnosis not present

## 2021-12-08 DIAGNOSIS — M542 Cervicalgia: Secondary | ICD-10-CM | POA: Diagnosis not present

## 2021-12-10 DIAGNOSIS — R531 Weakness: Secondary | ICD-10-CM | POA: Diagnosis not present

## 2021-12-10 DIAGNOSIS — M542 Cervicalgia: Secondary | ICD-10-CM | POA: Diagnosis not present

## 2021-12-10 DIAGNOSIS — M545 Low back pain, unspecified: Secondary | ICD-10-CM | POA: Diagnosis not present

## 2021-12-10 DIAGNOSIS — R293 Abnormal posture: Secondary | ICD-10-CM | POA: Diagnosis not present

## 2021-12-10 DIAGNOSIS — M2569 Stiffness of other specified joint, not elsewhere classified: Secondary | ICD-10-CM | POA: Diagnosis not present

## 2021-12-12 DIAGNOSIS — M545 Low back pain, unspecified: Secondary | ICD-10-CM | POA: Diagnosis not present

## 2021-12-12 DIAGNOSIS — M542 Cervicalgia: Secondary | ICD-10-CM | POA: Diagnosis not present

## 2021-12-12 DIAGNOSIS — R531 Weakness: Secondary | ICD-10-CM | POA: Diagnosis not present

## 2021-12-12 DIAGNOSIS — R293 Abnormal posture: Secondary | ICD-10-CM | POA: Diagnosis not present

## 2021-12-12 DIAGNOSIS — M2569 Stiffness of other specified joint, not elsewhere classified: Secondary | ICD-10-CM | POA: Diagnosis not present

## 2021-12-15 ENCOUNTER — Other Ambulatory Visit: Payer: Self-pay | Admitting: Sports Medicine

## 2021-12-15 ENCOUNTER — Other Ambulatory Visit (HOSPITAL_COMMUNITY): Payer: Self-pay | Admitting: Sports Medicine

## 2021-12-15 DIAGNOSIS — M542 Cervicalgia: Secondary | ICD-10-CM

## 2021-12-15 DIAGNOSIS — M4316 Spondylolisthesis, lumbar region: Secondary | ICD-10-CM | POA: Diagnosis not present

## 2021-12-15 DIAGNOSIS — M5030 Other cervical disc degeneration, unspecified cervical region: Secondary | ICD-10-CM | POA: Diagnosis not present

## 2021-12-16 ENCOUNTER — Emergency Department (HOSPITAL_COMMUNITY): Payer: Medicare Other

## 2021-12-16 ENCOUNTER — Encounter (HOSPITAL_COMMUNITY): Payer: Self-pay | Admitting: *Deleted

## 2021-12-16 ENCOUNTER — Emergency Department (HOSPITAL_COMMUNITY)
Admission: EM | Admit: 2021-12-16 | Discharge: 2021-12-16 | Disposition: A | Discharge status: Home / Self Care | Payer: Medicare Other | Attending: Emergency Medicine | Admitting: Emergency Medicine

## 2021-12-16 DIAGNOSIS — E119 Type 2 diabetes mellitus without complications: Secondary | ICD-10-CM | POA: Insufficient documentation

## 2021-12-16 DIAGNOSIS — I10 Essential (primary) hypertension: Secondary | ICD-10-CM | POA: Diagnosis not present

## 2021-12-16 DIAGNOSIS — R519 Headache, unspecified: Secondary | ICD-10-CM | POA: Diagnosis not present

## 2021-12-16 DIAGNOSIS — M62838 Other muscle spasm: Secondary | ICD-10-CM | POA: Diagnosis not present

## 2021-12-16 DIAGNOSIS — G44209 Tension-type headache, unspecified, not intractable: Secondary | ICD-10-CM | POA: Insufficient documentation

## 2021-12-16 DIAGNOSIS — E876 Hypokalemia: Secondary | ICD-10-CM | POA: Insufficient documentation

## 2021-12-16 LAB — BASIC METABOLIC PANEL
Anion gap: 9 (ref 5–15)
BUN: 13 mg/dL (ref 8–23)
CO2: 29 mmol/L (ref 22–32)
Calcium: 9.5 mg/dL (ref 8.9–10.3)
Chloride: 99 mmol/L (ref 98–111)
Creatinine, Ser: 0.97 mg/dL (ref 0.44–1.00)
GFR, Estimated: >60 mL/min (ref >60)
Glucose, Bld: 81 mg/dL (ref 70–99)
Potassium: 3.3 mmol/L — ABNORMAL LOW (ref 3.5–5.1)
Sodium: 137 mmol/L (ref 135–145)

## 2021-12-16 LAB — CBC WITH DIFFERENTIAL/PLATELET
Abs Immature Granulocytes: 0.01 10*3/uL (ref 0.00–0.07)
Basophils Absolute: 0 10*3/uL (ref 0.0–0.1)
Basophils Relative: 1 %
Eosinophils Absolute: 0.1 10*3/uL (ref 0.0–0.5)
Eosinophils Relative: 1 %
HCT: 45.1 % (ref 36.0–46.0)
Hemoglobin: 14.7 g/dL (ref 12.0–15.0)
Immature Granulocytes: 0 %
Lymphocytes Relative: 44 %
Lymphs Abs: 2.9 10*3/uL (ref 0.7–4.0)
MCH: 29.3 pg (ref 26.0–34.0)
MCHC: 32.6 g/dL (ref 30.0–36.0)
MCV: 89.8 fL (ref 80.0–100.0)
Monocytes Absolute: 0.6 10*3/uL (ref 0.1–1.0)
Monocytes Relative: 9 %
Neutro Abs: 3 10*3/uL (ref 1.7–7.7)
Neutrophils Relative %: 45 %
Platelets: 343 10*3/uL (ref 150–400)
RBC: 5.02 MIL/uL (ref 3.87–5.11)
RDW: 12.5 % (ref 11.5–15.5)
WBC: 6.7 10*3/uL (ref 4.0–10.5)
nRBC: 0 % (ref 0.0–0.2)

## 2021-12-16 MED ORDER — CYCLOBENZAPRINE HCL 10 MG PO TABS
10.0000 mg | ORAL_TABLET | Freq: Once | ORAL | Status: AC
  Administered 2021-12-16: 10 mg via ORAL
  Filled 2021-12-16: qty 1

## 2021-12-16 MED ORDER — IOHEXOL 300 MG/ML  SOLN
100.0000 mL | Freq: Once | INTRAMUSCULAR | Status: AC | PRN
  Administered 2021-12-16: 75 mL via INTRAVENOUS

## 2021-12-16 MED ORDER — NAPROXEN 250 MG PO TABS
500.0000 mg | ORAL_TABLET | Freq: Once | ORAL | Status: AC
  Administered 2021-12-16: 500 mg via ORAL
  Filled 2021-12-16: qty 2

## 2021-12-16 MED ORDER — NAPROXEN 500 MG PO TABS: 500.0000 mg | ORAL_TABLET | Freq: Two times a day (BID) | ORAL | 0 refills | Status: AC

## 2021-12-16 NOTE — ED Triage Notes (Signed)
States she had a covid vaccine yesterday ?

## 2021-12-16 NOTE — ED Provider Notes (Signed)
[facility] EMERGENCY DEPARTMENT Provider Note   CSN: [id number] Arrival date & time: 12/16/21  1414     History Chief Complaint  Patient presents with   Headache    Stacy [name] is a 66 y.o. female who presents to the emergency department with left sided headache started yesterday after receiving a vaccine in the right arm.  Patient was worried about a stroke and came to the emergency department to get evaluated.  She denies any focal weakness, numbness, syncope, fever, chills, myalgias.   Headache     Home Medications Prior to Admission medications   Medication Sig Start Date End Date Taking? Authorizing Provider  naproxen (NAPROSYN) 500 MG tablet Take 1 tablet (500 mg total) by mouth 2 (two) times daily. 12/16/21  Yes Raul Del, Canisha Issac M, PA-C  amLODipine (NORVASC) 5 MG tablet Take 5 mg by mouth daily.    [provider]  Bioflavonoid Products (BIOFLEX PO) Take by mouth.    [provider]  Cholecalciferol (VITAMIN D3) 125 MCG (5000 UT) CAPS Take 1 capsule (5,000 Units total) by mouth daily. 04/29/20   Cassandria Anger, MD  cyanocobalamin 1000 MCG tablet Take 3,000 mcg by mouth daily.    [provider]  glucose blood (FREESTYLE TEST STRIPS) test strip USE TO TEST BLOOD SUGAR FOUR TIMES DAILY AS DIRECTED. 02/13/19   Nida, Marella Chimes, MD  Insulin Syringe-Needle U-100 (INSULIN SYRINGE .5CC/31GX5/16") 31G X 5/16" 0.5 ML MISC USE TO INJECT INSULIN TWICE DAILY 11/18/20   Cassandria Anger, MD  losartan-hydrochlorothiazide (HYZAAR) 100-25 MG tablet TAKE 1 TABLET BY MOUTH DAILY 07/22/20   Cassandria Anger, MD  NOVOLIN 70/30 (70-30) 100 UNIT/ML injection INJECT 40 UNITS UNDER THE SKIN TWICE DAILY WITH A MEAL 06/11/21   Cassandria Anger, MD  rosuvastatin (CRESTOR) 10 MG tablet Take 10 mg by mouth daily.    [provider]  Semaglutide (OZEMPIC, 0.25 OR 0.5 MG/DOSE, Christine) Inject 0.5 mg into the skin once a week.    [provider]   vitamin C (ASCORBIC ACID) 500 MG tablet Take 500 mg by mouth daily.    [provider]  vitamin E (VITAMIN E) 180 MG (400 UNITS) capsule Take 400 Units by mouth daily.    [provider]      Allergies    Patient has no known allergies.    Review of Systems   Review of Systems  Neurological:  Positive for headaches.  All other systems reviewed and are negative.  Physical Exam Updated Vital Signs BP (!) 153/76 (BP Location: Right Arm)    Pulse 67    Temp 98.4 F (36.9 C) (Oral)    Resp (!) 21    Ht 5' 1.5" (1.562 m)    Wt 81.6 kg    SpO2 100%    BMI 33.46 kg/m  Physical Exam Vitals and nursing note reviewed.  Constitutional:      General: She is not in acute distress.    Appearance: Normal appearance.  HENT:     Head: Normocephalic and atraumatic.  Eyes:     General:        Right eye: No discharge.        Left eye: No discharge.     Conjunctiva/sclera: Conjunctivae normal.  Neck:     Comments: NeckThere is tenderness to the left trapezius muscle tends into the left paracervical muscles.  Headache is reproducible with palpation of these muscles. Cardiovascular:     Comments: Regular rate  and rhythm.  S1/S2 are distinct without any evidence of murmur, rubs, or gallops.  Radial pulses are 2+ bilaterally.  Dorsalis pedis pulses are 2+ bilaterally.  No evidence of pedal edema. Pulmonary:     Effort: Pulmonary effort is normal.     Comments: Clear to auscultation bilaterally.  Normal effort.  No respiratory distress.  No evidence of wheezes, rales, or rhonchi heard throughout. Abdominal:     General: Abdomen is flat. Bowel sounds are normal. There is no distension.     Tenderness: There is no abdominal tenderness. There is no guarding or rebound.  Musculoskeletal:        General: Normal range of motion.     Cervical back: Full passive range of motion without pain, normal range of motion and neck supple.  Skin:    General: Skin is warm and dry.     Findings: No  rash.  Neurological:     General: No focal deficit present.     Mental Status: She is alert.     GCS: GCS eye subscore is 4. GCS verbal subscore is 5. GCS motor subscore is 6.     Comments: Cranial nerves II through XII are intact.  5/5 strength of the upper and lower extremities.  Normal sensation to the upper and lower extremities.  Pupils are equal round and reactive to light.  No nystagmus with extraocular movements.  No pronator drift.  Psychiatric:        Mood and Affect: Mood normal.        Behavior: Behavior normal.    ED Results / Procedures / Treatments   Labs (all labs ordered are listed, but only abnormal results are displayed) Labs Reviewed  BASIC METABOLIC PANEL - Abnormal; Notable for the following components:      Result Value   Potassium 3.3 (*)    All other components within normal limits  CBC WITH DIFFERENTIAL/PLATELET    EKG None  Radiology CT VENOGRAM HEAD  Result Date: 12/16/2021 CLINICAL DATA:  headache recent vaccination. R/o venous thrombosis EXAM: CT VENOGRAM HEAD TECHNIQUE: Venographic phase images of the brain were obtained following the administration of intravenous contrast. Multiplanar reformats and maximum intensity projections were generated. RADIATION DOSE REDUCTION: This exam was performed according to the departmental dose-optimization program which includes automated exposure control, adjustment of the mA and/or kV according to patient size and/or use of iterative reconstruction technique. CONTRAST:  67m OMNIPAQUE IOHEXOL 300 MG/ML  SOLN COMPARISON:  MRA Head 06/21/2015. FINDINGS: On the noncontrast head CT: Apparent cortical hypoattenuation in the high right frontal lobe edema (series 2, image 22). Otherwise, no evidence of acute large vascular territory infarct, acute hemorrhage, mass lesion, midline shift, or hydrocephalus. Patchy white matter hypoattenuation, nonspecific but compatible with chronic microvascular ischemic disease. On the CT  venogram: No evidence of dural venous sinus thrombosis. The superior sagittal sinus, transverse, sigmoid, and straight sinuses are patent. The visualized deep cerebral veins are patent. IMPRESSION: 1. No evidence of dural venous sinus thrombosis. 2. Apparent cortical hypoattenuation in the high right frontal lobe. This may represent an age indeterminate small cortical infarct versus chronic microvascular ischemic disease. If there is concern for acute or subacute infarct then recommend MRI for further evaluation. Electronically Signed   By: FMargaretha SheffieldM.D.   On: 12/16/2021 18:41    Procedures Procedures    Medications Ordered in ED Medications  naproxen (NAPROSYN) tablet 500 mg (500 mg Oral Given 12/16/21 1640)  cyclobenzaprine (FLEXERIL) tablet 10 mg (10  mg Oral Given 12/16/21 1712)  iohexol (OMNIPAQUE) 300 MG/ML solution 100 mL (75 mLs Intravenous Contrast Given 12/16/21 1826)    ED Course/ Medical Decision Making/ A&P Clinical Course as of 12/16/21 1925  Tue Dec 16, 2021  1700 I discussed this case with my attending physician who cosigned this note including patient's presenting symptoms, physical exam, and planned diagnostics and interventions. Attending physician stated agreement with plan or made changes to plan which were implemented. He recommends MRV head and flexeril to ensure this is not a venous thrombosis given her recent vaccination.    [CF]    Clinical Course User Index [CF] Hendricks Limes, PA-C                           Medical Decision Making Amount and/or Complexity of Data Reviewed Labs: ordered. Radiology: ordered.  Risk Prescription drug management.   This patient presents to the ED for concern of headache, this involves an extensive number of treatment options, and is a complaint that carries with it a high risk of complications and morbidity.  The differential diagnosis includes benign headache.  I doubt meningitis, subarachnoid hemorrhage, intracranial  hemorrhage, temporal arteritis, tumor, space-occupying lesion, intracranial hypertension.   Co morbidities that complicate the patient evaluation  Obesity Diabetes mellitus Hypertension Hyperlipidemia   Additional history obtained:  Additional history obtained from nursing note   Lab Tests:  I Ordered, and personally interpreted labs.  The pertinent results include: CBC which was normal.  And BMP which showed hypokalemia.   Imaging Studies ordered:  I ordered imaging studies including CT venogram head I independently visualized and interpreted imaging which showed no evidence of dural thrombosis. I agree with the radiologist interpretation   Cardiac Monitoring:  The patient was maintained on a cardiac monitor.  I personally viewed and interpreted the cardiac monitored which showed an underlying rhythm of: Normal sinus rhythm   Medicines ordered and prescription drug management:  I ordered medication including naproxen and Flexeril for pain Reevaluation of the patient after these medicines showed that the patient improved I have reviewed the patients home medicines and have made adjustments as needed   Problem List / ED Course:  Headache likely secondary to spasm of trapezius muscle.  Neurological exam was without evidence of meningismus, altered mental status, focal neurological findings myelosuppression for meningitis, encephalitis, or stroke.  Presentation is not consistent with acute intracranial bleed.  No meningismus on exam and I doubt meningitis.  Patient has no clinical signs of increased intracranial pressure.  I doubt space-occupying lesion.  Patient feeling better after muscle relaxer and naproxen.  I notified her of CT results.  She will need to have this followed up in the outpatient setting.  Patient amenable this plan.  Strict turn precautions given.  She is safe discharge.   Reevaluation:  After the interventions noted above, I reevaluated the patient  and found that they have :improved      Dispostion:  After consideration of the diagnostic results and the patients response to treatment, I feel that the patent would benefit from further evaluation in the outpatient setting.   Final Clinical Impression(s) / ED Diagnoses Final diagnoses:  Nonintractable headache, unspecified chronicity pattern, unspecified headache type  Trapezius muscle spasm    Rx / DC Orders ED Discharge Orders          Ordered    naproxen (NAPROSYN) 500 MG tablet  2 times daily  12/16/21 1925              Hendricks Limes, PA-C 12/16/21 1925    Godfrey Pick, MD 12/20/21 [id number]

## 2021-12-16 NOTE — Discharge Instructions (Signed)
Please follow-up with your primary care doctor for further evaluation of the CT findings that we discussed today.  I would like for you to return to the emergency department for any worsening symptoms they might have. ?

## 2021-12-16 NOTE — ED Triage Notes (Signed)
Headache onset yesterday, denies nausea or vomiting ?

## 2021-12-17 DIAGNOSIS — M9902 Segmental and somatic dysfunction of thoracic region: Secondary | ICD-10-CM | POA: Diagnosis not present

## 2021-12-17 DIAGNOSIS — M546 Pain in thoracic spine: Secondary | ICD-10-CM | POA: Diagnosis not present

## 2021-12-17 DIAGNOSIS — M4316 Spondylolisthesis, lumbar region: Secondary | ICD-10-CM | POA: Diagnosis not present

## 2021-12-17 DIAGNOSIS — M9905 Segmental and somatic dysfunction of pelvic region: Secondary | ICD-10-CM | POA: Diagnosis not present

## 2021-12-17 DIAGNOSIS — M9903 Segmental and somatic dysfunction of lumbar region: Secondary | ICD-10-CM | POA: Diagnosis not present

## 2021-12-31 DIAGNOSIS — M9905 Segmental and somatic dysfunction of pelvic region: Secondary | ICD-10-CM | POA: Diagnosis not present

## 2021-12-31 DIAGNOSIS — M546 Pain in thoracic spine: Secondary | ICD-10-CM | POA: Diagnosis not present

## 2021-12-31 DIAGNOSIS — M4316 Spondylolisthesis, lumbar region: Secondary | ICD-10-CM | POA: Diagnosis not present

## 2021-12-31 DIAGNOSIS — M9902 Segmental and somatic dysfunction of thoracic region: Secondary | ICD-10-CM | POA: Diagnosis not present

## 2021-12-31 DIAGNOSIS — M9903 Segmental and somatic dysfunction of lumbar region: Secondary | ICD-10-CM | POA: Diagnosis not present

## 2022-01-08 ENCOUNTER — Ambulatory Visit (HOSPITAL_COMMUNITY)
Admission: RE | Admit: 2022-01-08 | Discharge: 2022-01-08 | Disposition: A | Discharge status: Home / Self Care | Payer: Medicare Other | Source: Ambulatory Visit | Attending: Sports Medicine | Admitting: Sports Medicine

## 2022-01-08 DIAGNOSIS — M2578 Osteophyte, vertebrae: Secondary | ICD-10-CM | POA: Diagnosis not present

## 2022-01-08 DIAGNOSIS — M542 Cervicalgia: Secondary | ICD-10-CM | POA: Insufficient documentation

## 2022-01-08 DIAGNOSIS — M47812 Spondylosis without myelopathy or radiculopathy, cervical region: Secondary | ICD-10-CM | POA: Diagnosis not present

## 2022-01-12 ENCOUNTER — Other Ambulatory Visit: Payer: Self-pay | Admitting: "Endocrinology

## 2022-01-12 DIAGNOSIS — M5030 Other cervical disc degeneration, unspecified cervical region: Secondary | ICD-10-CM | POA: Diagnosis not present

## 2022-01-14 DIAGNOSIS — M9905 Segmental and somatic dysfunction of pelvic region: Secondary | ICD-10-CM | POA: Diagnosis not present

## 2022-01-14 DIAGNOSIS — M4316 Spondylolisthesis, lumbar region: Secondary | ICD-10-CM | POA: Diagnosis not present

## 2022-01-14 DIAGNOSIS — M9902 Segmental and somatic dysfunction of thoracic region: Secondary | ICD-10-CM | POA: Diagnosis not present

## 2022-01-14 DIAGNOSIS — M9903 Segmental and somatic dysfunction of lumbar region: Secondary | ICD-10-CM | POA: Diagnosis not present

## 2022-01-14 DIAGNOSIS — M546 Pain in thoracic spine: Secondary | ICD-10-CM | POA: Diagnosis not present

## 2022-01-16 DIAGNOSIS — Z20828 Contact with and (suspected) exposure to other viral communicable diseases: Secondary | ICD-10-CM | POA: Diagnosis not present

## 2022-01-16 DIAGNOSIS — Z1152 Encounter for screening for COVID-19: Secondary | ICD-10-CM | POA: Diagnosis not present

## 2022-01-21 DIAGNOSIS — I10 Essential (primary) hypertension: Secondary | ICD-10-CM | POA: Diagnosis not present

## 2022-01-21 DIAGNOSIS — E1165 Type 2 diabetes mellitus with hyperglycemia: Secondary | ICD-10-CM | POA: Diagnosis not present

## 2022-01-21 DIAGNOSIS — E559 Vitamin D deficiency, unspecified: Secondary | ICD-10-CM | POA: Diagnosis not present

## 2022-01-26 DIAGNOSIS — Z20822 Contact with and (suspected) exposure to covid-19: Secondary | ICD-10-CM | POA: Diagnosis not present

## 2022-01-27 DIAGNOSIS — I739 Peripheral vascular disease, unspecified: Secondary | ICD-10-CM | POA: Diagnosis not present

## 2022-01-27 DIAGNOSIS — Z6833 Body mass index (BMI) 33.0-33.9, adult: Secondary | ICD-10-CM | POA: Diagnosis not present

## 2022-01-27 DIAGNOSIS — I10 Essential (primary) hypertension: Secondary | ICD-10-CM | POA: Diagnosis not present

## 2022-01-27 DIAGNOSIS — M545 Low back pain, unspecified: Secondary | ICD-10-CM | POA: Diagnosis not present

## 2022-01-27 DIAGNOSIS — E6609 Other obesity due to excess calories: Secondary | ICD-10-CM | POA: Diagnosis not present

## 2022-01-27 DIAGNOSIS — E782 Mixed hyperlipidemia: Secondary | ICD-10-CM | POA: Diagnosis not present

## 2022-01-27 DIAGNOSIS — Z91A4 Caregiver's other noncompliance with patient's medication regimen: Secondary | ICD-10-CM | POA: Diagnosis not present

## 2022-01-27 DIAGNOSIS — K5900 Constipation, unspecified: Secondary | ICD-10-CM | POA: Diagnosis not present

## 2022-01-27 DIAGNOSIS — E1165 Type 2 diabetes mellitus with hyperglycemia: Secondary | ICD-10-CM | POA: Diagnosis not present

## 2022-01-27 DIAGNOSIS — L700 Acne vulgaris: Secondary | ICD-10-CM | POA: Diagnosis not present

## 2022-01-27 DIAGNOSIS — E559 Vitamin D deficiency, unspecified: Secondary | ICD-10-CM | POA: Diagnosis not present

## 2022-01-28 DIAGNOSIS — M4316 Spondylolisthesis, lumbar region: Secondary | ICD-10-CM | POA: Diagnosis not present

## 2022-01-28 DIAGNOSIS — M546 Pain in thoracic spine: Secondary | ICD-10-CM | POA: Diagnosis not present

## 2022-01-28 DIAGNOSIS — M9902 Segmental and somatic dysfunction of thoracic region: Secondary | ICD-10-CM | POA: Diagnosis not present

## 2022-01-28 DIAGNOSIS — M9905 Segmental and somatic dysfunction of pelvic region: Secondary | ICD-10-CM | POA: Diagnosis not present

## 2022-01-28 DIAGNOSIS — M9903 Segmental and somatic dysfunction of lumbar region: Secondary | ICD-10-CM | POA: Diagnosis not present

## 2022-02-05 ENCOUNTER — Other Ambulatory Visit: Payer: Self-pay | Admitting: *Deleted

## 2022-02-05 ENCOUNTER — Other Ambulatory Visit: Payer: Self-pay

## 2022-02-05 DIAGNOSIS — I739 Peripheral vascular disease, unspecified: Secondary | ICD-10-CM

## 2022-02-11 DIAGNOSIS — M9903 Segmental and somatic dysfunction of lumbar region: Secondary | ICD-10-CM | POA: Diagnosis not present

## 2022-02-11 DIAGNOSIS — M9902 Segmental and somatic dysfunction of thoracic region: Secondary | ICD-10-CM | POA: Diagnosis not present

## 2022-02-11 DIAGNOSIS — M4316 Spondylolisthesis, lumbar region: Secondary | ICD-10-CM | POA: Diagnosis not present

## 2022-02-11 DIAGNOSIS — M546 Pain in thoracic spine: Secondary | ICD-10-CM | POA: Diagnosis not present

## 2022-02-11 DIAGNOSIS — M9905 Segmental and somatic dysfunction of pelvic region: Secondary | ICD-10-CM | POA: Diagnosis not present

## 2022-02-19 ENCOUNTER — Ambulatory Visit (HOSPITAL_COMMUNITY)
Admission: RE | Admit: 2022-02-19 | Discharge: 2022-02-19 | Disposition: A | Discharge status: Home / Self Care | Payer: Medicare Other | Source: Ambulatory Visit | Attending: Vascular Surgery | Admitting: Vascular Surgery

## 2022-02-19 ENCOUNTER — Encounter: Payer: Self-pay | Admitting: Vascular Surgery

## 2022-02-19 ENCOUNTER — Ambulatory Visit (INDEPENDENT_AMBULATORY_CARE_PROVIDER_SITE_OTHER): Payer: Medicare Other | Admitting: Vascular Surgery

## 2022-02-19 VITALS — BP 165/77 | HR 70 | Temp 98.2°F | Resp 20 | Ht 61.5 in | Wt 178.0 lb

## 2022-02-19 DIAGNOSIS — I739 Peripheral vascular disease, unspecified: Secondary | ICD-10-CM | POA: Diagnosis not present

## 2022-02-19 NOTE — Progress Notes (Signed)
? ? ?REASON FOR VISIT:  ? ?Follow-up of peripheral arterial disease ? ?MEDICAL ISSUES:  ? ?PERIPHERAL ARTERIAL DISEASE: This patient has evidence of tibial artery occlusive disease bilaterally but is asymptomatic.  Her ABIs are 100% on the right and 70% on the left.  I would only consider a more aggressive work-up if she developed disabling claudication, rest pain, or nonhealing ulcer.  As she is asymptomatic I do not think routine follow-up is indicated unless something changes.  Fortunately she is not a smoker.  I encouraged her to stay as active as possible.  We will see her back as needed. ? ? ?HPI:  ? ?Stacy [name] is a pleasant 66 y.o. female who I saw on 02/12/2021 with peripheral arterial disease.  Based on her exam she had evidence of infrainguinal arterial occlusive disease bilaterally.  However she was having minimal symptoms.  I did not recommend an aggressive approach.  We discussed the importance of exercise.  Fortunately she was not a smoker.  I recommend that she begin taking aspirin daily.  She was on a statin.  I ordered follow-up ABIs in 1 year and she comes in for that follow-up visit. ? ?Since I saw her last, she denies any history of claudication, rest pain, or nonhealing ulcers.  Her only issue has been some back problems.  She fell into a hole in her yard and threw her back out some and this is slowly getting better.  She also complains of some cramps in her legs at night. ? ?She is on a statin and does take aspirin but not routinely. ? ?Past Medical History:  ?Diagnosis Date  ? Cerebral infarction, unspecified ([facility])   ? Chronic back pain   ? Diabetes mellitus   ? Essential (primary) hypertension   ? HTN (hypertension)   ? Hyperlipidemia   ? Lumbago   ? Obesity, unspecified   ? Osteoarthritis   ? Other hyperlipidemia   ? Sciatica, right side   ? Stroke [facility])   ? Sudden visual loss, left eye   ? Type 2 diabetes mellitus with unspecified diabetic retinopathy without macular edema (HCC)   ?  Type 2 diabetes mellitus without complications ([place])   ? ? ?Family History  ?Problem Relation Age of Onset  ? Diabetes Mother   ? Heart disease Mother   ? Hyperlipidemia Mother   ? Hypertension Mother   ? Heart attack Mother   ? Diabetes Father   ? Heart disease Father   ? Hyperlipidemia Father   ? Hypertension Father   ? Heart attack Father   ? Heart disease Brother   ? Liver disease Neg Hx   ? Colon cancer Neg Hx   ? GI problems Neg Hx   ? Stroke Neg Hx   ? ? ?SOCIAL HISTORY: ?Social History  ? ?Tobacco Use  ? Smoking status: Never  ? Smokeless tobacco: Never  ?Substance Use Topics  ? Alcohol use: No  ?  Alcohol/week: 0.0 standard drinks  ? ? ?No Known Allergies ? ?Current Outpatient Medications  ?Medication Sig Dispense Refill  ? amLODipine (NORVASC) 5 MG tablet Take 5 mg by mouth daily.    ? Bioflavonoid Products (BIOFLEX PO) Take by mouth.    ? Cholecalciferol (VITAMIN D3) 125 MCG (5000 UT) CAPS Take 1 capsule (5,000 Units total) by mouth daily. 90 capsule 1  ? cyanocobalamin 1000 MCG tablet Take 3,000 mcg by mouth daily.    ? glucose blood (FREESTYLE TEST STRIPS) test strip USE  TO TEST BLOOD SUGAR FOUR TIMES DAILY AS DIRECTED. 100 each 2  ? Insulin Syringe-Needle U-100 (INSULIN SYRINGE .5CC/31GX5/16") 31G X 5/16" 0.5 ML MISC USE TO INJECT INSULIN TWICE DAILY 100 each 2  ? losartan-hydrochlorothiazide (HYZAAR) 100-25 MG tablet TAKE 1 TABLET BY MOUTH DAILY 90 tablet 0  ? naproxen (NAPROSYN) 500 MG tablet Take 1 tablet (500 mg total) by mouth 2 (two) times daily. 10 tablet 0  ? NOVOLIN 70/30 (70-30) 100 UNIT/ML injection INJECT 40 UNITS UNDER THE SKIN TWICE DAILY WITH A MEAL 80 mL 0  ? rosuvastatin (CRESTOR) 10 MG tablet Take 10 mg by mouth daily.    ? Semaglutide (OZEMPIC, 0.25 OR 0.5 MG/DOSE, [place]) Inject 0.5 mg into the skin once a week.    ? tiZANidine (ZANAFLEX) 4 MG tablet Take 4 mg by mouth at bedtime as needed.    ? vitamin C (ASCORBIC ACID) 500 MG tablet Take 500 mg by mouth daily.    ? vitamin E 180 MG  (400 UNITS) capsule Take 400 Units by mouth daily.    ? ?No current facility-administered medications for this visit.  ? ? ?REVIEW OF SYSTEMS:  ?'[X]'$  denotes positive finding, '[ ]'$  denotes negative finding ?Cardiac  Comments:  ?Chest pain or chest pressure:    ?Shortness of breath upon exertion:    ?Short of breath when lying flat:    ?Irregular heart rhythm:    ?    ?Vascular    ?Pain in calf, thigh, or hip brought on by ambulation:    ?Pain in feet at night that wakes you up from your sleep:     ?Blood clot in your veins:    ?Leg swelling:     ?    ?Pulmonary    ?Oxygen at home:    ?Productive cough:     ?Wheezing:     ?    ?Neurologic    ?Sudden weakness in arms or legs:     ?Sudden numbness in arms or legs:     ?Sudden onset of difficulty speaking or slurred speech:    ?Temporary loss of vision in one eye:     ?Problems with dizziness:     ?    ?Gastrointestinal    ?Blood in stool:     ?Vomited blood:     ?    ?Genitourinary    ?Burning when urinating:     ?Blood in urine:    ?    ?Psychiatric    ?Major depression:     ?    ?Hematologic    ?Bleeding problems:    ?Problems with blood clotting too easily:    ?    ?Skin    ?Rashes or ulcers:    ?    ?Constitutional    ?Fever or chills:    ? ?PHYSICAL EXAM:  ? ?Vitals:  ? 02/19/22 1327  ?BP: (!) 165/77  ?Pulse: 70  ?Resp: 20  ?Temp: 98.2 ?F (36.8 ?C)  ?SpO2: 97%  ?Weight: 178 lb (80.7 kg)  ?Height: 5' 1.5" (1.562 m)  ? ? ?GENERAL: The patient is a well-nourished female, in no acute distress. The vital signs are documented above. ?CARDIAC: There is a regular rate and rhythm.  ?VASCULAR: I do not detect carotid bruits. ?She has normal femoral pulses. ?She has a palpable left popliteal pulse and a diminished right popliteal pulse.  I cannot palpate pedal pulses.  Both feet are warm and adequately perfused. ?PULMONARY: There is good air exchange bilaterally without wheezing  or rales. ?ABDOMEN: Soft and non-tender with normal pitched bowel sounds.  ?MUSCULOSKELETAL: There  are no major deformities or cyanosis. ?NEUROLOGIC: No focal weakness or paresthesias are detected. ?SKIN: There are no ulcers or rashes noted. ?PSYCHIATRIC: The patient has a normal affect. ? ?DATA:   ? ?ARTERIAL DOPPLER STUDY: I have independently interpreted her arterial Doppler study today. ? ?On the right side there is a biphasic dorsalis pedis and posterior tibial signal.  ABIs 100%.  Toe pressures 111 mmHg. ? ?On the left side there is a monophasic dorsalis pedis and posterior tibial signal.  ABI is 70%.  Toe pressures 111 mmHg. ? ?Deitra Mayo ?[facility] ?Office [phone] ?

## 2022-02-25 DIAGNOSIS — M546 Pain in thoracic spine: Secondary | ICD-10-CM | POA: Diagnosis not present

## 2022-02-25 DIAGNOSIS — M9903 Segmental and somatic dysfunction of lumbar region: Secondary | ICD-10-CM | POA: Diagnosis not present

## 2022-02-25 DIAGNOSIS — M9902 Segmental and somatic dysfunction of thoracic region: Secondary | ICD-10-CM | POA: Diagnosis not present

## 2022-02-25 DIAGNOSIS — M4316 Spondylolisthesis, lumbar region: Secondary | ICD-10-CM | POA: Diagnosis not present

## 2022-02-25 DIAGNOSIS — M9905 Segmental and somatic dysfunction of pelvic region: Secondary | ICD-10-CM | POA: Diagnosis not present

## 2022-03-25 DIAGNOSIS — M542 Cervicalgia: Secondary | ICD-10-CM | POA: Diagnosis not present

## 2022-03-25 DIAGNOSIS — M9901 Segmental and somatic dysfunction of cervical region: Secondary | ICD-10-CM | POA: Diagnosis not present

## 2022-03-25 DIAGNOSIS — M4316 Spondylolisthesis, lumbar region: Secondary | ICD-10-CM | POA: Diagnosis not present

## 2022-03-25 DIAGNOSIS — M9903 Segmental and somatic dysfunction of lumbar region: Secondary | ICD-10-CM | POA: Diagnosis not present

## 2022-03-25 DIAGNOSIS — M546 Pain in thoracic spine: Secondary | ICD-10-CM | POA: Diagnosis not present

## 2022-03-25 DIAGNOSIS — M9902 Segmental and somatic dysfunction of thoracic region: Secondary | ICD-10-CM | POA: Diagnosis not present

## 2022-03-25 DIAGNOSIS — M9905 Segmental and somatic dysfunction of pelvic region: Secondary | ICD-10-CM | POA: Diagnosis not present

## 2022-04-15 DIAGNOSIS — M546 Pain in thoracic spine: Secondary | ICD-10-CM | POA: Diagnosis not present

## 2022-04-15 DIAGNOSIS — M9903 Segmental and somatic dysfunction of lumbar region: Secondary | ICD-10-CM | POA: Diagnosis not present

## 2022-04-15 DIAGNOSIS — M9905 Segmental and somatic dysfunction of pelvic region: Secondary | ICD-10-CM | POA: Diagnosis not present

## 2022-04-15 DIAGNOSIS — M542 Cervicalgia: Secondary | ICD-10-CM | POA: Diagnosis not present

## 2022-04-15 DIAGNOSIS — M4316 Spondylolisthesis, lumbar region: Secondary | ICD-10-CM | POA: Diagnosis not present

## 2022-04-15 DIAGNOSIS — M9901 Segmental and somatic dysfunction of cervical region: Secondary | ICD-10-CM | POA: Diagnosis not present

## 2022-04-15 DIAGNOSIS — M9902 Segmental and somatic dysfunction of thoracic region: Secondary | ICD-10-CM | POA: Diagnosis not present

## 2022-05-12 ENCOUNTER — Other Ambulatory Visit: Payer: Self-pay | Admitting: "Endocrinology

## 2022-05-12 DIAGNOSIS — E1159 Type 2 diabetes mellitus with other circulatory complications: Secondary | ICD-10-CM

## 2022-05-13 DIAGNOSIS — M4316 Spondylolisthesis, lumbar region: Secondary | ICD-10-CM | POA: Diagnosis not present

## 2022-05-13 DIAGNOSIS — M542 Cervicalgia: Secondary | ICD-10-CM | POA: Diagnosis not present

## 2022-05-13 DIAGNOSIS — M9903 Segmental and somatic dysfunction of lumbar region: Secondary | ICD-10-CM | POA: Diagnosis not present

## 2022-05-13 DIAGNOSIS — M9905 Segmental and somatic dysfunction of pelvic region: Secondary | ICD-10-CM | POA: Diagnosis not present

## 2022-05-13 DIAGNOSIS — M546 Pain in thoracic spine: Secondary | ICD-10-CM | POA: Diagnosis not present

## 2022-05-13 DIAGNOSIS — M9902 Segmental and somatic dysfunction of thoracic region: Secondary | ICD-10-CM | POA: Diagnosis not present

## 2022-05-13 DIAGNOSIS — M9901 Segmental and somatic dysfunction of cervical region: Secondary | ICD-10-CM | POA: Diagnosis not present

## 2022-05-27 DIAGNOSIS — H2513 Age-related nuclear cataract, bilateral: Secondary | ICD-10-CM | POA: Diagnosis not present

## 2022-05-27 DIAGNOSIS — Z794 Long term (current) use of insulin: Secondary | ICD-10-CM | POA: Diagnosis not present

## 2022-05-27 DIAGNOSIS — H3582 Retinal ischemia: Secondary | ICD-10-CM | POA: Diagnosis not present

## 2022-05-27 DIAGNOSIS — E113553 Type 2 diabetes mellitus with stable proliferative diabetic retinopathy, bilateral: Secondary | ICD-10-CM | POA: Diagnosis not present

## 2022-05-28 DIAGNOSIS — H43812 Vitreous degeneration, left eye: Secondary | ICD-10-CM | POA: Diagnosis not present

## 2022-05-28 DIAGNOSIS — H35033 Hypertensive retinopathy, bilateral: Secondary | ICD-10-CM | POA: Diagnosis not present

## 2022-05-28 DIAGNOSIS — H43823 Vitreomacular adhesion, bilateral: Secondary | ICD-10-CM | POA: Diagnosis not present

## 2022-05-28 DIAGNOSIS — E113593 Type 2 diabetes mellitus with proliferative diabetic retinopathy without macular edema, bilateral: Secondary | ICD-10-CM | POA: Diagnosis not present

## 2022-06-08 ENCOUNTER — Telehealth: Payer: Self-pay | Admitting: *Deleted

## 2022-06-08 NOTE — Patient Outreach (Signed)
  Care Coordination   Initial Visit Note   06/08/2022 Name: Stacy [name] MRN: [id number] DOB: October 25, 1955  Stacy [name] is a 66 y.o. year old female who sees Nevada Crane, Edwinna Areola, MD for primary care. I spoke with  Stacy [name] by phone today.  What matters to the patients health and wellness today?  No concerns expressed. Patient has annual visit on [id number] . RN discussed services [facility] services, RN, SW, and Pharmacist. Patient declined services.    Goals Addressed   None     SDOH assessments and interventions completed:  Yes     Care Coordination Interventions Activated:  No  Care Coordination Interventions:  No, not indicated   Follow up plan: No further intervention required.   Encounter Outcome:  Pt. Visit Completed

## 2022-06-10 DIAGNOSIS — M9901 Segmental and somatic dysfunction of cervical region: Secondary | ICD-10-CM | POA: Diagnosis not present

## 2022-06-10 DIAGNOSIS — M9903 Segmental and somatic dysfunction of lumbar region: Secondary | ICD-10-CM | POA: Diagnosis not present

## 2022-06-10 DIAGNOSIS — M4316 Spondylolisthesis, lumbar region: Secondary | ICD-10-CM | POA: Diagnosis not present

## 2022-06-10 DIAGNOSIS — M546 Pain in thoracic spine: Secondary | ICD-10-CM | POA: Diagnosis not present

## 2022-06-10 DIAGNOSIS — M542 Cervicalgia: Secondary | ICD-10-CM | POA: Diagnosis not present

## 2022-06-10 DIAGNOSIS — M9902 Segmental and somatic dysfunction of thoracic region: Secondary | ICD-10-CM | POA: Diagnosis not present

## 2022-06-10 DIAGNOSIS — M9905 Segmental and somatic dysfunction of pelvic region: Secondary | ICD-10-CM | POA: Diagnosis not present

## 2022-06-11 DIAGNOSIS — E559 Vitamin D deficiency, unspecified: Secondary | ICD-10-CM | POA: Diagnosis not present

## 2022-06-11 DIAGNOSIS — E1165 Type 2 diabetes mellitus with hyperglycemia: Secondary | ICD-10-CM | POA: Diagnosis not present

## 2022-06-11 DIAGNOSIS — I10 Essential (primary) hypertension: Secondary | ICD-10-CM | POA: Diagnosis not present

## 2022-06-16 DIAGNOSIS — I739 Peripheral vascular disease, unspecified: Secondary | ICD-10-CM | POA: Diagnosis not present

## 2022-06-16 DIAGNOSIS — M545 Low back pain, unspecified: Secondary | ICD-10-CM | POA: Diagnosis not present

## 2022-06-16 DIAGNOSIS — I10 Essential (primary) hypertension: Secondary | ICD-10-CM | POA: Diagnosis not present

## 2022-06-16 DIAGNOSIS — K5900 Constipation, unspecified: Secondary | ICD-10-CM | POA: Diagnosis not present

## 2022-06-16 DIAGNOSIS — E1165 Type 2 diabetes mellitus with hyperglycemia: Secondary | ICD-10-CM | POA: Diagnosis not present

## 2022-06-16 DIAGNOSIS — Z6833 Body mass index (BMI) 33.0-33.9, adult: Secondary | ICD-10-CM | POA: Diagnosis not present

## 2022-06-16 DIAGNOSIS — E6609 Other obesity due to excess calories: Secondary | ICD-10-CM | POA: Diagnosis not present

## 2022-06-16 DIAGNOSIS — Z91A4 Caregiver's other noncompliance with patient's medication regimen: Secondary | ICD-10-CM | POA: Diagnosis not present

## 2022-06-16 DIAGNOSIS — M542 Cervicalgia: Secondary | ICD-10-CM | POA: Diagnosis not present

## 2022-06-16 DIAGNOSIS — E782 Mixed hyperlipidemia: Secondary | ICD-10-CM | POA: Diagnosis not present

## 2022-06-16 DIAGNOSIS — E559 Vitamin D deficiency, unspecified: Secondary | ICD-10-CM | POA: Diagnosis not present

## 2022-06-16 DIAGNOSIS — L700 Acne vulgaris: Secondary | ICD-10-CM | POA: Diagnosis not present

## 2022-07-08 DIAGNOSIS — M9902 Segmental and somatic dysfunction of thoracic region: Secondary | ICD-10-CM | POA: Diagnosis not present

## 2022-07-08 DIAGNOSIS — M9905 Segmental and somatic dysfunction of pelvic region: Secondary | ICD-10-CM | POA: Diagnosis not present

## 2022-07-08 DIAGNOSIS — M4316 Spondylolisthesis, lumbar region: Secondary | ICD-10-CM | POA: Diagnosis not present

## 2022-07-08 DIAGNOSIS — M9901 Segmental and somatic dysfunction of cervical region: Secondary | ICD-10-CM | POA: Diagnosis not present

## 2022-07-08 DIAGNOSIS — M542 Cervicalgia: Secondary | ICD-10-CM | POA: Diagnosis not present

## 2022-07-08 DIAGNOSIS — M546 Pain in thoracic spine: Secondary | ICD-10-CM | POA: Diagnosis not present

## 2022-07-08 DIAGNOSIS — M9903 Segmental and somatic dysfunction of lumbar region: Secondary | ICD-10-CM | POA: Diagnosis not present

## 2022-08-07 DIAGNOSIS — M9903 Segmental and somatic dysfunction of lumbar region: Secondary | ICD-10-CM | POA: Diagnosis not present

## 2022-08-07 DIAGNOSIS — M9905 Segmental and somatic dysfunction of pelvic region: Secondary | ICD-10-CM | POA: Diagnosis not present

## 2022-08-07 DIAGNOSIS — M4316 Spondylolisthesis, lumbar region: Secondary | ICD-10-CM | POA: Diagnosis not present

## 2022-08-07 DIAGNOSIS — M542 Cervicalgia: Secondary | ICD-10-CM | POA: Diagnosis not present

## 2022-08-07 DIAGNOSIS — M9902 Segmental and somatic dysfunction of thoracic region: Secondary | ICD-10-CM | POA: Diagnosis not present

## 2022-08-07 DIAGNOSIS — M546 Pain in thoracic spine: Secondary | ICD-10-CM | POA: Diagnosis not present

## 2022-08-07 DIAGNOSIS — M9901 Segmental and somatic dysfunction of cervical region: Secondary | ICD-10-CM | POA: Diagnosis not present

## 2022-08-28 DIAGNOSIS — M25562 Pain in left knee: Secondary | ICD-10-CM | POA: Diagnosis not present

## 2022-08-28 DIAGNOSIS — M9903 Segmental and somatic dysfunction of lumbar region: Secondary | ICD-10-CM | POA: Diagnosis not present

## 2022-08-28 DIAGNOSIS — M9902 Segmental and somatic dysfunction of thoracic region: Secondary | ICD-10-CM | POA: Diagnosis not present

## 2022-08-28 DIAGNOSIS — M542 Cervicalgia: Secondary | ICD-10-CM | POA: Diagnosis not present

## 2022-08-28 DIAGNOSIS — M9905 Segmental and somatic dysfunction of pelvic region: Secondary | ICD-10-CM | POA: Diagnosis not present

## 2022-08-28 DIAGNOSIS — M546 Pain in thoracic spine: Secondary | ICD-10-CM | POA: Diagnosis not present

## 2022-08-28 DIAGNOSIS — M9901 Segmental and somatic dysfunction of cervical region: Secondary | ICD-10-CM | POA: Diagnosis not present

## 2022-08-28 DIAGNOSIS — M4316 Spondylolisthesis, lumbar region: Secondary | ICD-10-CM | POA: Diagnosis not present

## 2022-08-28 DIAGNOSIS — M9906 Segmental and somatic dysfunction of lower extremity: Secondary | ICD-10-CM | POA: Diagnosis not present

## 2022-09-01 DIAGNOSIS — M542 Cervicalgia: Secondary | ICD-10-CM | POA: Diagnosis not present

## 2022-09-01 DIAGNOSIS — M9902 Segmental and somatic dysfunction of thoracic region: Secondary | ICD-10-CM | POA: Diagnosis not present

## 2022-09-01 DIAGNOSIS — M9903 Segmental and somatic dysfunction of lumbar region: Secondary | ICD-10-CM | POA: Diagnosis not present

## 2022-09-01 DIAGNOSIS — M25562 Pain in left knee: Secondary | ICD-10-CM | POA: Diagnosis not present

## 2022-09-01 DIAGNOSIS — M9905 Segmental and somatic dysfunction of pelvic region: Secondary | ICD-10-CM | POA: Diagnosis not present

## 2022-09-01 DIAGNOSIS — M9901 Segmental and somatic dysfunction of cervical region: Secondary | ICD-10-CM | POA: Diagnosis not present

## 2022-09-01 DIAGNOSIS — M9906 Segmental and somatic dysfunction of lower extremity: Secondary | ICD-10-CM | POA: Diagnosis not present

## 2022-09-01 DIAGNOSIS — M4316 Spondylolisthesis, lumbar region: Secondary | ICD-10-CM | POA: Diagnosis not present

## 2022-09-01 DIAGNOSIS — M546 Pain in thoracic spine: Secondary | ICD-10-CM | POA: Diagnosis not present

## 2022-09-14 DIAGNOSIS — M1712 Unilateral primary osteoarthritis, left knee: Secondary | ICD-10-CM | POA: Diagnosis not present

## 2022-09-17 DIAGNOSIS — I10 Essential (primary) hypertension: Secondary | ICD-10-CM | POA: Diagnosis not present

## 2022-09-17 DIAGNOSIS — E1165 Type 2 diabetes mellitus with hyperglycemia: Secondary | ICD-10-CM | POA: Diagnosis not present

## 2022-09-17 DIAGNOSIS — E559 Vitamin D deficiency, unspecified: Secondary | ICD-10-CM | POA: Diagnosis not present

## 2022-09-23 DIAGNOSIS — M542 Cervicalgia: Secondary | ICD-10-CM | POA: Diagnosis not present

## 2022-09-23 DIAGNOSIS — E6609 Other obesity due to excess calories: Secondary | ICD-10-CM | POA: Diagnosis not present

## 2022-09-23 DIAGNOSIS — M545 Low back pain, unspecified: Secondary | ICD-10-CM | POA: Diagnosis not present

## 2022-09-23 DIAGNOSIS — I739 Peripheral vascular disease, unspecified: Secondary | ICD-10-CM | POA: Diagnosis not present

## 2022-09-23 DIAGNOSIS — Z91148 Patient's other noncompliance with medication regimen for other reason: Secondary | ICD-10-CM | POA: Diagnosis not present

## 2022-09-23 DIAGNOSIS — J069 Acute upper respiratory infection, unspecified: Secondary | ICD-10-CM | POA: Diagnosis not present

## 2022-09-23 DIAGNOSIS — E782 Mixed hyperlipidemia: Secondary | ICD-10-CM | POA: Diagnosis not present

## 2022-09-23 DIAGNOSIS — Z6833 Body mass index (BMI) 33.0-33.9, adult: Secondary | ICD-10-CM | POA: Diagnosis not present

## 2022-09-23 DIAGNOSIS — I10 Essential (primary) hypertension: Secondary | ICD-10-CM | POA: Diagnosis not present

## 2022-09-23 DIAGNOSIS — E559 Vitamin D deficiency, unspecified: Secondary | ICD-10-CM | POA: Diagnosis not present

## 2022-09-23 DIAGNOSIS — K5900 Constipation, unspecified: Secondary | ICD-10-CM | POA: Diagnosis not present

## 2022-09-23 DIAGNOSIS — E1165 Type 2 diabetes mellitus with hyperglycemia: Secondary | ICD-10-CM | POA: Diagnosis not present

## 2022-09-30 DIAGNOSIS — M9902 Segmental and somatic dysfunction of thoracic region: Secondary | ICD-10-CM | POA: Diagnosis not present

## 2022-09-30 DIAGNOSIS — M4316 Spondylolisthesis, lumbar region: Secondary | ICD-10-CM | POA: Diagnosis not present

## 2022-09-30 DIAGNOSIS — M9903 Segmental and somatic dysfunction of lumbar region: Secondary | ICD-10-CM | POA: Diagnosis not present

## 2022-09-30 DIAGNOSIS — M546 Pain in thoracic spine: Secondary | ICD-10-CM | POA: Diagnosis not present

## 2022-09-30 DIAGNOSIS — M9905 Segmental and somatic dysfunction of pelvic region: Secondary | ICD-10-CM | POA: Diagnosis not present

## 2022-09-30 DIAGNOSIS — M25562 Pain in left knee: Secondary | ICD-10-CM | POA: Diagnosis not present

## 2022-09-30 DIAGNOSIS — M9906 Segmental and somatic dysfunction of lower extremity: Secondary | ICD-10-CM | POA: Diagnosis not present

## 2022-09-30 DIAGNOSIS — M9901 Segmental and somatic dysfunction of cervical region: Secondary | ICD-10-CM | POA: Diagnosis not present

## 2022-09-30 DIAGNOSIS — M542 Cervicalgia: Secondary | ICD-10-CM | POA: Diagnosis not present

## 2022-10-28 DIAGNOSIS — M546 Pain in thoracic spine: Secondary | ICD-10-CM | POA: Diagnosis not present

## 2022-10-28 DIAGNOSIS — M4316 Spondylolisthesis, lumbar region: Secondary | ICD-10-CM | POA: Diagnosis not present

## 2022-10-28 DIAGNOSIS — M9905 Segmental and somatic dysfunction of pelvic region: Secondary | ICD-10-CM | POA: Diagnosis not present

## 2022-10-28 DIAGNOSIS — M9902 Segmental and somatic dysfunction of thoracic region: Secondary | ICD-10-CM | POA: Diagnosis not present

## 2022-10-28 DIAGNOSIS — M9901 Segmental and somatic dysfunction of cervical region: Secondary | ICD-10-CM | POA: Diagnosis not present

## 2022-10-28 DIAGNOSIS — M9903 Segmental and somatic dysfunction of lumbar region: Secondary | ICD-10-CM | POA: Diagnosis not present

## 2022-10-28 DIAGNOSIS — M542 Cervicalgia: Secondary | ICD-10-CM | POA: Diagnosis not present

## 2022-12-02 DIAGNOSIS — M4316 Spondylolisthesis, lumbar region: Secondary | ICD-10-CM | POA: Diagnosis not present

## 2022-12-02 DIAGNOSIS — M546 Pain in thoracic spine: Secondary | ICD-10-CM | POA: Diagnosis not present

## 2022-12-02 DIAGNOSIS — M9902 Segmental and somatic dysfunction of thoracic region: Secondary | ICD-10-CM | POA: Diagnosis not present

## 2022-12-02 DIAGNOSIS — M9901 Segmental and somatic dysfunction of cervical region: Secondary | ICD-10-CM | POA: Diagnosis not present

## 2022-12-02 DIAGNOSIS — M9903 Segmental and somatic dysfunction of lumbar region: Secondary | ICD-10-CM | POA: Diagnosis not present

## 2022-12-02 DIAGNOSIS — M9905 Segmental and somatic dysfunction of pelvic region: Secondary | ICD-10-CM | POA: Diagnosis not present

## 2022-12-02 DIAGNOSIS — M542 Cervicalgia: Secondary | ICD-10-CM | POA: Diagnosis not present

## 2022-12-07 DIAGNOSIS — H43823 Vitreomacular adhesion, bilateral: Secondary | ICD-10-CM | POA: Diagnosis not present

## 2022-12-07 DIAGNOSIS — H35033 Hypertensive retinopathy, bilateral: Secondary | ICD-10-CM | POA: Diagnosis not present

## 2022-12-07 DIAGNOSIS — H43812 Vitreous degeneration, left eye: Secondary | ICD-10-CM | POA: Diagnosis not present

## 2022-12-07 DIAGNOSIS — H3563 Retinal hemorrhage, bilateral: Secondary | ICD-10-CM | POA: Diagnosis not present

## 2022-12-07 DIAGNOSIS — E113593 Type 2 diabetes mellitus with proliferative diabetic retinopathy without macular edema, bilateral: Secondary | ICD-10-CM | POA: Diagnosis not present

## 2022-12-07 DIAGNOSIS — H25813 Combined forms of age-related cataract, bilateral: Secondary | ICD-10-CM | POA: Diagnosis not present

## 2022-12-30 DIAGNOSIS — E559 Vitamin D deficiency, unspecified: Secondary | ICD-10-CM | POA: Diagnosis not present

## 2022-12-30 DIAGNOSIS — M9903 Segmental and somatic dysfunction of lumbar region: Secondary | ICD-10-CM | POA: Diagnosis not present

## 2022-12-30 DIAGNOSIS — M546 Pain in thoracic spine: Secondary | ICD-10-CM | POA: Diagnosis not present

## 2022-12-30 DIAGNOSIS — I10 Essential (primary) hypertension: Secondary | ICD-10-CM | POA: Diagnosis not present

## 2022-12-30 DIAGNOSIS — M9905 Segmental and somatic dysfunction of pelvic region: Secondary | ICD-10-CM | POA: Diagnosis not present

## 2022-12-30 DIAGNOSIS — M542 Cervicalgia: Secondary | ICD-10-CM | POA: Diagnosis not present

## 2022-12-30 DIAGNOSIS — E1165 Type 2 diabetes mellitus with hyperglycemia: Secondary | ICD-10-CM | POA: Diagnosis not present

## 2022-12-30 DIAGNOSIS — M9901 Segmental and somatic dysfunction of cervical region: Secondary | ICD-10-CM | POA: Diagnosis not present

## 2022-12-30 DIAGNOSIS — M9902 Segmental and somatic dysfunction of thoracic region: Secondary | ICD-10-CM | POA: Diagnosis not present

## 2022-12-30 DIAGNOSIS — M4316 Spondylolisthesis, lumbar region: Secondary | ICD-10-CM | POA: Diagnosis not present

## 2023-01-05 DIAGNOSIS — M545 Low back pain, unspecified: Secondary | ICD-10-CM | POA: Diagnosis not present

## 2023-01-05 DIAGNOSIS — K5900 Constipation, unspecified: Secondary | ICD-10-CM | POA: Diagnosis not present

## 2023-01-05 DIAGNOSIS — E119 Type 2 diabetes mellitus without complications: Secondary | ICD-10-CM | POA: Diagnosis not present

## 2023-01-05 DIAGNOSIS — E1165 Type 2 diabetes mellitus with hyperglycemia: Secondary | ICD-10-CM | POA: Diagnosis not present

## 2023-01-05 DIAGNOSIS — E559 Vitamin D deficiency, unspecified: Secondary | ICD-10-CM | POA: Diagnosis not present

## 2023-01-05 DIAGNOSIS — I10 Essential (primary) hypertension: Secondary | ICD-10-CM | POA: Diagnosis not present

## 2023-01-05 DIAGNOSIS — E782 Mixed hyperlipidemia: Secondary | ICD-10-CM | POA: Diagnosis not present

## 2023-01-05 DIAGNOSIS — G8929 Other chronic pain: Secondary | ICD-10-CM | POA: Diagnosis not present

## 2023-01-05 DIAGNOSIS — I739 Peripheral vascular disease, unspecified: Secondary | ICD-10-CM | POA: Diagnosis not present

## 2023-01-05 DIAGNOSIS — M542 Cervicalgia: Secondary | ICD-10-CM | POA: Diagnosis not present

## 2023-01-05 DIAGNOSIS — E669 Obesity, unspecified: Secondary | ICD-10-CM | POA: Diagnosis not present

## 2023-01-05 DIAGNOSIS — L659 Nonscarring hair loss, unspecified: Secondary | ICD-10-CM | POA: Diagnosis not present

## 2023-01-27 DIAGNOSIS — M546 Pain in thoracic spine: Secondary | ICD-10-CM | POA: Diagnosis not present

## 2023-01-27 DIAGNOSIS — M9902 Segmental and somatic dysfunction of thoracic region: Secondary | ICD-10-CM | POA: Diagnosis not present

## 2023-01-27 DIAGNOSIS — M4316 Spondylolisthesis, lumbar region: Secondary | ICD-10-CM | POA: Diagnosis not present

## 2023-01-27 DIAGNOSIS — M9905 Segmental and somatic dysfunction of pelvic region: Secondary | ICD-10-CM | POA: Diagnosis not present

## 2023-01-27 DIAGNOSIS — M9903 Segmental and somatic dysfunction of lumbar region: Secondary | ICD-10-CM | POA: Diagnosis not present

## 2023-01-27 DIAGNOSIS — M542 Cervicalgia: Secondary | ICD-10-CM | POA: Diagnosis not present

## 2023-01-27 DIAGNOSIS — M9901 Segmental and somatic dysfunction of cervical region: Secondary | ICD-10-CM | POA: Diagnosis not present

## 2023-02-11 DIAGNOSIS — H3563 Retinal hemorrhage, bilateral: Secondary | ICD-10-CM | POA: Diagnosis not present

## 2023-02-11 DIAGNOSIS — H43812 Vitreous degeneration, left eye: Secondary | ICD-10-CM | POA: Diagnosis not present

## 2023-02-11 DIAGNOSIS — E113593 Type 2 diabetes mellitus with proliferative diabetic retinopathy without macular edema, bilateral: Secondary | ICD-10-CM | POA: Diagnosis not present

## 2023-02-11 DIAGNOSIS — H25813 Combined forms of age-related cataract, bilateral: Secondary | ICD-10-CM | POA: Diagnosis not present

## 2023-02-11 DIAGNOSIS — H35033 Hypertensive retinopathy, bilateral: Secondary | ICD-10-CM | POA: Diagnosis not present

## 2023-02-11 DIAGNOSIS — H43823 Vitreomacular adhesion, bilateral: Secondary | ICD-10-CM | POA: Diagnosis not present

## 2023-02-24 DIAGNOSIS — M4316 Spondylolisthesis, lumbar region: Secondary | ICD-10-CM | POA: Diagnosis not present

## 2023-02-24 DIAGNOSIS — M9903 Segmental and somatic dysfunction of lumbar region: Secondary | ICD-10-CM | POA: Diagnosis not present

## 2023-02-24 DIAGNOSIS — M542 Cervicalgia: Secondary | ICD-10-CM | POA: Diagnosis not present

## 2023-02-24 DIAGNOSIS — M546 Pain in thoracic spine: Secondary | ICD-10-CM | POA: Diagnosis not present

## 2023-02-24 DIAGNOSIS — M9902 Segmental and somatic dysfunction of thoracic region: Secondary | ICD-10-CM | POA: Diagnosis not present

## 2023-02-24 DIAGNOSIS — M9901 Segmental and somatic dysfunction of cervical region: Secondary | ICD-10-CM | POA: Diagnosis not present

## 2023-02-24 DIAGNOSIS — M9905 Segmental and somatic dysfunction of pelvic region: Secondary | ICD-10-CM | POA: Diagnosis not present

## 2023-03-16 DIAGNOSIS — H9202 Otalgia, left ear: Secondary | ICD-10-CM | POA: Diagnosis not present

## 2023-03-17 DIAGNOSIS — E113553 Type 2 diabetes mellitus with stable proliferative diabetic retinopathy, bilateral: Secondary | ICD-10-CM | POA: Diagnosis not present

## 2023-03-17 DIAGNOSIS — Z9889 Other specified postprocedural states: Secondary | ICD-10-CM | POA: Diagnosis not present

## 2023-03-17 DIAGNOSIS — Z794 Long term (current) use of insulin: Secondary | ICD-10-CM | POA: Diagnosis not present

## 2023-03-17 DIAGNOSIS — H2513 Age-related nuclear cataract, bilateral: Secondary | ICD-10-CM | POA: Diagnosis not present

## 2023-03-17 DIAGNOSIS — H3582 Retinal ischemia: Secondary | ICD-10-CM | POA: Diagnosis not present

## 2023-03-25 DIAGNOSIS — M9905 Segmental and somatic dysfunction of pelvic region: Secondary | ICD-10-CM | POA: Diagnosis not present

## 2023-03-25 DIAGNOSIS — M542 Cervicalgia: Secondary | ICD-10-CM | POA: Diagnosis not present

## 2023-03-25 DIAGNOSIS — M9903 Segmental and somatic dysfunction of lumbar region: Secondary | ICD-10-CM | POA: Diagnosis not present

## 2023-03-25 DIAGNOSIS — M9902 Segmental and somatic dysfunction of thoracic region: Secondary | ICD-10-CM | POA: Diagnosis not present

## 2023-03-25 DIAGNOSIS — M546 Pain in thoracic spine: Secondary | ICD-10-CM | POA: Diagnosis not present

## 2023-03-25 DIAGNOSIS — M4316 Spondylolisthesis, lumbar region: Secondary | ICD-10-CM | POA: Diagnosis not present

## 2023-03-25 DIAGNOSIS — M9901 Segmental and somatic dysfunction of cervical region: Secondary | ICD-10-CM | POA: Diagnosis not present

## 2023-04-21 DIAGNOSIS — M9901 Segmental and somatic dysfunction of cervical region: Secondary | ICD-10-CM | POA: Diagnosis not present

## 2023-04-21 DIAGNOSIS — M546 Pain in thoracic spine: Secondary | ICD-10-CM | POA: Diagnosis not present

## 2023-04-21 DIAGNOSIS — M9903 Segmental and somatic dysfunction of lumbar region: Secondary | ICD-10-CM | POA: Diagnosis not present

## 2023-04-21 DIAGNOSIS — M542 Cervicalgia: Secondary | ICD-10-CM | POA: Diagnosis not present

## 2023-04-21 DIAGNOSIS — M9905 Segmental and somatic dysfunction of pelvic region: Secondary | ICD-10-CM | POA: Diagnosis not present

## 2023-04-21 DIAGNOSIS — M9902 Segmental and somatic dysfunction of thoracic region: Secondary | ICD-10-CM | POA: Diagnosis not present

## 2023-04-21 DIAGNOSIS — M4316 Spondylolisthesis, lumbar region: Secondary | ICD-10-CM | POA: Diagnosis not present

## 2023-05-04 DIAGNOSIS — H2512 Age-related nuclear cataract, left eye: Secondary | ICD-10-CM | POA: Diagnosis not present

## 2023-05-04 DIAGNOSIS — H268 Other specified cataract: Secondary | ICD-10-CM | POA: Diagnosis not present

## 2023-05-26 DIAGNOSIS — M9903 Segmental and somatic dysfunction of lumbar region: Secondary | ICD-10-CM | POA: Diagnosis not present

## 2023-05-26 DIAGNOSIS — M546 Pain in thoracic spine: Secondary | ICD-10-CM | POA: Diagnosis not present

## 2023-05-26 DIAGNOSIS — M4316 Spondylolisthesis, lumbar region: Secondary | ICD-10-CM | POA: Diagnosis not present

## 2023-05-26 DIAGNOSIS — M542 Cervicalgia: Secondary | ICD-10-CM | POA: Diagnosis not present

## 2023-05-26 DIAGNOSIS — M9901 Segmental and somatic dysfunction of cervical region: Secondary | ICD-10-CM | POA: Diagnosis not present

## 2023-05-26 DIAGNOSIS — M9902 Segmental and somatic dysfunction of thoracic region: Secondary | ICD-10-CM | POA: Diagnosis not present

## 2023-05-26 DIAGNOSIS — M9905 Segmental and somatic dysfunction of pelvic region: Secondary | ICD-10-CM | POA: Diagnosis not present

## 2023-06-14 DIAGNOSIS — H2511 Age-related nuclear cataract, right eye: Secondary | ICD-10-CM | POA: Diagnosis not present

## 2023-06-17 DIAGNOSIS — H2511 Age-related nuclear cataract, right eye: Secondary | ICD-10-CM | POA: Diagnosis not present

## 2023-06-23 DIAGNOSIS — M4316 Spondylolisthesis, lumbar region: Secondary | ICD-10-CM | POA: Diagnosis not present

## 2023-06-23 DIAGNOSIS — M9905 Segmental and somatic dysfunction of pelvic region: Secondary | ICD-10-CM | POA: Diagnosis not present

## 2023-06-23 DIAGNOSIS — M9903 Segmental and somatic dysfunction of lumbar region: Secondary | ICD-10-CM | POA: Diagnosis not present

## 2023-06-23 DIAGNOSIS — M9901 Segmental and somatic dysfunction of cervical region: Secondary | ICD-10-CM | POA: Diagnosis not present

## 2023-06-23 DIAGNOSIS — M542 Cervicalgia: Secondary | ICD-10-CM | POA: Diagnosis not present

## 2023-06-23 DIAGNOSIS — M546 Pain in thoracic spine: Secondary | ICD-10-CM | POA: Diagnosis not present

## 2023-06-23 DIAGNOSIS — M9902 Segmental and somatic dysfunction of thoracic region: Secondary | ICD-10-CM | POA: Diagnosis not present

## 2023-07-02 DIAGNOSIS — E559 Vitamin D deficiency, unspecified: Secondary | ICD-10-CM | POA: Diagnosis not present

## 2023-07-02 DIAGNOSIS — I10 Essential (primary) hypertension: Secondary | ICD-10-CM | POA: Diagnosis not present

## 2023-07-02 DIAGNOSIS — E1165 Type 2 diabetes mellitus with hyperglycemia: Secondary | ICD-10-CM | POA: Diagnosis not present

## 2023-07-09 ENCOUNTER — Other Ambulatory Visit (HOSPITAL_COMMUNITY): Payer: Self-pay | Admitting: Family Medicine

## 2023-07-09 DIAGNOSIS — Z713 Dietary counseling and surveillance: Secondary | ICD-10-CM | POA: Diagnosis not present

## 2023-07-09 DIAGNOSIS — Z1231 Encounter for screening mammogram for malignant neoplasm of breast: Secondary | ICD-10-CM

## 2023-07-09 DIAGNOSIS — Z7182 Exercise counseling: Secondary | ICD-10-CM | POA: Diagnosis not present

## 2023-07-09 DIAGNOSIS — M519 Unspecified thoracic, thoracolumbar and lumbosacral intervertebral disc disorder: Secondary | ICD-10-CM | POA: Diagnosis not present

## 2023-07-09 DIAGNOSIS — E1165 Type 2 diabetes mellitus with hyperglycemia: Secondary | ICD-10-CM | POA: Diagnosis not present

## 2023-07-09 DIAGNOSIS — E782 Mixed hyperlipidemia: Secondary | ICD-10-CM | POA: Diagnosis not present

## 2023-07-09 DIAGNOSIS — I10 Essential (primary) hypertension: Secondary | ICD-10-CM | POA: Diagnosis not present

## 2023-07-09 DIAGNOSIS — I739 Peripheral vascular disease, unspecified: Secondary | ICD-10-CM | POA: Diagnosis not present

## 2023-07-09 DIAGNOSIS — E559 Vitamin D deficiency, unspecified: Secondary | ICD-10-CM | POA: Diagnosis not present

## 2023-07-09 DIAGNOSIS — Z6831 Body mass index (BMI) 31.0-31.9, adult: Secondary | ICD-10-CM | POA: Diagnosis not present

## 2023-07-09 DIAGNOSIS — K5900 Constipation, unspecified: Secondary | ICD-10-CM | POA: Diagnosis not present

## 2023-07-09 DIAGNOSIS — E6609 Other obesity due to excess calories: Secondary | ICD-10-CM | POA: Diagnosis not present

## 2023-07-09 DIAGNOSIS — L659 Nonscarring hair loss, unspecified: Secondary | ICD-10-CM | POA: Diagnosis not present

## 2023-07-14 DIAGNOSIS — L648 Other androgenic alopecia: Secondary | ICD-10-CM | POA: Diagnosis not present

## 2023-07-21 DIAGNOSIS — M9905 Segmental and somatic dysfunction of pelvic region: Secondary | ICD-10-CM | POA: Diagnosis not present

## 2023-07-21 DIAGNOSIS — M9901 Segmental and somatic dysfunction of cervical region: Secondary | ICD-10-CM | POA: Diagnosis not present

## 2023-07-21 DIAGNOSIS — M9902 Segmental and somatic dysfunction of thoracic region: Secondary | ICD-10-CM | POA: Diagnosis not present

## 2023-07-21 DIAGNOSIS — M546 Pain in thoracic spine: Secondary | ICD-10-CM | POA: Diagnosis not present

## 2023-07-21 DIAGNOSIS — M4316 Spondylolisthesis, lumbar region: Secondary | ICD-10-CM | POA: Diagnosis not present

## 2023-07-21 DIAGNOSIS — M9903 Segmental and somatic dysfunction of lumbar region: Secondary | ICD-10-CM | POA: Diagnosis not present

## 2023-07-21 DIAGNOSIS — M542 Cervicalgia: Secondary | ICD-10-CM | POA: Diagnosis not present

## 2023-08-16 DIAGNOSIS — H43823 Vitreomacular adhesion, bilateral: Secondary | ICD-10-CM | POA: Diagnosis not present

## 2023-08-16 DIAGNOSIS — H25813 Combined forms of age-related cataract, bilateral: Secondary | ICD-10-CM | POA: Diagnosis not present

## 2023-08-16 DIAGNOSIS — E113593 Type 2 diabetes mellitus with proliferative diabetic retinopathy without macular edema, bilateral: Secondary | ICD-10-CM | POA: Diagnosis not present

## 2023-08-16 DIAGNOSIS — H3563 Retinal hemorrhage, bilateral: Secondary | ICD-10-CM | POA: Diagnosis not present

## 2023-08-16 DIAGNOSIS — H35033 Hypertensive retinopathy, bilateral: Secondary | ICD-10-CM | POA: Diagnosis not present

## 2023-08-16 DIAGNOSIS — H43812 Vitreous degeneration, left eye: Secondary | ICD-10-CM | POA: Diagnosis not present

## 2023-08-18 DIAGNOSIS — M9905 Segmental and somatic dysfunction of pelvic region: Secondary | ICD-10-CM | POA: Diagnosis not present

## 2023-08-18 DIAGNOSIS — M542 Cervicalgia: Secondary | ICD-10-CM | POA: Diagnosis not present

## 2023-08-18 DIAGNOSIS — M9902 Segmental and somatic dysfunction of thoracic region: Secondary | ICD-10-CM | POA: Diagnosis not present

## 2023-08-18 DIAGNOSIS — M9901 Segmental and somatic dysfunction of cervical region: Secondary | ICD-10-CM | POA: Diagnosis not present

## 2023-08-18 DIAGNOSIS — M4316 Spondylolisthesis, lumbar region: Secondary | ICD-10-CM | POA: Diagnosis not present

## 2023-08-18 DIAGNOSIS — M9903 Segmental and somatic dysfunction of lumbar region: Secondary | ICD-10-CM | POA: Diagnosis not present

## 2023-08-18 DIAGNOSIS — M546 Pain in thoracic spine: Secondary | ICD-10-CM | POA: Diagnosis not present

## 2023-09-15 DIAGNOSIS — M4316 Spondylolisthesis, lumbar region: Secondary | ICD-10-CM | POA: Diagnosis not present

## 2023-09-15 DIAGNOSIS — M9901 Segmental and somatic dysfunction of cervical region: Secondary | ICD-10-CM | POA: Diagnosis not present

## 2023-09-15 DIAGNOSIS — M546 Pain in thoracic spine: Secondary | ICD-10-CM | POA: Diagnosis not present

## 2023-09-15 DIAGNOSIS — M9905 Segmental and somatic dysfunction of pelvic region: Secondary | ICD-10-CM | POA: Diagnosis not present

## 2023-09-15 DIAGNOSIS — M9902 Segmental and somatic dysfunction of thoracic region: Secondary | ICD-10-CM | POA: Diagnosis not present

## 2023-09-15 DIAGNOSIS — M542 Cervicalgia: Secondary | ICD-10-CM | POA: Diagnosis not present

## 2023-09-15 DIAGNOSIS — M9903 Segmental and somatic dysfunction of lumbar region: Secondary | ICD-10-CM | POA: Diagnosis not present

## 2023-10-20 DIAGNOSIS — M9903 Segmental and somatic dysfunction of lumbar region: Secondary | ICD-10-CM | POA: Diagnosis not present

## 2023-10-20 DIAGNOSIS — M542 Cervicalgia: Secondary | ICD-10-CM | POA: Diagnosis not present

## 2023-10-20 DIAGNOSIS — M9905 Segmental and somatic dysfunction of pelvic region: Secondary | ICD-10-CM | POA: Diagnosis not present

## 2023-10-20 DIAGNOSIS — M4316 Spondylolisthesis, lumbar region: Secondary | ICD-10-CM | POA: Diagnosis not present

## 2023-10-20 DIAGNOSIS — M9901 Segmental and somatic dysfunction of cervical region: Secondary | ICD-10-CM | POA: Diagnosis not present

## 2023-10-20 DIAGNOSIS — M9902 Segmental and somatic dysfunction of thoracic region: Secondary | ICD-10-CM | POA: Diagnosis not present

## 2023-10-20 DIAGNOSIS — M546 Pain in thoracic spine: Secondary | ICD-10-CM | POA: Diagnosis not present

## 2023-10-25 DIAGNOSIS — I10 Essential (primary) hypertension: Secondary | ICD-10-CM | POA: Diagnosis not present

## 2023-10-25 DIAGNOSIS — E559 Vitamin D deficiency, unspecified: Secondary | ICD-10-CM | POA: Diagnosis not present

## 2023-10-25 DIAGNOSIS — E1165 Type 2 diabetes mellitus with hyperglycemia: Secondary | ICD-10-CM | POA: Diagnosis not present

## 2023-10-26 DIAGNOSIS — I10 Essential (primary) hypertension: Secondary | ICD-10-CM | POA: Diagnosis not present

## 2023-11-04 ENCOUNTER — Other Ambulatory Visit (HOSPITAL_COMMUNITY): Payer: Self-pay | Admitting: Internal Medicine

## 2023-11-04 DIAGNOSIS — Z1231 Encounter for screening mammogram for malignant neoplasm of breast: Secondary | ICD-10-CM

## 2023-11-08 DIAGNOSIS — E1165 Type 2 diabetes mellitus with hyperglycemia: Secondary | ICD-10-CM | POA: Diagnosis not present

## 2023-11-08 DIAGNOSIS — E559 Vitamin D deficiency, unspecified: Secondary | ICD-10-CM | POA: Diagnosis not present

## 2023-11-08 DIAGNOSIS — E1151 Type 2 diabetes mellitus with diabetic peripheral angiopathy without gangrene: Secondary | ICD-10-CM | POA: Diagnosis not present

## 2023-11-08 DIAGNOSIS — I10 Essential (primary) hypertension: Secondary | ICD-10-CM | POA: Diagnosis not present

## 2023-11-08 DIAGNOSIS — E669 Obesity, unspecified: Secondary | ICD-10-CM | POA: Diagnosis not present

## 2023-11-08 DIAGNOSIS — I739 Peripheral vascular disease, unspecified: Secondary | ICD-10-CM | POA: Diagnosis not present

## 2023-11-08 DIAGNOSIS — Z794 Long term (current) use of insulin: Secondary | ICD-10-CM | POA: Diagnosis not present

## 2023-11-08 DIAGNOSIS — K5900 Constipation, unspecified: Secondary | ICD-10-CM | POA: Diagnosis not present

## 2023-11-08 DIAGNOSIS — Z79899 Other long term (current) drug therapy: Secondary | ICD-10-CM | POA: Diagnosis not present

## 2023-11-08 DIAGNOSIS — L659 Nonscarring hair loss, unspecified: Secondary | ICD-10-CM | POA: Diagnosis not present

## 2023-11-08 DIAGNOSIS — M51369 Other intervertebral disc degeneration, lumbar region without mention of lumbar back pain or lower extremity pain: Secondary | ICD-10-CM | POA: Diagnosis not present

## 2023-11-08 DIAGNOSIS — E782 Mixed hyperlipidemia: Secondary | ICD-10-CM | POA: Diagnosis not present

## 2023-11-17 DIAGNOSIS — M9903 Segmental and somatic dysfunction of lumbar region: Secondary | ICD-10-CM | POA: Diagnosis not present

## 2023-11-17 DIAGNOSIS — M4316 Spondylolisthesis, lumbar region: Secondary | ICD-10-CM | POA: Diagnosis not present

## 2023-11-17 DIAGNOSIS — M546 Pain in thoracic spine: Secondary | ICD-10-CM | POA: Diagnosis not present

## 2023-11-17 DIAGNOSIS — M9905 Segmental and somatic dysfunction of pelvic region: Secondary | ICD-10-CM | POA: Diagnosis not present

## 2023-11-17 DIAGNOSIS — M542 Cervicalgia: Secondary | ICD-10-CM | POA: Diagnosis not present

## 2023-11-17 DIAGNOSIS — M9901 Segmental and somatic dysfunction of cervical region: Secondary | ICD-10-CM | POA: Diagnosis not present

## 2023-11-17 DIAGNOSIS — M9902 Segmental and somatic dysfunction of thoracic region: Secondary | ICD-10-CM | POA: Diagnosis not present

## 2023-11-18 DIAGNOSIS — I10 Essential (primary) hypertension: Secondary | ICD-10-CM | POA: Diagnosis not present

## 2023-11-18 DIAGNOSIS — E1165 Type 2 diabetes mellitus with hyperglycemia: Secondary | ICD-10-CM | POA: Diagnosis not present

## 2023-12-15 DIAGNOSIS — M9905 Segmental and somatic dysfunction of pelvic region: Secondary | ICD-10-CM | POA: Diagnosis not present

## 2023-12-15 DIAGNOSIS — M542 Cervicalgia: Secondary | ICD-10-CM | POA: Diagnosis not present

## 2023-12-15 DIAGNOSIS — M4316 Spondylolisthesis, lumbar region: Secondary | ICD-10-CM | POA: Diagnosis not present

## 2023-12-15 DIAGNOSIS — M9903 Segmental and somatic dysfunction of lumbar region: Secondary | ICD-10-CM | POA: Diagnosis not present

## 2023-12-15 DIAGNOSIS — M546 Pain in thoracic spine: Secondary | ICD-10-CM | POA: Diagnosis not present

## 2023-12-15 DIAGNOSIS — M9902 Segmental and somatic dysfunction of thoracic region: Secondary | ICD-10-CM | POA: Diagnosis not present

## 2023-12-15 DIAGNOSIS — M9901 Segmental and somatic dysfunction of cervical region: Secondary | ICD-10-CM | POA: Diagnosis not present

## 2024-01-12 DIAGNOSIS — M4316 Spondylolisthesis, lumbar region: Secondary | ICD-10-CM | POA: Diagnosis not present

## 2024-01-12 DIAGNOSIS — M546 Pain in thoracic spine: Secondary | ICD-10-CM | POA: Diagnosis not present

## 2024-01-12 DIAGNOSIS — M9902 Segmental and somatic dysfunction of thoracic region: Secondary | ICD-10-CM | POA: Diagnosis not present

## 2024-01-12 DIAGNOSIS — M9901 Segmental and somatic dysfunction of cervical region: Secondary | ICD-10-CM | POA: Diagnosis not present

## 2024-01-12 DIAGNOSIS — M542 Cervicalgia: Secondary | ICD-10-CM | POA: Diagnosis not present

## 2024-01-12 DIAGNOSIS — M9905 Segmental and somatic dysfunction of pelvic region: Secondary | ICD-10-CM | POA: Diagnosis not present

## 2024-01-12 DIAGNOSIS — M9903 Segmental and somatic dysfunction of lumbar region: Secondary | ICD-10-CM | POA: Diagnosis not present

## 2024-01-25 ENCOUNTER — Telehealth: Payer: Self-pay

## 2024-01-25 NOTE — Progress Notes (Signed)
   01/25/2024  Patient ID: Stacy [name], female   DOB: [birth year], 68 y.o.   MRN: [id number]  Visit Scheduling  Called [name] to set up a visit to discuss diabetes an lipid management. Outreach unsuccessful, left a voicemail to return call. If not return call will coordinate with PCP to set up a meeting after her next office visit on 02/11/24 @ 11:20 am.   Flint Hummer, PharmD

## 2024-02-02 DIAGNOSIS — E113553 Type 2 diabetes mellitus with stable proliferative diabetic retinopathy, bilateral: Secondary | ICD-10-CM | POA: Diagnosis not present

## 2024-02-02 DIAGNOSIS — Z794 Long term (current) use of insulin: Secondary | ICD-10-CM | POA: Diagnosis not present

## 2024-02-02 DIAGNOSIS — Z961 Presence of intraocular lens: Secondary | ICD-10-CM | POA: Diagnosis not present

## 2024-02-02 DIAGNOSIS — H3582 Retinal ischemia: Secondary | ICD-10-CM | POA: Diagnosis not present

## 2024-02-07 DIAGNOSIS — I10 Essential (primary) hypertension: Secondary | ICD-10-CM | POA: Diagnosis not present

## 2024-02-07 DIAGNOSIS — E1165 Type 2 diabetes mellitus with hyperglycemia: Secondary | ICD-10-CM | POA: Diagnosis not present

## 2024-02-07 DIAGNOSIS — E559 Vitamin D deficiency, unspecified: Secondary | ICD-10-CM | POA: Diagnosis not present

## 2024-02-09 DIAGNOSIS — M9905 Segmental and somatic dysfunction of pelvic region: Secondary | ICD-10-CM | POA: Diagnosis not present

## 2024-02-09 DIAGNOSIS — M9902 Segmental and somatic dysfunction of thoracic region: Secondary | ICD-10-CM | POA: Diagnosis not present

## 2024-02-09 DIAGNOSIS — M9901 Segmental and somatic dysfunction of cervical region: Secondary | ICD-10-CM | POA: Diagnosis not present

## 2024-02-09 DIAGNOSIS — M9903 Segmental and somatic dysfunction of lumbar region: Secondary | ICD-10-CM | POA: Diagnosis not present

## 2024-02-09 DIAGNOSIS — M542 Cervicalgia: Secondary | ICD-10-CM | POA: Diagnosis not present

## 2024-02-09 DIAGNOSIS — M4316 Spondylolisthesis, lumbar region: Secondary | ICD-10-CM | POA: Diagnosis not present

## 2024-02-09 DIAGNOSIS — M546 Pain in thoracic spine: Secondary | ICD-10-CM | POA: Diagnosis not present

## 2024-02-11 DIAGNOSIS — E782 Mixed hyperlipidemia: Secondary | ICD-10-CM | POA: Diagnosis not present

## 2024-02-11 DIAGNOSIS — E559 Vitamin D deficiency, unspecified: Secondary | ICD-10-CM | POA: Diagnosis not present

## 2024-02-11 DIAGNOSIS — Z6830 Body mass index (BMI) 30.0-30.9, adult: Secondary | ICD-10-CM | POA: Diagnosis not present

## 2024-02-11 DIAGNOSIS — I10 Essential (primary) hypertension: Secondary | ICD-10-CM | POA: Diagnosis not present

## 2024-02-11 DIAGNOSIS — E1165 Type 2 diabetes mellitus with hyperglycemia: Secondary | ICD-10-CM | POA: Diagnosis not present

## 2024-02-11 DIAGNOSIS — E6609 Other obesity due to excess calories: Secondary | ICD-10-CM | POA: Diagnosis not present

## 2024-02-11 DIAGNOSIS — L659 Nonscarring hair loss, unspecified: Secondary | ICD-10-CM | POA: Diagnosis not present

## 2024-02-11 DIAGNOSIS — K5900 Constipation, unspecified: Secondary | ICD-10-CM | POA: Diagnosis not present

## 2024-02-11 DIAGNOSIS — R252 Cramp and spasm: Secondary | ICD-10-CM | POA: Diagnosis not present

## 2024-02-11 DIAGNOSIS — M519 Unspecified thoracic, thoracolumbar and lumbosacral intervertebral disc disorder: Secondary | ICD-10-CM | POA: Diagnosis not present

## 2024-02-11 DIAGNOSIS — I739 Peripheral vascular disease, unspecified: Secondary | ICD-10-CM | POA: Diagnosis not present

## 2024-02-17 DIAGNOSIS — H43812 Vitreous degeneration, left eye: Secondary | ICD-10-CM | POA: Diagnosis not present

## 2024-02-17 DIAGNOSIS — H3563 Retinal hemorrhage, bilateral: Secondary | ICD-10-CM | POA: Diagnosis not present

## 2024-02-17 DIAGNOSIS — H35033 Hypertensive retinopathy, bilateral: Secondary | ICD-10-CM | POA: Diagnosis not present

## 2024-02-17 DIAGNOSIS — E113593 Type 2 diabetes mellitus with proliferative diabetic retinopathy without macular edema, bilateral: Secondary | ICD-10-CM | POA: Diagnosis not present

## 2024-02-17 DIAGNOSIS — H43823 Vitreomacular adhesion, bilateral: Secondary | ICD-10-CM | POA: Diagnosis not present

## 2024-03-08 DIAGNOSIS — M9903 Segmental and somatic dysfunction of lumbar region: Secondary | ICD-10-CM | POA: Diagnosis not present

## 2024-03-08 DIAGNOSIS — M9902 Segmental and somatic dysfunction of thoracic region: Secondary | ICD-10-CM | POA: Diagnosis not present

## 2024-03-08 DIAGNOSIS — M9905 Segmental and somatic dysfunction of pelvic region: Secondary | ICD-10-CM | POA: Diagnosis not present

## 2024-03-08 DIAGNOSIS — M9901 Segmental and somatic dysfunction of cervical region: Secondary | ICD-10-CM | POA: Diagnosis not present

## 2024-03-08 DIAGNOSIS — M546 Pain in thoracic spine: Secondary | ICD-10-CM | POA: Diagnosis not present

## 2024-03-08 DIAGNOSIS — M542 Cervicalgia: Secondary | ICD-10-CM | POA: Diagnosis not present

## 2024-03-08 DIAGNOSIS — M4316 Spondylolisthesis, lumbar region: Secondary | ICD-10-CM | POA: Diagnosis not present

## 2024-04-05 DIAGNOSIS — M9905 Segmental and somatic dysfunction of pelvic region: Secondary | ICD-10-CM | POA: Diagnosis not present

## 2024-04-05 DIAGNOSIS — M9903 Segmental and somatic dysfunction of lumbar region: Secondary | ICD-10-CM | POA: Diagnosis not present

## 2024-04-05 DIAGNOSIS — M9901 Segmental and somatic dysfunction of cervical region: Secondary | ICD-10-CM | POA: Diagnosis not present

## 2024-04-05 DIAGNOSIS — M546 Pain in thoracic spine: Secondary | ICD-10-CM | POA: Diagnosis not present

## 2024-04-05 DIAGNOSIS — M9902 Segmental and somatic dysfunction of thoracic region: Secondary | ICD-10-CM | POA: Diagnosis not present

## 2024-04-05 DIAGNOSIS — M542 Cervicalgia: Secondary | ICD-10-CM | POA: Diagnosis not present

## 2024-04-05 DIAGNOSIS — M4316 Spondylolisthesis, lumbar region: Secondary | ICD-10-CM | POA: Diagnosis not present

## 2024-05-03 DIAGNOSIS — M9903 Segmental and somatic dysfunction of lumbar region: Secondary | ICD-10-CM | POA: Diagnosis not present

## 2024-05-03 DIAGNOSIS — M9902 Segmental and somatic dysfunction of thoracic region: Secondary | ICD-10-CM | POA: Diagnosis not present

## 2024-05-03 DIAGNOSIS — M4316 Spondylolisthesis, lumbar region: Secondary | ICD-10-CM | POA: Diagnosis not present

## 2024-05-03 DIAGNOSIS — M546 Pain in thoracic spine: Secondary | ICD-10-CM | POA: Diagnosis not present

## 2024-05-03 DIAGNOSIS — M9905 Segmental and somatic dysfunction of pelvic region: Secondary | ICD-10-CM | POA: Diagnosis not present

## 2024-05-12 ENCOUNTER — Telehealth: Payer: Self-pay

## 2024-05-12 DIAGNOSIS — M9903 Segmental and somatic dysfunction of lumbar region: Secondary | ICD-10-CM | POA: Diagnosis not present

## 2024-05-12 DIAGNOSIS — M9902 Segmental and somatic dysfunction of thoracic region: Secondary | ICD-10-CM | POA: Diagnosis not present

## 2024-05-12 DIAGNOSIS — M546 Pain in thoracic spine: Secondary | ICD-10-CM | POA: Diagnosis not present

## 2024-05-12 DIAGNOSIS — M4316 Spondylolisthesis, lumbar region: Secondary | ICD-10-CM | POA: Diagnosis not present

## 2024-05-12 DIAGNOSIS — M9905 Segmental and somatic dysfunction of pelvic region: Secondary | ICD-10-CM | POA: Diagnosis not present

## 2024-05-12 NOTE — Telephone Encounter (Signed)
 Up to date on meds, following up frequently. Will review with visits

## 2024-05-22 DIAGNOSIS — R252 Cramp and spasm: Secondary | ICD-10-CM | POA: Diagnosis not present

## 2024-05-22 DIAGNOSIS — I10 Essential (primary) hypertension: Secondary | ICD-10-CM | POA: Diagnosis not present

## 2024-05-22 DIAGNOSIS — E1165 Type 2 diabetes mellitus with hyperglycemia: Secondary | ICD-10-CM | POA: Diagnosis not present

## 2024-05-26 DIAGNOSIS — I10 Essential (primary) hypertension: Secondary | ICD-10-CM | POA: Diagnosis not present

## 2024-05-26 DIAGNOSIS — E782 Mixed hyperlipidemia: Secondary | ICD-10-CM | POA: Diagnosis not present

## 2024-05-26 DIAGNOSIS — E1151 Type 2 diabetes mellitus with diabetic peripheral angiopathy without gangrene: Secondary | ICD-10-CM | POA: Diagnosis not present

## 2024-05-26 DIAGNOSIS — I739 Peripheral vascular disease, unspecified: Secondary | ICD-10-CM | POA: Diagnosis not present

## 2024-05-26 DIAGNOSIS — K5900 Constipation, unspecified: Secondary | ICD-10-CM | POA: Diagnosis not present

## 2024-05-26 DIAGNOSIS — L659 Nonscarring hair loss, unspecified: Secondary | ICD-10-CM | POA: Diagnosis not present

## 2024-05-26 DIAGNOSIS — E1165 Type 2 diabetes mellitus with hyperglycemia: Secondary | ICD-10-CM | POA: Diagnosis not present

## 2024-05-26 DIAGNOSIS — E669 Obesity, unspecified: Secondary | ICD-10-CM | POA: Diagnosis not present

## 2024-05-26 DIAGNOSIS — E559 Vitamin D deficiency, unspecified: Secondary | ICD-10-CM | POA: Diagnosis not present

## 2024-05-26 DIAGNOSIS — G720 Drug-induced myopathy: Secondary | ICD-10-CM | POA: Diagnosis not present

## 2024-05-26 DIAGNOSIS — M519 Unspecified thoracic, thoracolumbar and lumbosacral intervertebral disc disorder: Secondary | ICD-10-CM | POA: Diagnosis not present

## 2024-05-26 DIAGNOSIS — R252 Cramp and spasm: Secondary | ICD-10-CM | POA: Diagnosis not present

## 2024-05-31 DIAGNOSIS — M4316 Spondylolisthesis, lumbar region: Secondary | ICD-10-CM | POA: Diagnosis not present

## 2024-05-31 DIAGNOSIS — M9903 Segmental and somatic dysfunction of lumbar region: Secondary | ICD-10-CM | POA: Diagnosis not present

## 2024-05-31 DIAGNOSIS — M9901 Segmental and somatic dysfunction of cervical region: Secondary | ICD-10-CM | POA: Diagnosis not present

## 2024-05-31 DIAGNOSIS — M9905 Segmental and somatic dysfunction of pelvic region: Secondary | ICD-10-CM | POA: Diagnosis not present

## 2024-05-31 DIAGNOSIS — M542 Cervicalgia: Secondary | ICD-10-CM | POA: Diagnosis not present

## 2024-05-31 DIAGNOSIS — M546 Pain in thoracic spine: Secondary | ICD-10-CM | POA: Diagnosis not present

## 2024-05-31 DIAGNOSIS — M9902 Segmental and somatic dysfunction of thoracic region: Secondary | ICD-10-CM | POA: Diagnosis not present

## 2024-06-01 DIAGNOSIS — E113593 Type 2 diabetes mellitus with proliferative diabetic retinopathy without macular edema, bilateral: Secondary | ICD-10-CM | POA: Diagnosis not present

## 2024-06-01 DIAGNOSIS — H43812 Vitreous degeneration, left eye: Secondary | ICD-10-CM | POA: Diagnosis not present

## 2024-06-01 DIAGNOSIS — H3563 Retinal hemorrhage, bilateral: Secondary | ICD-10-CM | POA: Diagnosis not present

## 2024-06-01 DIAGNOSIS — H35033 Hypertensive retinopathy, bilateral: Secondary | ICD-10-CM | POA: Diagnosis not present

## 2024-06-01 DIAGNOSIS — H43823 Vitreomacular adhesion, bilateral: Secondary | ICD-10-CM | POA: Diagnosis not present

## 2024-06-06 DIAGNOSIS — Z1211 Encounter for screening for malignant neoplasm of colon: Secondary | ICD-10-CM | POA: Diagnosis not present

## 2024-06-13 LAB — COLOGUARD: COLOGUARD: NEGATIVE

## 2024-06-28 DIAGNOSIS — M9903 Segmental and somatic dysfunction of lumbar region: Secondary | ICD-10-CM | POA: Diagnosis not present

## 2024-06-28 DIAGNOSIS — M542 Cervicalgia: Secondary | ICD-10-CM | POA: Diagnosis not present

## 2024-06-28 DIAGNOSIS — M9901 Segmental and somatic dysfunction of cervical region: Secondary | ICD-10-CM | POA: Diagnosis not present

## 2024-06-28 DIAGNOSIS — M9905 Segmental and somatic dysfunction of pelvic region: Secondary | ICD-10-CM | POA: Diagnosis not present

## 2024-06-28 DIAGNOSIS — M546 Pain in thoracic spine: Secondary | ICD-10-CM | POA: Diagnosis not present

## 2024-06-28 DIAGNOSIS — M4316 Spondylolisthesis, lumbar region: Secondary | ICD-10-CM | POA: Diagnosis not present

## 2024-06-28 DIAGNOSIS — M9902 Segmental and somatic dysfunction of thoracic region: Secondary | ICD-10-CM | POA: Diagnosis not present

## 2024-07-26 DIAGNOSIS — M9905 Segmental and somatic dysfunction of pelvic region: Secondary | ICD-10-CM | POA: Diagnosis not present

## 2024-07-26 DIAGNOSIS — M4316 Spondylolisthesis, lumbar region: Secondary | ICD-10-CM | POA: Diagnosis not present

## 2024-07-26 DIAGNOSIS — M9903 Segmental and somatic dysfunction of lumbar region: Secondary | ICD-10-CM | POA: Diagnosis not present

## 2024-07-26 DIAGNOSIS — M9901 Segmental and somatic dysfunction of cervical region: Secondary | ICD-10-CM | POA: Diagnosis not present

## 2024-07-26 DIAGNOSIS — M546 Pain in thoracic spine: Secondary | ICD-10-CM | POA: Diagnosis not present

## 2024-07-26 DIAGNOSIS — M9902 Segmental and somatic dysfunction of thoracic region: Secondary | ICD-10-CM | POA: Diagnosis not present

## 2024-07-26 DIAGNOSIS — M542 Cervicalgia: Secondary | ICD-10-CM | POA: Diagnosis not present

## 2024-08-25 DIAGNOSIS — M9903 Segmental and somatic dysfunction of lumbar region: Secondary | ICD-10-CM | POA: Diagnosis not present

## 2024-08-25 DIAGNOSIS — M9905 Segmental and somatic dysfunction of pelvic region: Secondary | ICD-10-CM | POA: Diagnosis not present

## 2024-08-25 DIAGNOSIS — M9902 Segmental and somatic dysfunction of thoracic region: Secondary | ICD-10-CM | POA: Diagnosis not present

## 2024-08-25 DIAGNOSIS — M546 Pain in thoracic spine: Secondary | ICD-10-CM | POA: Diagnosis not present

## 2024-08-25 DIAGNOSIS — M542 Cervicalgia: Secondary | ICD-10-CM | POA: Diagnosis not present

## 2024-08-25 DIAGNOSIS — M9901 Segmental and somatic dysfunction of cervical region: Secondary | ICD-10-CM | POA: Diagnosis not present

## 2024-08-25 DIAGNOSIS — M4316 Spondylolisthesis, lumbar region: Secondary | ICD-10-CM | POA: Diagnosis not present

## 2024-08-28 DIAGNOSIS — E1165 Type 2 diabetes mellitus with hyperglycemia: Secondary | ICD-10-CM | POA: Diagnosis not present

## 2024-08-28 DIAGNOSIS — I10 Essential (primary) hypertension: Secondary | ICD-10-CM | POA: Diagnosis not present

## 2024-09-04 DIAGNOSIS — E119 Type 2 diabetes mellitus without complications: Secondary | ICD-10-CM | POA: Diagnosis not present

## 2024-09-04 DIAGNOSIS — I739 Peripheral vascular disease, unspecified: Secondary | ICD-10-CM | POA: Diagnosis not present

## 2024-09-04 DIAGNOSIS — K5900 Constipation, unspecified: Secondary | ICD-10-CM | POA: Diagnosis not present

## 2024-09-04 DIAGNOSIS — M519 Unspecified thoracic, thoracolumbar and lumbosacral intervertebral disc disorder: Secondary | ICD-10-CM | POA: Diagnosis not present

## 2024-09-04 DIAGNOSIS — R252 Cramp and spasm: Secondary | ICD-10-CM | POA: Diagnosis not present

## 2024-09-04 DIAGNOSIS — I10 Essential (primary) hypertension: Secondary | ICD-10-CM | POA: Diagnosis not present

## 2024-09-04 DIAGNOSIS — L659 Nonscarring hair loss, unspecified: Secondary | ICD-10-CM | POA: Diagnosis not present

## 2024-09-04 DIAGNOSIS — E1165 Type 2 diabetes mellitus with hyperglycemia: Secondary | ICD-10-CM | POA: Diagnosis not present

## 2024-09-04 DIAGNOSIS — E559 Vitamin D deficiency, unspecified: Secondary | ICD-10-CM | POA: Diagnosis not present

## 2024-09-04 DIAGNOSIS — E6609 Other obesity due to excess calories: Secondary | ICD-10-CM | POA: Diagnosis not present

## 2024-09-04 DIAGNOSIS — E782 Mixed hyperlipidemia: Secondary | ICD-10-CM | POA: Diagnosis not present

## 2024-09-04 DIAGNOSIS — G720 Drug-induced myopathy: Secondary | ICD-10-CM | POA: Diagnosis not present

## 2024-09-22 DIAGNOSIS — M542 Cervicalgia: Secondary | ICD-10-CM | POA: Diagnosis not present

## 2024-09-22 DIAGNOSIS — M4316 Spondylolisthesis, lumbar region: Secondary | ICD-10-CM | POA: Diagnosis not present

## 2024-09-22 DIAGNOSIS — M9901 Segmental and somatic dysfunction of cervical region: Secondary | ICD-10-CM | POA: Diagnosis not present

## 2024-09-22 DIAGNOSIS — M546 Pain in thoracic spine: Secondary | ICD-10-CM | POA: Diagnosis not present

## 2024-09-22 DIAGNOSIS — M9905 Segmental and somatic dysfunction of pelvic region: Secondary | ICD-10-CM | POA: Diagnosis not present

## 2024-09-22 DIAGNOSIS — M9903 Segmental and somatic dysfunction of lumbar region: Secondary | ICD-10-CM | POA: Diagnosis not present

## 2024-09-22 DIAGNOSIS — M9902 Segmental and somatic dysfunction of thoracic region: Secondary | ICD-10-CM | POA: Diagnosis not present
